# Patient Record
Sex: Male | Born: 1955
Health system: Southern US, Community
[De-identification: ages and names within clinical notes are randomized; demographics above are authoritative.]

## PROBLEM LIST (undated history)

## (undated) DIAGNOSIS — K219 Gastro-esophageal reflux disease without esophagitis: Secondary | ICD-10-CM

## (undated) DIAGNOSIS — L309 Dermatitis, unspecified: Secondary | ICD-10-CM

## (undated) DIAGNOSIS — G20 Parkinson's disease: Secondary | ICD-10-CM

## (undated) DIAGNOSIS — C801 Malignant (primary) neoplasm, unspecified: Secondary | ICD-10-CM

## (undated) DIAGNOSIS — K921 Melena: Secondary | ICD-10-CM

## (undated) DIAGNOSIS — G20A1 Parkinson's disease without dyskinesia, without mention of fluctuations: Secondary | ICD-10-CM

## (undated) DIAGNOSIS — T7840XA Allergy, unspecified, initial encounter: Secondary | ICD-10-CM

## (undated) HISTORY — PX: CATARACT EXTRACTION: SUR2

## (undated) HISTORY — DX: Melena: K92.1

## (undated) HISTORY — DX: Allergy, unspecified, initial encounter: T78.40XA

## (undated) HISTORY — DX: Dermatitis, unspecified: L30.9

## (undated) HISTORY — DX: Malignant (primary) neoplasm, unspecified: C80.1

---
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
## 1984-11-26 HISTORY — PX: HEMORROIDECTOMY: SUR656

## 2003-10-05 ENCOUNTER — Emergency Department (HOSPITAL_COMMUNITY): Admission: EM | Admit: 2003-10-05 | Discharge: 2003-10-05 | Payer: Self-pay | Admitting: Emergency Medicine

## 2009-11-26 HISTORY — PX: PROSTATE SURGERY: SHX751

## 2010-11-26 DIAGNOSIS — C801 Malignant (primary) neoplasm, unspecified: Secondary | ICD-10-CM

## 2010-11-26 HISTORY — DX: Malignant (primary) neoplasm, unspecified: C80.1

## 2011-12-21 ENCOUNTER — Encounter: Payer: Self-pay | Admitting: Family Medicine

## 2011-12-21 ENCOUNTER — Ambulatory Visit (INDEPENDENT_AMBULATORY_CARE_PROVIDER_SITE_OTHER): Payer: 59 | Admitting: Family Medicine

## 2011-12-21 VITALS — BP 100/68 | HR 60 | Temp 97.8°F | Resp 12 | Ht 70.0 in | Wt 218.0 lb

## 2011-12-21 DIAGNOSIS — Z0000 Encounter for general adult medical examination without abnormal findings: Secondary | ICD-10-CM

## 2011-12-21 DIAGNOSIS — Z8546 Personal history of malignant neoplasm of prostate: Secondary | ICD-10-CM

## 2011-12-21 LAB — POCT URINALYSIS DIPSTICK
Bilirubin, UA: NEGATIVE
Blood, UA: NEGATIVE
Glucose, UA: NEGATIVE
Ketones, UA: NEGATIVE
Leukocytes, UA: NEGATIVE
Nitrite, UA: NEGATIVE
Protein, UA: NEGATIVE
Spec Grav, UA: 1.025
Urobilinogen, UA: 0.2
pH, UA: 6

## 2011-12-21 LAB — LIPID PANEL
Cholesterol: 204 mg/dL — ABNORMAL HIGH (ref 0–200)
HDL: 42.4 mg/dL (ref >39.00)
Total CHOL/HDL Ratio: 5
Triglycerides: 160 mg/dL — ABNORMAL HIGH (ref 0.0–149.0)
VLDL: 32 mg/dL (ref 0.0–40.0)

## 2011-12-21 LAB — CBC WITH DIFFERENTIAL/PLATELET
Basophils Absolute: 0 10*3/uL (ref 0.0–0.1)
Basophils Relative: 0.4 % (ref 0.0–3.0)
Eosinophils Absolute: 0.1 10*3/uL (ref 0.0–0.7)
Eosinophils Relative: 0.9 % (ref 0.0–5.0)
HCT: 44.2 % (ref 39.0–52.0)
Hemoglobin: 15.5 g/dL (ref 13.0–17.0)
Lymphocytes Relative: 30.9 % (ref 12.0–46.0)
Lymphs Abs: 1.8 10*3/uL (ref 0.7–4.0)
MCHC: 35.2 g/dL (ref 30.0–36.0)
MCV: 93.6 fl (ref 78.0–100.0)
Monocytes Absolute: 0.5 10*3/uL (ref 0.1–1.0)
Monocytes Relative: 7.9 % (ref 3.0–12.0)
Neutro Abs: 3.6 10*3/uL (ref 1.4–7.7)
Neutrophils Relative %: 59.9 % (ref 43.0–77.0)
Platelets: 246 10*3/uL (ref 150.0–400.0)
RBC: 4.72 Mil/uL (ref 4.22–5.81)
RDW: 13.4 % (ref 11.5–14.6)
WBC: 6 10*3/uL (ref 4.5–10.5)

## 2011-12-21 LAB — BASIC METABOLIC PANEL
BUN: 17 mg/dL (ref 6–23)
CO2: 30 mEq/L (ref 19–32)
Calcium: 9.2 mg/dL (ref 8.4–10.5)
Chloride: 103 mEq/L (ref 96–112)
Creatinine, Ser: 1 mg/dL (ref 0.4–1.5)
GFR: 85.06 mL/min (ref >60.00)
Glucose, Bld: 101 mg/dL — ABNORMAL HIGH (ref 70–99)
Potassium: 4.1 mEq/L (ref 3.5–5.1)
Sodium: 139 mEq/L (ref 135–145)

## 2011-12-21 LAB — HEPATIC FUNCTION PANEL
ALT: 38 U/L (ref 0–53)
AST: 24 U/L (ref 0–37)
Albumin: 4.1 g/dL (ref 3.5–5.2)
Alkaline Phosphatase: 54 U/L (ref 39–117)
Bilirubin, Direct: 0 mg/dL (ref 0.0–0.3)
Total Bilirubin: 1 mg/dL (ref 0.3–1.2)
Total Protein: 7.1 g/dL (ref 6.0–8.3)

## 2011-12-21 LAB — TSH: TSH: 0.97 u[IU]/mL (ref 0.35–5.50)

## 2011-12-21 LAB — LDL CHOLESTEROL, DIRECT: Direct LDL: 133.6 mg/dL

## 2011-12-21 NOTE — Progress Notes (Signed)
  Subjective:    Patient ID: James Collier, male    DOB: [birth year], 56 y.o.   MRN: [id number]  HPI  New patient to establish care for complete physical. Past medical history reviewed. History prostate cancer diagnosed 2011. Status post prostatectomy. Followed by urology. No other chronic medical problems. Remote history of hemorrhoidal surgery. Takes no medications. Nonsmoker. Occasional alcohol use.  Family history is noted for mother died of pancreatic cancer. Father with question of psoriatic arthritis.  Patient is married. Pharmacist, hospital. No smoking history as above. No consistent exercise.  Past Medical History  Diagnosis Date  . Blood in stool   . Cancer 2012    prostate  . Allergy    Past Surgical History  Procedure Date  . Tonsillectomy 1965  . Prostate surgery 2011  . Hemorroidectomy 1986    reports that he has never smoked. He does not have any smokeless tobacco history on file. His alcohol and drug histories not on file. family history includes Arthritis in his father and Cancer in his maternal aunt, mother, and paternal grandmother. Allergies  Allergen Reactions  . Aloe     rash     Review of Systems  Constitutional: Negative for fever, activity change, appetite change and fatigue.  HENT: Negative for ear pain, congestion and trouble swallowing.   Eyes: Negative for pain and visual disturbance.  Respiratory: Negative for cough, shortness of breath and wheezing.   Cardiovascular: Negative for chest pain and palpitations.  Gastrointestinal: Negative for nausea, vomiting, abdominal pain, diarrhea, constipation, blood in stool, abdominal distention and rectal pain.  Genitourinary: Negative for dysuria, hematuria and testicular pain.  Musculoskeletal: Negative for joint swelling and arthralgias.  Skin: Negative for rash.  Neurological: Negative for dizziness, syncope and headaches.  Hematological: Negative for adenopathy.  Psychiatric/Behavioral: Negative for  confusion and dysphoric mood.       Objective:   Physical Exam  Constitutional: He is oriented to person, place, and time. He appears well-developed and well-nourished. No distress.  HENT:  Head: Normocephalic and atraumatic.  Right Ear: External ear normal.  Left Ear: External ear normal.  Mouth/Throat: Oropharynx is clear and moist.  Eyes: Conjunctivae and EOM are normal. Pupils are equal, round, and reactive to light.  Neck: Normal range of motion. Neck supple. No thyromegaly present.  Cardiovascular: Normal rate, regular rhythm and normal heart sounds.   No murmur heard. Pulmonary/Chest: No respiratory distress. He has no wheezes. He has no rales.  Abdominal: Soft. Bowel sounds are normal. He exhibits no distension and no mass. There is no tenderness. There is no rebound and no guarding.  Genitourinary: Rectum normal.       Previous prostatectomy  Musculoskeletal: He exhibits no edema.  Lymphadenopathy:    He has no cervical adenopathy.  Neurological: He is alert and oriented to person, place, and time. He displays normal reflexes. No cranial nerve deficit.  Skin: No rash noted.  Psychiatric: He has a normal mood and affect.          Assessment & Plan:  Complete physical. Patient has history of prostate cancer followed by urology so we'll not check PSA today. Confirm date of last tetanus. Colonoscopy recommended and patient not ready this time. Obtain screening lab work. Establish more consistent exercise.

## 2011-12-21 NOTE — Patient Instructions (Signed)
Consider colonoscopy later this year.

## 2011-12-25 NOTE — Progress Notes (Signed)
Quick Note:  Pt informed on personally identified VM ______ 

## 2012-05-24 ENCOUNTER — Encounter (HOSPITAL_BASED_OUTPATIENT_CLINIC_OR_DEPARTMENT_OTHER): Payer: Self-pay | Admitting: *Deleted

## 2012-05-24 ENCOUNTER — Emergency Department (HOSPITAL_BASED_OUTPATIENT_CLINIC_OR_DEPARTMENT_OTHER): Payer: 59

## 2012-05-24 ENCOUNTER — Emergency Department (HOSPITAL_BASED_OUTPATIENT_CLINIC_OR_DEPARTMENT_OTHER)
Admission: EM | Admit: 2012-05-24 | Discharge: 2012-05-24 | Disposition: A | Discharge status: Home / Self Care | Payer: 59 | Attending: Emergency Medicine | Admitting: Emergency Medicine

## 2012-05-24 DIAGNOSIS — R109 Unspecified abdominal pain: Secondary | ICD-10-CM | POA: Insufficient documentation

## 2012-05-24 DIAGNOSIS — R079 Chest pain, unspecified: Secondary | ICD-10-CM | POA: Insufficient documentation

## 2012-05-24 DIAGNOSIS — Y999 Unspecified external cause status: Secondary | ICD-10-CM | POA: Insufficient documentation

## 2012-05-24 DIAGNOSIS — S2249XA Multiple fractures of ribs, unspecified side, initial encounter for closed fracture: Secondary | ICD-10-CM | POA: Insufficient documentation

## 2012-05-24 DIAGNOSIS — R1011 Right upper quadrant pain: Secondary | ICD-10-CM | POA: Insufficient documentation

## 2012-05-24 DIAGNOSIS — S2239XA Fracture of one rib, unspecified side, initial encounter for closed fracture: Secondary | ICD-10-CM

## 2012-05-24 LAB — CBC WITH DIFFERENTIAL/PLATELET
Basophils Absolute: 0 10*3/uL (ref 0.0–0.1)
Basophils Relative: 0 % (ref 0–1)
Eosinophils Absolute: 0 10*3/uL (ref 0.0–0.7)
Eosinophils Relative: 0 % (ref 0–5)
HCT: 40.6 % (ref 39.0–52.0)
Hemoglobin: 14.7 g/dL (ref 13.0–17.0)
Lymphocytes Relative: 18 % (ref 12–46)
Lymphs Abs: 1.9 10*3/uL (ref 0.7–4.0)
MCH: 31.9 pg (ref 26.0–34.0)
MCHC: 36.2 g/dL — ABNORMAL HIGH (ref 30.0–36.0)
MCV: 88.1 fL (ref 78.0–100.0)
Monocytes Absolute: 0.9 10*3/uL (ref 0.1–1.0)
Monocytes Relative: 9 % (ref 3–12)
Neutro Abs: 7.3 10*3/uL (ref 1.7–7.7)
Neutrophils Relative %: 73 % (ref 43–77)
Platelets: 235 10*3/uL (ref 150–400)
RBC: 4.61 MIL/uL (ref 4.22–5.81)
RDW: 12.8 % (ref 11.5–15.5)
WBC: 10.1 10*3/uL (ref 4.0–10.5)

## 2012-05-24 LAB — BASIC METABOLIC PANEL
BUN: 16 mg/dL (ref 6–23)
CO2: 29 mEq/L (ref 19–32)
Calcium: 10 mg/dL (ref 8.4–10.5)
Chloride: 100 mEq/L (ref 96–112)
Creatinine, Ser: 1.3 mg/dL (ref 0.50–1.35)
GFR calc Af Amer: 69 mL/min — ABNORMAL LOW (ref >90)
GFR calc non Af Amer: 60 mL/min — ABNORMAL LOW (ref >90)
Glucose, Bld: 93 mg/dL (ref 70–99)
Potassium: 3.9 mEq/L (ref 3.5–5.1)
Sodium: 138 mEq/L (ref 135–145)

## 2012-05-24 MED ORDER — IBUPROFEN 800 MG PO TABS: 800.0000 mg | ORAL_TABLET | Freq: Three times a day (TID) | ORAL | Status: AC

## 2012-05-24 MED ORDER — ONDANSETRON HCL 4 MG/2ML IJ SOLN
4.0000 mg | Freq: Once | INTRAMUSCULAR | Status: AC
  Administered 2012-05-24: 4 mg via INTRAVENOUS
  Filled 2012-05-24: qty 2

## 2012-05-24 MED ORDER — OXYCODONE-ACETAMINOPHEN 5-325 MG PO TABS
1.0000 | ORAL_TABLET | Freq: Once | ORAL | Status: AC
  Administered 2012-05-24: 1 via ORAL
  Filled 2012-05-24: qty 1

## 2012-05-24 MED ORDER — IOHEXOL 300 MG/ML  SOLN
100.0000 mL | Freq: Once | INTRAMUSCULAR | Status: AC | PRN
  Administered 2012-05-24: 100 mL via INTRAVENOUS

## 2012-05-24 MED ORDER — ONDANSETRON 8 MG PO TBDP: 8.0000 mg | ORAL_TABLET | Freq: Three times a day (TID) | ORAL | Status: AC | PRN

## 2012-05-24 MED ORDER — SODIUM CHLORIDE 0.9 % IV SOLN
INTRAVENOUS | Status: DC
  Administered 2012-05-24: 20:00:00 via INTRAVENOUS

## 2012-05-24 MED ORDER — OXYCODONE-ACETAMINOPHEN 5-325 MG PO TABS: 1.0000 | ORAL_TABLET | ORAL | Status: AC | PRN

## 2012-05-24 MED ORDER — HYDROMORPHONE HCL PF 1 MG/ML IJ SOLN
1.0000 mg | Freq: Once | INTRAMUSCULAR | Status: AC
  Administered 2012-05-24: 1 mg via INTRAVENOUS
  Filled 2012-05-24: qty 1
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
## 2012-05-24 NOTE — Discharge Instructions (Signed)
   Rib Fracture Your caregiver has diagnosed you as having a rib fracture (a break). This can occur by a blow to the chest, by a fall against a hard object, or by violent coughing or sneezing. There may be one or many breaks. Rib fractures may heal on their own within 3 to 8 weeks. The longer healing period is usually associated with a continued cough or other aggravating activities. HOME CARE INSTRUCTIONS   Avoid strenuous activity. Be careful during activities and avoid bumping the injured rib. Activities that cause pain pull on the fracture site(s) and are best avoided if possible.   Eat a normal, well-balanced diet. Drink plenty of fluids to avoid constipation.   Take deep breaths several times a day to keep lungs free of infection. Try to cough several times a day, splinting the injured area with a pillow. This will help prevent pneumonia.   Do not wear a rib belt or binder. These restrict breathing which can lead to pneumonia.   Only take over-the-counter or prescription medicines for pain, discomfort, or fever as directed by your caregiver.  SEEK MEDICAL CARE IF:  You develop a continual cough, associated with thick or bloody sputum. SEEK IMMEDIATE MEDICAL CARE IF:   You have a fever.   You have difficulty breathing.   You have nausea (feeling sick to your stomach), vomiting, or abdominal (belly) pain.   You have worsening pain, not controlled with medications.  Document Released: 11/12/2005 Document Revised: 11/01/2011 Document Reviewed: 04/16/2007 [facility] Patient Information 2012 [place], [place].Rib Fracture Your caregiver has diagnosed you as having a rib fracture (a break). This can occur by a blow to the chest, by a fall against a hard object, or by violent coughing or sneezing. There may be one or many breaks. Rib fractures may heal on their own within 3 to 8 weeks. The longer healing period is usually associated with a continued cough or other aggravating activities. HOME  CARE INSTRUCTIONS   Avoid strenuous activity. Be careful during activities and avoid bumping the injured rib. Activities that cause pain pull on the fracture site(s) and are best avoided if possible.   Eat a normal, well-balanced diet. Drink plenty of fluids to avoid constipation.   Take deep breaths several times a day to keep lungs free of infection. Try to cough several times a day, splinting the injured area with a pillow. This will help prevent pneumonia.   Do not wear a rib belt or binder. These restrict breathing which can lead to pneumonia.   Only take over-the-counter or prescription medicines for pain, discomfort, or fever as directed by your caregiver.  SEEK MEDICAL CARE IF:  You develop a continual cough, associated with thick or bloody sputum. SEEK IMMEDIATE MEDICAL CARE IF:   You have a fever.   You have difficulty breathing.   You have nausea (feeling sick to your stomach), vomiting, or abdominal (belly) pain.   You have worsening pain, not controlled with medications.  Document Released: 11/12/2005 Document Revised: 11/01/2011 Document Reviewed: 04/16/2007 [facility] Patient Information 2012 [place], [place].   Please take pain medicine as needed and use incentive spirometer as needed.  Recheck with Dr. Caryl Never this week.
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
## 2012-05-27 ENCOUNTER — Telehealth (HOSPITAL_BASED_OUTPATIENT_CLINIC_OR_DEPARTMENT_OTHER): Payer: Self-pay | Admitting: *Deleted

## 2012-05-27 NOTE — ED Notes (Signed)
Patient called requesting a note to be out of work.  Chart reviewed by Dr. Algis Downs. Judd Lien.  OK to give patient 7 days off and will need to follow up with his Primary Care doctor for additional time off and clearance to return to work.

## 2012-05-30 ENCOUNTER — Encounter: Payer: Self-pay | Admitting: Family Medicine

## 2012-05-30 ENCOUNTER — Ambulatory Visit (INDEPENDENT_AMBULATORY_CARE_PROVIDER_SITE_OTHER): Payer: 59 | Admitting: Family Medicine

## 2012-05-30 VITALS — BP 130/80 | Temp 98.1°F | Wt 228.0 lb

## 2012-05-30 DIAGNOSIS — S2249XA Multiple fractures of ribs, unspecified side, initial encounter for closed fracture: Secondary | ICD-10-CM

## 2012-05-30 NOTE — Patient Instructions (Addendum)
Rib Fracture Your caregiver has diagnosed you as having a rib fracture (a break). This can occur by a blow to the chest, by a fall against a hard object, or by violent coughing or sneezing. There may be one or many breaks. Rib fractures may heal on their own within 3 to 8 weeks. The longer healing period is usually associated with a continued cough or other aggravating activities. HOME CARE INSTRUCTIONS   Avoid strenuous activity. Be careful during activities and avoid bumping the injured rib. Activities that cause pain pull on the fracture site(s) and are best avoided if possible.   Eat a normal, well-balanced diet. Drink plenty of fluids to avoid constipation.   Take deep breaths several times a day to keep lungs free of infection. Try to cough several times a day, splinting the injured area with a pillow. This will help prevent pneumonia.   Do not wear a rib belt or binder. These restrict breathing which can lead to pneumonia.   Only take over-the-counter or prescription medicines for pain, discomfort, or fever as directed by your caregiver.  SEEK MEDICAL CARE IF:  You develop a continual cough, associated with thick or bloody sputum. SEEK IMMEDIATE MEDICAL CARE IF:   You have a fever.   You have difficulty breathing.   You have nausea (feeling sick to your stomach), vomiting, or abdominal (belly) pain.   You have worsening pain, not controlled with medications.  Document Released: 11/12/2005 Document Revised: 11/01/2011 Document Reviewed: 04/16/2007 [employer] Patient Information 2012 [employer]. 
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
## 2012-06-09 ENCOUNTER — Telehealth: Payer: Self-pay | Admitting: Family Medicine

## 2012-06-09 ENCOUNTER — Encounter: Payer: Self-pay | Admitting: *Deleted

## 2012-06-09 NOTE — Telephone Encounter (Signed)
Letter written signed, pt informed Rx ready to pick-up tomorrow

## 2012-06-09 NOTE — Telephone Encounter (Signed)
Pt states his ribs are feeling better and need a release back to work for this Wednesday the 17th. Please contact when ready to pick up

## 2012-06-09 NOTE — Telephone Encounter (Signed)
OK to send back on the 17th.

## 2012-07-29 ENCOUNTER — Encounter: Payer: Self-pay | Admitting: Family Medicine

## 2012-07-29 ENCOUNTER — Ambulatory Visit (INDEPENDENT_AMBULATORY_CARE_PROVIDER_SITE_OTHER): Payer: 59 | Admitting: Family Medicine

## 2012-07-29 VITALS — BP 122/90 | Temp 98.7°F | Wt 221.0 lb

## 2012-07-29 DIAGNOSIS — J069 Acute upper respiratory infection, unspecified: Secondary | ICD-10-CM

## 2012-07-29 MED ORDER — HYDROCODONE-HOMATROPINE 5-1.5 MG/5ML PO SYRP: 5.0000 mL | ORAL_SOLUTION | Freq: Four times a day (QID) | ORAL | Status: AC | PRN

## 2012-07-29 NOTE — Progress Notes (Signed)
  Subjective:    Patient ID: James Collier, male    DOB: 10/23/1956, 56 y.o.   MRN: [id number]  HPI  Patient seen with acute upper respiratory infection. Started last Wednesday with sore throat. Subsequent developed nasal congestion and postnasal drip. Now has cough productive of brown sputum. No fever. No dyspnea. Increased lethargy. Poor sleep quality secondary to coughing. Nonsmoker.   Review of Systems  Constitutional: Positive for fatigue. Negative for fever and chills.  HENT: Positive for congestion, postnasal drip and sinus pressure.   Respiratory: Positive for cough.   Neurological: Negative for headaches.       Objective:   Physical Exam  Constitutional: He appears well-developed and well-nourished. No distress.  HENT:  Right Ear: External ear normal.  Left Ear: External ear normal.  Mouth/Throat: Oropharynx is clear and moist.  Neck: Neck supple.  Cardiovascular: Normal rate and regular rhythm.   Pulmonary/Chest: Effort normal and breath sounds normal. No respiratory distress. He has no wheezes. He has no rales.  Lymphadenopathy:    He has no cervical adenopathy.          Assessment & Plan:  Acute upper respiratory infection. Suspect viral. Hycodan cough syrup for nighttime use. This point no antibiotics indicated but followup promptly for any fever or worsening symptoms

## 2012-07-29 NOTE — Patient Instructions (Signed)

## 2013-01-24 ENCOUNTER — Ambulatory Visit (INDEPENDENT_AMBULATORY_CARE_PROVIDER_SITE_OTHER): Payer: 59 | Admitting: Family Medicine

## 2013-01-24 ENCOUNTER — Encounter: Payer: Self-pay | Admitting: Family Medicine

## 2013-01-24 VITALS — BP 140/90 | HR 95 | Temp 97.7°F | Resp 20 | Wt 222.0 lb

## 2013-01-24 DIAGNOSIS — J209 Acute bronchitis, unspecified: Secondary | ICD-10-CM

## 2013-01-24 MED ORDER — AZITHROMYCIN 250 MG PO TABS: ORAL_TABLET | ORAL | Status: DC

## 2013-01-24 NOTE — Progress Notes (Signed)
  Subjective:    Patient ID: James Collier, male    DOB: 05/11/1956, 57 y.o.   MRN: [id number]  HPI Here for 5 days of PND, chest congestion and coughing up green sputum. No fever.    Review of Systems  Constitutional: Negative.   HENT: Positive for postnasal drip. Negative for congestion and sinus pressure.   Eyes: Negative.   Respiratory: Positive for cough and chest tightness.        Objective:   Physical Exam  Constitutional: He appears well-developed and well-nourished. No distress.  HENT:  Right Ear: External ear normal.  Left Ear: External ear normal.  Nose: Nose normal.  Mouth/Throat: Oropharynx is clear and moist.  Eyes: Conjunctivae are normal.  Pulmonary/Chest: Effort normal. No respiratory distress. He has no wheezes. He has no rales.  Scattered rhonchi   Lymphadenopathy:    He has no cervical adenopathy.          Assessment & Plan:  Add Mucinex.

## 2013-02-13 ENCOUNTER — Encounter: Payer: Self-pay | Admitting: Family

## 2013-02-13 ENCOUNTER — Ambulatory Visit (INDEPENDENT_AMBULATORY_CARE_PROVIDER_SITE_OTHER): Payer: 59 | Admitting: Family

## 2013-02-13 VITALS — BP 110/78 | HR 76 | Wt 223.0 lb

## 2013-02-13 DIAGNOSIS — L259 Unspecified contact dermatitis, unspecified cause: Secondary | ICD-10-CM

## 2013-02-13 DIAGNOSIS — L299 Pruritus, unspecified: Secondary | ICD-10-CM

## 2013-02-13 MED ORDER — PREDNISONE 20 MG PO TABS: ORAL_TABLET | ORAL | Status: DC

## 2013-02-13 NOTE — Progress Notes (Signed)
Subjective:    Patient ID: James Collier, male    DOB: 1956-03-17, 57 y.o.   MRN: [id number]  HPI  57 year old white male, nonsmoker, patient of Dr. Caryl Never is in today with complaints of a rash on both eyes x2-3 days. Describes it as red and itchy. He has been eating strawberries lately and he knows he is allergic to strawberries but typically develops a rash on his face. He also had a Z-Pak and is unsure is allergic to it. Patient has also changed laundry detergents, cat sleeps in his bed, and travels a lot sleeping of multiple hotels.  Patient also reports chest congestion that seems to be resolving. However, he would like for me to take a listen today.  Review of Systems  Constitutional: Negative.   Respiratory: Negative.   Cardiovascular: Positive for chest pain.  Gastrointestinal: Negative.   Musculoskeletal: Negative.   Skin: Positive for rash.  Allergic/Immunologic: Positive for environmental allergies and food allergies.  Neurological: Negative.   Hematological: Negative.   Psychiatric/Behavioral: Negative.    Past Medical History  Diagnosis Date  . Blood in stool   . Cancer 2012    prostate  . Allergy     History   Social History  . Marital Status: Married    Spouse Name: N/A    Number of Children: N/A  . Years of Education: N/A   Occupational History  . Not on file.   Social History Main Topics  . Smoking status: Never Smoker   . Smokeless tobacco: Not on file  . Alcohol Use: Yes  . Drug Use: No  . Sexually Active: Not on file   Other Topics Concern  . Not on file   Social History Narrative  . No narrative on file    Past Surgical History  Procedure Laterality Date  . Tonsillectomy  1965  . Prostate surgery  2011  . Hemorroidectomy  1986    Family History  Problem Relation Age of Onset  . Cancer Mother     pancreatic  . Arthritis Father     ?psoriatic  . Cancer Maternal Aunt     esophgeal  . Cancer Paternal Grandmother     ovarian     Allergies  Allergen Reactions  . Aloe     rash    Current Outpatient Prescriptions on File Prior to Visit  Medication Sig Dispense Refill  . azithromycin (ZITHROMAX) 250 MG tablet As directed  6 tablet  0  . guaiFENesin (MUCINEX) 600 MG 12 hr tablet Take 1,200 mg by mouth 2 (two) times daily.      Marland Kitchen ibuprofen (ADVIL,MOTRIN) 200 MG tablet Take 400 mg by mouth every 6 (six) hours as needed. For pain      . naproxen sodium (ANAPROX) 220 MG tablet Take 440 mg by mouth once as needed. For pain       No current facility-administered medications on file prior to visit.    BP 110/78  Pulse 76  Wt 223 lb (101.152 kg)  BMI 31.12 kg/m2  SpO2 97%chart    Objective:   Physical Exam  Constitutional: He is oriented to person, place, and time. He appears well-nourished.  Neck: Neck supple.  Cardiovascular: Normal rate, regular rhythm and normal heart sounds.   Pulmonary/Chest: Effort normal and breath sounds normal.  Neurological: He is alert and oriented to person, place, and time.  Skin: Skin is warm. Rash noted.  Dry, flaky, excoriated rash noted to the anterior and posterior thighs bilaterally.  No drainage or discharge.  Psychiatric: He has a normal mood and affect.          Assessment & Plan:  Assessment:  1. Contact dermatitis 2. Pruritus 3. Upper respiratory infection that is resolving  Plan: Prednisone x20 mg tablet 60x3, 40x3, 20x3. Patient call the office is symptoms worsen or persist. Recheck as scheduled, and as needed.

## 2013-02-13 NOTE — Patient Instructions (Addendum)

## 2013-04-10 ENCOUNTER — Other Ambulatory Visit (INDEPENDENT_AMBULATORY_CARE_PROVIDER_SITE_OTHER): Payer: 59

## 2013-04-10 ENCOUNTER — Encounter: Payer: Self-pay | Admitting: Family Medicine

## 2013-04-10 DIAGNOSIS — Z0000 Encounter for general adult medical examination without abnormal findings: Secondary | ICD-10-CM

## 2013-04-10 LAB — CBC WITH DIFFERENTIAL/PLATELET
Basophils Absolute: 0 10*3/uL (ref 0.0–0.1)
Basophils Relative: 0.4 % (ref 0.0–3.0)
Eosinophils Absolute: 0.1 10*3/uL (ref 0.0–0.7)
Eosinophils Relative: 1.3 % (ref 0.0–5.0)
HCT: 44.4 % (ref 39.0–52.0)
Hemoglobin: 15.5 g/dL (ref 13.0–17.0)
Lymphocytes Relative: 30.8 % (ref 12.0–46.0)
Lymphs Abs: 1.8 10*3/uL (ref 0.7–4.0)
MCHC: 34.9 g/dL (ref 30.0–36.0)
MCV: 91.8 fl (ref 78.0–100.0)
Monocytes Absolute: 0.5 10*3/uL (ref 0.1–1.0)
Monocytes Relative: 9.3 % (ref 3.0–12.0)
Neutro Abs: 3.4 10*3/uL (ref 1.4–7.7)
Neutrophils Relative %: 58.2 % (ref 43.0–77.0)
Platelets: 240 10*3/uL (ref 150.0–400.0)
RBC: 4.84 Mil/uL (ref 4.22–5.81)
RDW: 13.8 % (ref 11.5–14.6)
WBC: 5.9 10*3/uL (ref 4.5–10.5)

## 2013-04-10 LAB — TSH: TSH: 1.61 u[IU]/mL (ref 0.35–5.50)

## 2013-04-10 LAB — POCT URINALYSIS DIPSTICK
Bilirubin, UA: NEGATIVE
Blood, UA: NEGATIVE
Glucose, UA: NEGATIVE
Ketones, UA: NEGATIVE
Leukocytes, UA: NEGATIVE
Nitrite, UA: NEGATIVE
Protein, UA: NEGATIVE
Spec Grav, UA: 1.01
Urobilinogen, UA: 0.2
pH, UA: 7

## 2013-04-10 LAB — LIPID PANEL
Cholesterol: 203 mg/dL — ABNORMAL HIGH (ref 0–200)
HDL: 36.5 mg/dL — ABNORMAL LOW (ref >39.00)
Total CHOL/HDL Ratio: 6
Triglycerides: 215 mg/dL — ABNORMAL HIGH (ref 0.0–149.0)
VLDL: 43 mg/dL — ABNORMAL HIGH (ref 0.0–40.0)

## 2013-04-10 LAB — BASIC METABOLIC PANEL
BUN: 12 mg/dL (ref 6–23)
CO2: 29 mEq/L (ref 19–32)
Calcium: 9.6 mg/dL (ref 8.4–10.5)
Chloride: 103 mEq/L (ref 96–112)
Creatinine, Ser: 1.1 mg/dL (ref 0.4–1.5)
GFR: 76.42 mL/min (ref >60.00)
Glucose, Bld: 96 mg/dL (ref 70–99)
Potassium: 4.2 mEq/L (ref 3.5–5.1)
Sodium: 138 mEq/L (ref 135–145)

## 2013-04-10 LAB — LDL CHOLESTEROL, DIRECT: Direct LDL: 128.9 mg/dL

## 2013-04-10 LAB — PSA: PSA: 0 ng/mL — ABNORMAL LOW (ref 0.10–4.00)

## 2013-04-10 LAB — HEPATIC FUNCTION PANEL
ALT: 44 U/L (ref 0–53)
AST: 27 U/L (ref 0–37)
Albumin: 3.8 g/dL (ref 3.5–5.2)
Alkaline Phosphatase: 50 U/L (ref 39–117)
Bilirubin, Direct: 0.1 mg/dL (ref 0.0–0.3)
Total Bilirubin: 0.9 mg/dL (ref 0.3–1.2)
Total Protein: 6.3 g/dL (ref 6.0–8.3)

## 2013-04-13 ENCOUNTER — Ambulatory Visit (INDEPENDENT_AMBULATORY_CARE_PROVIDER_SITE_OTHER): Payer: 59 | Admitting: Family Medicine

## 2013-04-13 ENCOUNTER — Encounter: Payer: Self-pay | Admitting: Family Medicine

## 2013-04-13 VITALS — BP 104/68 | HR 72 | Temp 98.7°F | Resp 12 | Ht 71.0 in | Wt 223.0 lb

## 2013-04-13 DIAGNOSIS — Z23 Encounter for immunization: Secondary | ICD-10-CM

## 2013-04-13 DIAGNOSIS — Z0000 Encounter for general adult medical examination without abnormal findings: Secondary | ICD-10-CM

## 2013-04-13 NOTE — Progress Notes (Signed)
  Subjective:    Patient ID: James Collier, male    DOB: [birth year], 57 y.o.   MRN: [id number]  HPI Patient seen for complete physical. History of prostate cancer 2011 with previous prostatectomy. He's never had any recent screening colonoscopy. Apparently had colonoscopy in his 71s for hemorrhoids. No consistent exercise. Nonsmoker. Takes no regular prescription medications. Last tetanus unknown and probably over 20 years ago  Past Medical History  Diagnosis Date  . Blood in stool   . Cancer 2012    prostate  . Allergy    Past Surgical History  Procedure Laterality Date  . Tonsillectomy  1965  . Prostate surgery  2011  . Hemorroidectomy  1986    reports that he has never smoked. He does not have any smokeless tobacco history on file. He reports that  drinks alcohol. He reports that he does not use illicit drugs. family history includes Arthritis in his father and Cancer in his maternal aunt, mother, and paternal grandmother. Allergies  Allergen Reactions  . Aloe     rash      Review of Systems  Constitutional: Negative for fever, activity change, appetite change, fatigue and unexpected weight change.  HENT: Negative for ear pain, congestion and trouble swallowing.   Eyes: Negative for pain and visual disturbance.  Respiratory: Negative for cough, shortness of breath and wheezing.   Cardiovascular: Negative for chest pain and palpitations.  Gastrointestinal: Negative for nausea, vomiting, abdominal pain, diarrhea, constipation, blood in stool, abdominal distention and rectal pain.  Genitourinary: Negative for dysuria, hematuria and testicular pain.  Musculoskeletal: Negative for joint swelling and arthralgias.  Skin: Negative for rash.  Neurological: Negative for dizziness, syncope and headaches.  Hematological: Negative for adenopathy.  Psychiatric/Behavioral: Positive for dysphoric mood. Negative for confusion.       Objective:   Physical Exam  Constitutional: He  is oriented to person, place, and time. He appears well-developed and well-nourished. No distress.  HENT:  Head: Normocephalic and atraumatic.  Right Ear: External ear normal.  Left Ear: External ear normal.  Mouth/Throat: Oropharynx is clear and moist.  Eyes: Conjunctivae and EOM are normal. Pupils are equal, round, and reactive to light.  Neck: Normal range of motion. Neck supple. No thyromegaly present.  Cardiovascular: Normal rate, regular rhythm and normal heart sounds.   No murmur heard. Pulmonary/Chest: No respiratory distress. He has no wheezes. He has no rales.  Abdominal: Soft. Bowel sounds are normal. He exhibits no distension and no mass. There is no tenderness. There is no rebound and no guarding.  Musculoskeletal: He exhibits no edema.  Lymphadenopathy:    He has no cervical adenopathy.  Neurological: He is alert and oriented to person, place, and time. He displays normal reflexes. No cranial nerve deficit.  Skin: No rash noted.  Left shoulder reveals about 6 mm minimally asymmetric light brown pigmented lesion  Psychiatric: He has a normal mood and affect.          Assessment & Plan:  Complete physical. Labs reviewed. Tetanus booster given. Set up screening colonoscopy. Recommend dermatology followup regarding left shoulder lesion. We explained that we would be happy to biopsy and remove if he is unable to get in to see his dermatologist over the next 3-4 weeks. Try to establish more consistent exercise

## 2013-04-13 NOTE — Patient Instructions (Signed)
Set up appointment with dermatologist regarding left shoulder lesion Tried establish more consistent exercise We will call you regarding colonoscopy appointment

## 2013-07-14 ENCOUNTER — Encounter: Payer: Self-pay | Admitting: Gastroenterology

## 2013-09-11 ENCOUNTER — Other Ambulatory Visit (INDEPENDENT_AMBULATORY_CARE_PROVIDER_SITE_OTHER): Payer: 59 | Admitting: Family Medicine

## 2013-09-11 ENCOUNTER — Ambulatory Visit (INDEPENDENT_AMBULATORY_CARE_PROVIDER_SITE_OTHER): Payer: 59

## 2013-09-11 DIAGNOSIS — Z23 Encounter for immunization: Secondary | ICD-10-CM

## 2014-09-10 ENCOUNTER — Other Ambulatory Visit: Payer: Self-pay

## 2015-07-08 ENCOUNTER — Ambulatory Visit (INDEPENDENT_AMBULATORY_CARE_PROVIDER_SITE_OTHER): Payer: 59 | Admitting: Family Medicine

## 2015-07-08 ENCOUNTER — Encounter: Payer: Self-pay | Admitting: Family Medicine

## 2015-07-08 VITALS — BP 110/70 | HR 74 | Temp 97.8°F | Wt 219.0 lb

## 2015-07-08 DIAGNOSIS — B356 Tinea cruris: Secondary | ICD-10-CM | POA: Diagnosis not present

## 2015-07-08 MED ORDER — NYSTATIN 100000 UNIT/GM EX CREA: 1.0000 "application " | TOPICAL_CREAM | Freq: Two times a day (BID) | CUTANEOUS | Status: DC

## 2015-07-08 MED ORDER — FLUCONAZOLE 100 MG PO TABS: 100.0000 mg | ORAL_TABLET | Freq: Every day | ORAL | Status: DC

## 2015-07-08 NOTE — Progress Notes (Signed)
   Subjective:    Patient ID: James Collier, male    DOB: 1956-11-21, 59 y.o.   MRN: [id number]  HPI Patient seen with file week history of pruritic erythematous rash groin region bilaterally. Sometimes itches and sometimes burns. He's tried to keep this as dry as possible. He used some type of anabiotic cream initially without improvement and then switch to some type of over-the-counter anti-fungal cream. No fever or chills. No history of diabetes. No recent anti-biotics. No recent prednisone use  Past Medical History  Diagnosis Date  . Blood in stool   . Cancer 2012    prostate  . Allergy    Past Surgical History  Procedure Laterality Date  . Tonsillectomy  1965  . Prostate surgery  2011  . Hemorroidectomy  1986    reports that he has never smoked. He does not have any smokeless tobacco history on file. He reports that he drinks alcohol. He reports that he does not use illicit drugs. family history includes Arthritis in his father; Cancer in his maternal aunt, mother, and paternal grandmother. Allergies  Allergen Reactions  . Aloe     rash      Review of Systems  Constitutional: Negative for fever and chills.  Skin: Positive for rash.       Objective:   Physical Exam  Constitutional: He appears well-developed and well-nourished.  Cardiovascular: Normal rate and regular rhythm.   Pulmonary/Chest: Effort normal and breath sounds normal. No respiratory distress. He has no wheezes. He has no rales.  Skin: Rash noted.          Assessment & Plan:  Tenia cruris. Keep area dry as possible. Nystatin cream twice daily. Fluconazole 100 mg once daily for 7 days-since you are he tried some type of over-the-counter anti-fungal without success. Touch base in 2 weeks if not improving

## 2015-07-08 NOTE — Progress Notes (Signed)
Pre visit review using our clinic review tool, if applicable. No additional management support is needed unless otherwise documented below in the visit note. 

## 2015-09-02 ENCOUNTER — Other Ambulatory Visit (INDEPENDENT_AMBULATORY_CARE_PROVIDER_SITE_OTHER): Payer: 59

## 2015-09-02 DIAGNOSIS — Z0000 Encounter for general adult medical examination without abnormal findings: Secondary | ICD-10-CM

## 2015-09-02 LAB — CBC WITH DIFFERENTIAL/PLATELET
BASOS PCT: 0.5 % (ref 0.0–3.0)
Basophils Absolute: 0 10*3/uL (ref 0.0–0.1)
EOS PCT: 1.1 % (ref 0.0–5.0)
Eosinophils Absolute: 0.1 10*3/uL (ref 0.0–0.7)
HEMATOCRIT: 46.1 % (ref 39.0–52.0)
Hemoglobin: 15.7 g/dL (ref 13.0–17.0)
LYMPHS ABS: 1.5 10*3/uL (ref 0.7–4.0)
Lymphocytes Relative: 28.9 % (ref 12.0–46.0)
MCHC: 34 g/dL (ref 30.0–36.0)
MCV: 91.9 fl (ref 78.0–100.0)
MONOS PCT: 7.7 % (ref 3.0–12.0)
Monocytes Absolute: 0.4 10*3/uL (ref 0.1–1.0)
NEUTROS ABS: 3.3 10*3/uL (ref 1.4–7.7)
NEUTROS PCT: 61.8 % (ref 43.0–77.0)
PLATELETS: 248 10*3/uL (ref 150.0–400.0)
RBC: 5.02 Mil/uL (ref 4.22–5.81)
RDW: 13.4 % (ref 11.5–15.5)
WBC: 5.3 10*3/uL (ref 4.0–10.5)

## 2015-09-02 LAB — BASIC METABOLIC PANEL
BUN: 16 mg/dL (ref 6–23)
CHLORIDE: 103 meq/L (ref 96–112)
CO2: 27 mEq/L (ref 19–32)
Calcium: 9.4 mg/dL (ref 8.4–10.5)
Creatinine, Ser: 0.99 mg/dL (ref 0.40–1.50)
GFR: 82.01 mL/min (ref >60.00)
GLUCOSE: 96 mg/dL (ref 70–99)
POTASSIUM: 4.1 meq/L (ref 3.5–5.1)
SODIUM: 138 meq/L (ref 135–145)

## 2015-09-02 LAB — HEPATIC FUNCTION PANEL
ALBUMIN: 4.1 g/dL (ref 3.5–5.2)
ALT: 23 U/L (ref 0–53)
AST: 15 U/L (ref 0–37)
Alkaline Phosphatase: 55 U/L (ref 39–117)
Bilirubin, Direct: 0.2 mg/dL (ref 0.0–0.3)
TOTAL PROTEIN: 6.7 g/dL (ref 6.0–8.3)
Total Bilirubin: 0.9 mg/dL (ref 0.2–1.2)

## 2015-09-02 LAB — PSA: PSA: 0 ng/mL — ABNORMAL LOW (ref 0.10–4.00)

## 2015-09-02 LAB — TSH: TSH: 1.2 u[IU]/mL (ref 0.35–4.50)

## 2015-09-02 LAB — LIPID PANEL
CHOLESTEROL: 200 mg/dL (ref 0–200)
HDL: 38.5 mg/dL — AB (ref >39.00)
LDL Cholesterol: 127 mg/dL — ABNORMAL HIGH (ref 0–99)
NonHDL: 161.27
Total CHOL/HDL Ratio: 5
Triglycerides: 173 mg/dL — ABNORMAL HIGH (ref 0.0–149.0)
VLDL: 34.6 mg/dL (ref 0.0–40.0)

## 2015-09-12 ENCOUNTER — Other Ambulatory Visit: Payer: 59 | Admitting: Family Medicine

## 2015-09-19 ENCOUNTER — Ambulatory Visit (INDEPENDENT_AMBULATORY_CARE_PROVIDER_SITE_OTHER): Payer: 59 | Admitting: Family Medicine

## 2015-09-19 ENCOUNTER — Encounter: Payer: Self-pay | Admitting: Family Medicine

## 2015-09-19 VITALS — BP 120/80 | HR 70 | Temp 97.7°F | Ht 71.0 in | Wt 222.9 lb

## 2015-09-19 DIAGNOSIS — Z0000 Encounter for general adult medical examination without abnormal findings: Secondary | ICD-10-CM | POA: Diagnosis not present

## 2015-09-19 DIAGNOSIS — Z23 Encounter for immunization: Secondary | ICD-10-CM | POA: Diagnosis not present

## 2015-09-19 NOTE — Progress Notes (Signed)
   Subjective:    Patient ID: James Collier, male    DOB: 08-29-1956, 59 y.o.   MRN: [id number]  HPI Patient is here for complete physical. Generally very healthy. He does have history of obesity and remote history of prostate cancer. Prostatectomy many years ago. Takes no medications. Tetanus is up-to-date. Needs flu vaccination. Has never had screening colonoscopy since turning age 55. He had remote colonoscopy sometime in his 60s. Generally feels well no specific complaints. He walks some for exercise  Past Medical History  Diagnosis Date  . Blood in stool   . Cancer [facility]) 2012    prostate  . Allergy    Past Surgical History  Procedure Laterality Date  . Tonsillectomy  1965  . Prostate surgery  2011  . Hemorroidectomy  1986    reports that he has never smoked. He does not have any smokeless tobacco history on file. He reports that he drinks alcohol. He reports that he does not use illicit drugs. family history includes Arthritis in his father; Cancer in his maternal aunt, mother, and paternal grandmother. Allergies  Allergen Reactions  . Aloe     rash      Review of Systems  Constitutional: Negative for fever, activity change, appetite change and fatigue.  HENT: Negative for congestion, ear pain and trouble swallowing.   Eyes: Negative for pain and visual disturbance.  Respiratory: Negative for cough, shortness of breath and wheezing.   Cardiovascular: Negative for chest pain and palpitations.  Gastrointestinal: Negative for nausea, vomiting, abdominal pain, diarrhea, constipation, blood in stool, abdominal distention and rectal pain.  Genitourinary: Negative for dysuria, hematuria and testicular pain.  Musculoskeletal: Positive for arthralgias. Negative for joint swelling.  Skin: Negative for rash.  Neurological: Negative for dizziness, syncope and headaches.  Hematological: Negative for adenopathy.  Psychiatric/Behavioral: Negative for confusion and dysphoric mood.        Objective:   Physical Exam  Constitutional: He is oriented to person, place, and time. He appears well-developed and well-nourished. No distress.  HENT:  Head: Normocephalic and atraumatic.  Right Ear: External ear normal.  Left Ear: External ear normal.  Mouth/Throat: Oropharynx is clear and moist.  Eyes: Conjunctivae and EOM are normal. Pupils are equal, round, and reactive to light.  Neck: Normal range of motion. Neck supple. No thyromegaly present.  Cardiovascular: Normal rate, regular rhythm and normal heart sounds.   No murmur heard. Pulmonary/Chest: No respiratory distress. He has no wheezes. He has no rales.  Abdominal: Soft. Bowel sounds are normal. He exhibits no distension and no mass. There is no tenderness. There is no rebound and no guarding.  Musculoskeletal: He exhibits no edema.  Lymphadenopathy:    He has no cervical adenopathy.  Neurological: He is alert and oriented to person, place, and time. He displays normal reflexes. No cranial nerve deficit.  Skin: No rash noted.  Psychiatric: He has a normal mood and affect.          Assessment & Plan:  Complete physical. Labs reviewed. No major concerns. He has high triglycerides and low HDL but stable. We discussed importance of weight loss. Establish more consistent exercise. Schedule screening colonoscopy. Flu vaccine given.

## 2015-09-19 NOTE — Progress Notes (Signed)
Pre visit review using our clinic review tool, if applicable. No additional management support is needed unless otherwise documented below in the visit note. 

## 2016-02-22 ENCOUNTER — Telehealth: Payer: Self-pay | Admitting: Family Medicine

## 2016-02-22 NOTE — Telephone Encounter (Signed)
Patient Name: James Collier  DOB: 1956-07-28    Initial Comment Caller states has a pain in the chest- it has been persistent- potential acid reflux-    Nurse Assessment  Nurse: Julien Girt, RN, Almyra Free Date/Time Eilene Ghazi Time): 02/22/2016 8:21:05 AM  Confirm and document reason for call. If symptomatic, describe symptoms. You must click the next button to save text entered. ---Caller states she has had chest pain since Monday, he believes it is related to GERD. States this pain wakes him during the night, he takes Nexium and is able to go back to sleep. The pain does come and go, is present now but he denies sob.  Has the patient traveled out of the country within the last 30 days? ---Not Applicable  Does the patient have any new or worsening symptoms? ---Yes  Will a triage be completed? ---Yes  Related visit to physician within the last 2 weeks? ---No  Does the PT have any chronic conditions? (i.e. diabetes, asthma, etc.) ---No  Is this a behavioral health or substance abuse call? ---No     Guidelines    Guideline Title Affirmed Question Affirmed Notes  Chest Pain [1] Chest pain lasts > 5 minutes AND [2] age > 40    Final Disposition User   Call EMS 911 Now Julien Girt, RN, Almyra Free    Referrals  [facility] REFUSED   Disagree/Comply: Disagree  Disagree/Comply Reason: Disagree with instructions

## 2016-02-22 NOTE — Telephone Encounter (Signed)
If he refuses to go and to be evaluated immediately with at least set up follow-up here. In the meantime I would suggest that he take Nexium regularly and add OTC Zantac or Pepcid twice daily until follow-up

## 2016-02-22 NOTE — Telephone Encounter (Signed)
Message printed and put on your desk.

## 2016-02-22 NOTE — Telephone Encounter (Signed)
Pt has made an appt for 02/23/16.  Advised to take Nexium regularly and can pick up OTC Zantac or Pepcid twice daily.   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  Needed to be seen ASAP.

## 2016-02-23 ENCOUNTER — Ambulatory Visit (INDEPENDENT_AMBULATORY_CARE_PROVIDER_SITE_OTHER): Payer: 59 | Admitting: Family Medicine

## 2016-02-23 ENCOUNTER — Encounter: Payer: Self-pay | Admitting: Family Medicine

## 2016-02-23 VITALS — BP 138/90 | HR 75 | Temp 97.8°F | Ht 71.0 in | Wt 224.0 lb

## 2016-02-23 DIAGNOSIS — R079 Chest pain, unspecified: Secondary | ICD-10-CM

## 2016-02-23 DIAGNOSIS — K219 Gastro-esophageal reflux disease without esophagitis: Secondary | ICD-10-CM

## 2016-02-23 NOTE — Patient Instructions (Addendum)
Food Choices for Gastroesophageal Reflux Disease, Adult When you have gastroesophageal reflux disease (GERD), the foods you eat and your eating habits are very important. Choosing the right foods can help ease the discomfort of GERD. WHAT GENERAL GUIDELINES DO I NEED TO FOLLOW?  Choose fruits, vegetables, whole grains, low-fat dairy products, and low-fat meat, fish, and poultry.  Limit fats such as oils, salad dressings, butter, nuts, and avocado.  Keep a food diary to identify foods that cause symptoms.  Avoid foods that cause reflux. These may be different for different people.  Eat frequent small meals instead of three large meals each day.  Eat your meals slowly, in a relaxed setting.  Limit fried foods.  Cook foods using methods other than frying.  Avoid drinking alcohol.  Avoid drinking large amounts of liquids with your meals.  Avoid bending over or lying down until 2-3 hours after eating. WHAT FOODS ARE NOT RECOMMENDED? The following are some foods and drinks that may worsen your symptoms: Vegetables Tomatoes. Tomato juice. Tomato and spaghetti sauce. Chili peppers. Onion and garlic. Horseradish. Fruits Oranges, grapefruit, and lemon (fruit and juice). Meats High-fat meats, fish, and poultry. This includes hot dogs, ribs, ham, sausage, salami, and bacon. Dairy Whole milk and chocolate milk. Sour cream. Cream. Butter. Ice cream. Cream cheese.  Beverages Coffee and tea, with or without caffeine. Carbonated beverages or energy drinks. Condiments Hot sauce. Barbecue sauce.  Sweets/Desserts Chocolate and cocoa. Donuts. Peppermint and spearmint. Fats and Oils High-fat foods, including [place] fries and potato chips. Other Vinegar. Strong spices, such as black pepper, white pepper, red pepper, cayenne, curry powder, cloves, ginger, and chili powder. The items listed above may not be a complete list of foods and beverages to avoid. Contact your dietitian for more  information.   This information is not intended to replace advice given to you by your health care provider. Make sure you discuss any questions you have with your health care provider.   Document Released: 11/12/2005 Document Revised: 12/03/2014 Document Reviewed: 09/16/2013 Elsevier Interactive Patient Education 2016 Else[employer] Inc[employer]  Increase Nexium to 40 mg daily Continue for now with Pepcid twice daily Let me know if any difficulty with swallowing. Will call you with stress test.

## 2016-02-23 NOTE — Progress Notes (Signed)
   Subjective:    Patient ID: James Collier, male    DOB: 08-Sep-1956, 60 y.o.   MRN: [id number]  HPI    Patient comes in today with past few nights he's had some episodes of "burning" in his left chest region. Woke up out of sleep one time with this. Occasional daytime GERD symptoms. He talked to triage nurse yesterday and they suggested ER evaluation and patient declined. He has not had any exertional symptoms whatsoever. Earlier today he push mowed for few hours without any discomfort. He also walks briskly without any recent symptoms.   He's been taking recently some over-the-counter Nexium 20 mg daily and added some Pepcid yesterday which did not seem to help. He took a dose of baking soda which seemed to help. He has occasional solid food dysphagia. No recent appetite or weight changes. Does drink about 3 cups of coffee per day. Frequent mint consumption. Nonsmoker. No FH of premature CAD.    No cardiac history. No history of diabetes or hypertension.  Denies any recent dyspnea  Past Medical History  Diagnosis Date  . Blood in stool   . Cancer [facility]) 2012    prostate  . Allergy    Past Surgical History  Procedure Laterality Date  . Tonsillectomy  1965  . Prostate surgery  2011  . Hemorroidectomy  1986    reports that he has never smoked. He does not have any smokeless tobacco history on file. He reports that he drinks alcohol. He reports that he does not use illicit drugs. family history includes Arthritis in his father; Cancer in his maternal aunt, mother, and paternal grandmother. Allergies  Allergen Reactions  . Aloe     rash      Review of Systems  Constitutional: Negative for appetite change and unexpected weight change.  Respiratory: Negative for cough, shortness of breath and wheezing.   Cardiovascular: Positive for chest pain. Negative for palpitations and leg swelling.  Gastrointestinal: Negative for nausea, vomiting and abdominal pain.  Genitourinary: Negative  for dysuria.  Musculoskeletal: Negative for back pain.  Neurological: Negative for dizziness, syncope and weakness.       Objective:   Physical Exam  Constitutional: He appears well-developed and well-nourished.  HENT:  Mouth/Throat: Oropharynx is clear and moist.  Neck: Neck supple.  Cardiovascular: Normal rate and regular rhythm.   Pulmonary/Chest: Effort normal and breath sounds normal. No respiratory distress. He has no wheezes. He has no rales.  Abdominal: Soft. Bowel sounds are normal. He exhibits no distension and no mass. There is no tenderness. There is no rebound and no guarding.  Lymphadenopathy:    He has no cervical adenopathy.          Assessment & Plan:   Atypical chest pain. Patient described burning type pain which has occurred at rest and never with exertion. Does not have any associated dyspnea or other concerns. Suspect GERD related. Check EKG. EKG NSR with no acute changes. 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   Increase Nexium to 40 mg daily and continue supplement with Pepcid or Zantac.  We discussed possible GI referral for EGD but at this point patient states his dysphagia symptoms are very infrequent and he declines. Dietary factors discussed. Gradually reduce caffeine use. Avoid mint products Elevate head of bed about 6 inches.  Avoid late night eating.

## 2016-03-02 ENCOUNTER — Ambulatory Visit (INDEPENDENT_AMBULATORY_CARE_PROVIDER_SITE_OTHER): Payer: 59 | Admitting: Family Medicine

## 2016-03-02 ENCOUNTER — Ambulatory Visit: Payer: 59 | Admitting: Family Medicine

## 2016-03-02 ENCOUNTER — Encounter: Payer: Self-pay | Admitting: Family Medicine

## 2016-03-02 VITALS — BP 120/70 | HR 78 | Temp 97.7°F | Ht 71.0 in | Wt 222.4 lb

## 2016-03-02 DIAGNOSIS — B029 Zoster without complications: Secondary | ICD-10-CM | POA: Diagnosis not present

## 2016-03-02 NOTE — Progress Notes (Signed)
Pre visit review using our clinic review tool, if applicable. No additional management support is needed unless otherwise documented below in the visit note. 

## 2016-03-02 NOTE — Patient Instructions (Signed)

## 2016-03-02 NOTE — Progress Notes (Signed)
   Subjective:    Patient ID: James Collier, male    DOB: Nov 12, 1956, 60 y.o.   MRN: [id number]  HPI   Patient had recent burning left chest region and atypical chest pain.  Refer to recent note. Last Thursday broke out in a small patch of rash just lateral to his left nipple area and also had some similar pain- though no rash in the back region upper thoracic following a dermatome distribution. No recent exertional chest pains. Also had some GERD symptoms which are improved after taking Zantac regularly.  Past Medical History  Diagnosis Date  . Blood in stool   . Cancer [facility]) 2012    prostate  . Allergy    Past Surgical History  Procedure Laterality Date  . Tonsillectomy  1965  . Prostate surgery  2011  . Hemorroidectomy  1986    reports that he has never smoked. He does not have any smokeless tobacco history on file. He reports that he drinks alcohol. He reports that he does not use illicit drugs. family history includes Arthritis in his father; Cancer in his maternal aunt, mother, and paternal grandmother. Allergies  Allergen Reactions  . Aloe     rash     Review of Systems  Respiratory: Negative for shortness of breath.   Gastrointestinal: Negative for abdominal pain.       Objective:   Physical Exam  Constitutional: He appears well-developed and well-nourished.  Cardiovascular: Normal rate and regular rhythm.   Pulmonary/Chest: Effort normal and breath sounds normal. No respiratory distress. He has no wheezes. He has no rales.  Skin: Rash noted.  Small approximately 2 x 3 cm cluster of dried vesicles about 6 cm lateral to the left nipple region. Nontender palpation          Assessment & Plan:   Shingles left upper thoracic dermatome.  Did not recommend Valtrex since he is several days into this. He has minimal pain at this time. Reassurance given. Would still consider shingles vaccine a few years

## 2016-07-11 ENCOUNTER — Telehealth: Payer: Self-pay | Admitting: Family Medicine

## 2016-09-14 ENCOUNTER — Telehealth: Payer: Self-pay | Admitting: Family Medicine

## 2016-09-14 DIAGNOSIS — Z23 Encounter for immunization: Secondary | ICD-10-CM

## 2016-09-14 DIAGNOSIS — Z1211 Encounter for screening for malignant neoplasm of colon: Secondary | ICD-10-CM

## 2016-09-14 NOTE — Telephone Encounter (Signed)
OK to refer.

## 2016-09-14 NOTE — Telephone Encounter (Signed)
Order entered for patient to have a colonoscopy.

## 2016-09-14 NOTE — Telephone Encounter (Signed)
Okay to order?

## 2016-09-14 NOTE — Telephone Encounter (Signed)
Pt has a cpe in November, but would like referral for colonoscopy

## 2016-09-18 ENCOUNTER — Encounter: Payer: Self-pay | Admitting: Gastroenterology

## 2016-09-21 ENCOUNTER — Ambulatory Visit (INDEPENDENT_AMBULATORY_CARE_PROVIDER_SITE_OTHER): Payer: 59

## 2016-09-21 ENCOUNTER — Other Ambulatory Visit (INDEPENDENT_AMBULATORY_CARE_PROVIDER_SITE_OTHER): Payer: 59

## 2016-09-21 DIAGNOSIS — Z23 Encounter for immunization: Secondary | ICD-10-CM | POA: Diagnosis not present

## 2016-09-21 DIAGNOSIS — R7989 Other specified abnormal findings of blood chemistry: Secondary | ICD-10-CM | POA: Diagnosis not present

## 2016-09-21 DIAGNOSIS — Z0000 Encounter for general adult medical examination without abnormal findings: Secondary | ICD-10-CM | POA: Diagnosis not present

## 2016-09-21 LAB — LIPID PANEL
CHOL/HDL RATIO: 6
Cholesterol: 213 mg/dL — ABNORMAL HIGH (ref 0–200)
HDL: 38 mg/dL — ABNORMAL LOW (ref >39.00)
NONHDL: 175.09
Triglycerides: 243 mg/dL — ABNORMAL HIGH (ref 0.0–149.0)
VLDL: 48.6 mg/dL — ABNORMAL HIGH (ref 0.0–40.0)

## 2016-09-21 LAB — CBC WITH DIFFERENTIAL/PLATELET
Basophils Absolute: 0 10*3/uL (ref 0.0–0.1)
Basophils Relative: 0.4 % (ref 0.0–3.0)
EOS ABS: 0.1 10*3/uL (ref 0.0–0.7)
EOS PCT: 1.3 % (ref 0.0–5.0)
HCT: 45.7 % (ref 39.0–52.0)
HEMOGLOBIN: 15.9 g/dL (ref 13.0–17.0)
LYMPHS PCT: 33.4 % (ref 12.0–46.0)
Lymphs Abs: 2.2 10*3/uL (ref 0.7–4.0)
MCHC: 34.7 g/dL (ref 30.0–36.0)
MCV: 90.6 fl (ref 78.0–100.0)
MONO ABS: 0.5 10*3/uL (ref 0.1–1.0)
Monocytes Relative: 7.9 % (ref 3.0–12.0)
Neutro Abs: 3.7 10*3/uL (ref 1.4–7.7)
Neutrophils Relative %: 57 % (ref 43.0–77.0)
Platelets: 243 10*3/uL (ref 150.0–400.0)
RBC: 5.05 Mil/uL (ref 4.22–5.81)
RDW: 13.4 % (ref 11.5–15.5)
WBC: 6.5 10*3/uL (ref 4.0–10.5)

## 2016-09-21 LAB — HEPATIC FUNCTION PANEL
ALT: 30 U/L (ref 0–53)
AST: 17 U/L (ref 0–37)
Albumin: 4.3 g/dL (ref 3.5–5.2)
Alkaline Phosphatase: 52 U/L (ref 39–117)
BILIRUBIN DIRECT: 0.2 mg/dL (ref 0.0–0.3)
BILIRUBIN TOTAL: 1.1 mg/dL (ref 0.2–1.2)
Total Protein: 6.8 g/dL (ref 6.0–8.3)

## 2016-09-21 LAB — BASIC METABOLIC PANEL
BUN: 15 mg/dL (ref 6–23)
CO2: 30 mEq/L (ref 19–32)
Calcium: 9.7 mg/dL (ref 8.4–10.5)
Chloride: 102 mEq/L (ref 96–112)
Creatinine, Ser: 1.09 mg/dL (ref 0.40–1.50)
GFR: 73.13 mL/min (ref >60.00)
Glucose, Bld: 102 mg/dL — ABNORMAL HIGH (ref 70–99)
POTASSIUM: 4.2 meq/L (ref 3.5–5.1)
Sodium: 138 mEq/L (ref 135–145)

## 2016-09-21 LAB — LDL CHOLESTEROL, DIRECT: LDL DIRECT: 148 mg/dL

## 2016-09-24 LAB — PSA: PSA: 0 ng/mL — AB (ref 0.10–4.00)

## 2016-09-24 LAB — TSH: TSH: 1.56 u[IU]/mL (ref 0.35–4.50)

## 2016-10-02 ENCOUNTER — Ambulatory Visit (INDEPENDENT_AMBULATORY_CARE_PROVIDER_SITE_OTHER): Payer: 59 | Admitting: Family Medicine

## 2016-10-02 ENCOUNTER — Encounter: Payer: Self-pay | Admitting: Family Medicine

## 2016-10-02 VITALS — BP 110/70 | HR 72 | Temp 97.3°F | Ht 71.0 in | Wt 222.2 lb

## 2016-10-02 DIAGNOSIS — Z0000 Encounter for general adult medical examination without abnormal findings: Secondary | ICD-10-CM | POA: Diagnosis not present

## 2016-10-02 NOTE — Progress Notes (Signed)
Subjective:     Patient ID: James Collier, male   DOB: 10-23-1956, 60 y.o.   MRN: [id number]  HPI Patient seen for physical exam. He is scheduled for colonoscopy later this month. He had shingles last year and is not interested in shingles vaccine at this time. His tetanus is up-to-date. Has had flu vaccination. Has seen ophthalmologist in the past year and diagnosed with early macular degeneration. Takes no medications. He works as the Optician, dispensing. Travels usually about 2 times per week.  Past Medical History:  Diagnosis Date  . Allergy   . Blood in stool   . Cancer [facility]) 2012   prostate   Past Surgical History:  Procedure Laterality Date  . HEMORROIDECTOMY  1986  . PROSTATE SURGERY  2011  . TONSILLECTOMY  1965    reports that he has never smoked. He has never used smokeless tobacco. He reports that he drinks alcohol. He reports that he does not use drugs. family history includes Arthritis in his father; Cancer in his maternal aunt, mother, and paternal grandmother; Parkinson's disease in his father. Allergies  Allergen Reactions  . Aloe     rash     Review of Systems  Constitutional: Negative for activity change, appetite change, fatigue and fever.  HENT: Negative for congestion, ear pain and trouble swallowing.   Eyes: Negative for pain and visual disturbance.  Respiratory: Negative for cough, shortness of breath and wheezing.   Cardiovascular: Negative for chest pain and palpitations.  Gastrointestinal: Negative for abdominal distention, abdominal pain, blood in stool, constipation, diarrhea, nausea, rectal pain and vomiting.  Genitourinary: Negative for dysuria, hematuria and testicular pain.  Musculoskeletal: Negative for arthralgias and joint swelling.  Skin: Negative for rash.  Neurological: Negative for dizziness, syncope and headaches.  Hematological: Negative for adenopathy.  Psychiatric/Behavioral: Negative for confusion and dysphoric mood.        Objective:   Physical Exam  Constitutional: He is oriented to person, place, and time. He appears well-developed and well-nourished. No distress.  HENT:  Head: Normocephalic and atraumatic.  Right Ear: External ear normal.  Left Ear: External ear normal.  Mouth/Throat: Oropharynx is clear and moist.  Eyes: Conjunctivae and EOM are normal. Pupils are equal, round, and reactive to light.  Neck: Normal range of motion. Neck supple. No thyromegaly present.  Cardiovascular: Normal rate, regular rhythm and normal heart sounds.   No murmur heard. Pulmonary/Chest: No respiratory distress. He has no wheezes. He has no rales.  Abdominal: Soft. Bowel sounds are normal. He exhibits no distension and no mass. There is no tenderness. There is no rebound and no guarding.  Musculoskeletal: He exhibits no edema.  Lymphadenopathy:    He has no cervical adenopathy.  Neurological: He is alert and oriented to person, place, and time. He displays normal reflexes. No cranial nerve deficit.  Skin: No rash noted.  Benign appearing seborrheic keratoses on the trunk and neck region. No concerning lesions.  Psychiatric: He has a normal mood and affect.       Assessment:     Physical exam. Labs reviewed. He has dyslipidemia. 10 year risk of CAD event 8.6%    Plan:     -Colonoscopy has already been scheduled -Offered shingles vaccine and he declines -Encouraged to lose some weight and establish more consistent exercise -Discussed pros and cons of statin use. He declines at this point. He prefers to lose weight and consider follow-up fasting lipids  [name] MD [facility]

## 2016-10-02 NOTE — Patient Instructions (Signed)
Try to lose some weight Establish more consistent exercise. Schedule if interested in getting seborrheic keratoses treated.

## 2016-10-02 NOTE — Progress Notes (Signed)
Pre visit review using our clinic review tool, if applicable. No additional management support is needed unless otherwise documented below in the visit note. 

## 2016-10-26 ENCOUNTER — Ambulatory Visit (AMBULATORY_SURGERY_CENTER): Payer: Self-pay

## 2016-10-26 VITALS — Ht 70.0 in | Wt 221.6 lb

## 2016-10-26 DIAGNOSIS — Z860100 Personal history of colon polyps, unspecified: Secondary | ICD-10-CM

## 2016-10-26 MED ORDER — SUPREP BOWEL PREP KIT 17.5-3.13-1.6 GM/177ML PO SOLN: 1.0000 | Freq: Once | ORAL | 0 refills | Status: AC

## 2016-10-26 NOTE — Progress Notes (Signed)
No allergies to eggs or soy No past problems with anesthesia No diet meds No home oxygen  Declined emmi 

## 2016-11-15 ENCOUNTER — Ambulatory Visit (AMBULATORY_SURGERY_CENTER): Payer: 59 | Admitting: Gastroenterology

## 2016-11-15 ENCOUNTER — Encounter: Payer: Self-pay | Admitting: Gastroenterology

## 2016-11-15 VITALS — BP 133/86 | HR 71 | Temp 98.6°F | Resp 15 | Ht 70.0 in | Wt 221.0 lb

## 2016-11-15 DIAGNOSIS — Z1212 Encounter for screening for malignant neoplasm of rectum: Secondary | ICD-10-CM | POA: Diagnosis not present

## 2016-11-15 DIAGNOSIS — Z1211 Encounter for screening for malignant neoplasm of colon: Secondary | ICD-10-CM | POA: Diagnosis present

## 2016-11-15 MED ORDER — SODIUM CHLORIDE 0.9 % IV SOLN: 500.0000 mL | INTRAVENOUS | Status: DC

## 2016-11-15 NOTE — Progress Notes (Signed)
A/ox3 pleased with MAC, report to Jane RN 

## 2016-11-15 NOTE — Op Note (Signed)
[place] Patient Name: James Collier Procedure Date: 11/15/2016 7:56 AM MRN: [id number] Endoscopist: Mallie Mussel L. Loletha Carrow , MD Age: 60 Referring MD:  Date of Birth: [birth year] Gender: Male Account #: [id number] Procedure:                Colonoscopy Indications:              Screening for colorectal malignant neoplasm, This                            is the patient's first screening colonoscopy Medicines:                Monitored Anesthesia Care Procedure:                Pre-Anesthesia Assessment:                           - Prior to the procedure, a History and Physical                            was performed, and patient medications and                            allergies were reviewed. The patient's tolerance of                            previous anesthesia was also reviewed. The risks                            and benefits of the procedure and the sedation                            options and risks were discussed with the patient.                            All questions were answered, and informed consent                            was obtained. Prior Anticoagulants: The patient has                            taken no previous anticoagulant or antiplatelet                            agents. ASA Grade Assessment: II - A patient with                            mild systemic disease. After reviewing the risks                            and benefits, the patient was deemed in                            satisfactory condition to undergo the procedure.  After obtaining informed consent, the colonoscope                            was passed under direct vision. Throughout the                            procedure, the patient's blood pressure, pulse, and                            oxygen saturations were monitored continuously. The                            Model CF-HQ190L [id number]) scope was introduced                            through the anus  and advanced to the the cecum,                            identified by appendiceal orifice and ileocecal                            valve. The colonoscopy was performed without                            difficulty. The patient tolerated the procedure                            well. The quality of the bowel preparation was                            excellent. The ileocecal valve, appendiceal                            orifice, and rectum were photographed. The quality                            of the bowel preparation was evaluated using the                            BBPS [facility] Bowel Preparation Scale) with scores                            of: Right Colon = 3, Transverse Colon = 3 and Left                            Colon = 3 (entire mucosa seen well with no residual                            staining, small fragments of stool or opaque                            liquid). The total BBPS score equals 9. The bowel  preparation used was SUPREP. Scope In: 8:02:51 AM Scope Out: 8:15:35 AM Scope Withdrawal Time: 0 hours 10 minutes 23 seconds  Total Procedure Duration: 0 hours 12 minutes 44 seconds  Findings:                 The perianal and digital rectal examinations were                            normal.                           Multiple medium-mouthed diverticula were found in                            the left colon.                           The exam was otherwise without abnormality on                            direct and retroflexion views. Complications:            No immediate complications. Estimated Blood Loss:     Estimated blood loss: none. Impression:               - Diverticulosis in the left colon.                           - The examination was otherwise normal on direct                            and retroflexion views.                           - No specimens collected. Recommendation:           - Patient has a contact number available  for                            emergencies. The signs and symptoms of potential                            delayed complications were discussed with the                            patient. Return to normal activities tomorrow.                            Written discharge instructions were provided to the                            patient.                           - Resume previous diet.                           - Continue present medications.                           -  Repeat colonoscopy in 10 years for screening                            purposes. Henry L. Loletha Carrow, MD 11/15/2016 8:18:50 AM This report has been signed electronically.

## 2016-11-15 NOTE — Patient Instructions (Signed)
Impression/Recommendations:  Diverticulosis handout given to patient.  Repeat colonoscopy in 10 years for screening purposes.  YOU HAD AN ENDOSCOPIC PROCEDURE TODAY AT THE [facility]:   Refer to the procedure report that was given to you for any specific questions about what was found during the examination.  If the procedure report does not answer your questions, please call your gastroenterologist to clarify.  If you requested that your care partner not be given the details of your procedure findings, then the procedure report has been included in a sealed envelope for you to review at your convenience later.  YOU SHOULD EXPECT: Some feelings of bloating in the abdomen. Passage of more gas than usual.  Walking can help get rid of the air that was put into your GI tract during the procedure and reduce the bloating. If you had a lower endoscopy (such as a colonoscopy or flexible sigmoidoscopy) you may notice spotting of blood in your stool or on the toilet paper. If you underwent a bowel prep for your procedure, you may not have a normal bowel movement for a few days.  Please Note:  You might notice some irritation and congestion in your nose or some drainage.  This is from the oxygen used during your procedure.  There is no need for concern and it should clear up in a day or so.  SYMPTOMS TO REPORT IMMEDIATELY:   Following lower endoscopy (colonoscopy or flexible sigmoidoscopy):  Excessive amounts of blood in the stool  Significant tenderness or worsening of abdominal pains  Swelling of the abdomen that is new, acute  Fever of 100F or higher   For urgent or emergent issues, a gastroenterologist can be reached at any hour by calling [phone].   DIET:  We do recommend a small meal at first, but then you may proceed to your regular diet.  Drink plenty of fluids but you should avoid alcoholic beverages for 24 hours.  ACTIVITY:  You should plan to take it easy for the rest  of today and you should NOT DRIVE or use heavy machinery until tomorrow (because of the sedation medicines used during the test).    FOLLOW UP: Our staff will call the number listed on your records the next business day following your procedure to check on you and address any questions or concerns that you may have regarding the information given to you following your procedure. If we do not reach you, we will leave a message.  However, if you are feeling well and you are not experiencing any problems, there is no need to return our call.  We will assume that you have returned to your regular daily activities without incident.  If any biopsies were taken you will be contacted by phone or by letter within the next 1-3 weeks.  Please call us at [phone] if you have not heard about the biopsies in 3 weeks.    SIGNATURES/CONFIDENTIALITY: You and/or your care partner have signed paperwork which will be entered into your electronic medical record.  These signatures attest to the fact that that the information above on your After Visit Summary has been reviewed and is understood.  Full responsibility of the confidentiality of this discharge information lies with you and/or your care-partner. 

## 2016-11-16 ENCOUNTER — Telehealth: Payer: Self-pay | Admitting: *Deleted

## 2016-11-16 ENCOUNTER — Telehealth: Payer: Self-pay

## 2016-11-16 NOTE — Telephone Encounter (Signed)
Opened in error

## 2016-11-16 NOTE — Telephone Encounter (Signed)
  Follow up Call-  Call back number 11/15/2016 11/15/2016  Post procedure Call Back phone  # [phone] -  Permission to leave phone message Yes Yes  Some recent data might be hidden    Patient was called for follow up after his procedure on 11/15/2016. No answer at the number given for follow up phone call. A message was left on the answering machine.

## 2016-11-16 NOTE — Telephone Encounter (Signed)
  Follow up Call-  Call back number 11/15/2016 11/15/2016  Post procedure Call Back phone  # [phone] -  Permission to leave phone message Yes Yes  Some recent data might be hidden     Patient questions:  Do you have a fever, pain , or abdominal swelling? No. Pain Score  0 *  Have you tolerated food without any problems? Yes.    Have you been able to return to your normal activities? Yes.    Do you have any questions about your discharge instructions: Diet   No. Medications  No. Follow up visit  No.  Do you have questions or concerns about your Care? No.  Actions: * If pain score is 4 or above: No action needed, pain <4.

## 2017-08-15 ENCOUNTER — Encounter: Payer: Self-pay | Admitting: Family Medicine

## 2017-08-23 ENCOUNTER — Ambulatory Visit (INDEPENDENT_AMBULATORY_CARE_PROVIDER_SITE_OTHER): Payer: 59 | Admitting: Family Medicine

## 2017-08-23 ENCOUNTER — Encounter: Payer: Self-pay | Admitting: Family Medicine

## 2017-08-23 VITALS — BP 120/80 | HR 93 | Temp 99.1°F | Wt 225.6 lb

## 2017-08-23 DIAGNOSIS — R109 Unspecified abdominal pain: Secondary | ICD-10-CM

## 2017-08-23 LAB — POCT URINALYSIS DIPSTICK
Bilirubin, UA: NEGATIVE
Blood, UA: NEGATIVE
GLUCOSE UA: NEGATIVE
Ketones, UA: NEGATIVE
LEUKOCYTES UA: NEGATIVE
Nitrite, UA: NEGATIVE
Protein, UA: NEGATIVE
Spec Grav, UA: 1.025 (ref 1.010–1.025)
Urobilinogen, UA: 0.2 E.U./dL
pH, UA: 6 (ref 5.0–8.0)

## 2017-08-23 NOTE — Patient Instructions (Signed)

## 2017-08-23 NOTE — Progress Notes (Signed)
Subjective:     Patient ID: James Collier, male   DOB: 1956-04-27, 61 y.o.   MRN: [id number]  HPI Patient seen with right lower abdominal pain. About a week ago he noticed some pain in his right lower lumbar area. No recent injury. He now has some mild pain right lower quadrant though this is somewhat intermittent and fairly mild. No clear exacerbating factors. He took some Advil which may have helped. Denies any fever. No urinary symptoms. No change in bowel habits. Denies any nausea, vomiting, or diarrhea. He had colonoscopy last December which was relatively normal. Appetite is stable. No weight changes.  No exacerbating or alleviating factors.  Past Medical History:  Diagnosis Date  . Allergy   . Blood in stool   . Cancer [facility]) 2012   prostate   Past Surgical History:  Procedure Laterality Date  . HEMORROIDECTOMY  1986  . PROSTATE SURGERY  2011  . TONSILLECTOMY  1965    reports that he has never smoked. He has never used smokeless tobacco. He reports that he drinks about 1.8 oz of alcohol per week . He reports that he does not use drugs. family history includes Arthritis in his father; Cancer in his maternal aunt, maternal grandmother, mother, and paternal grandmother; Ovarian cancer in his paternal grandmother; Parkinson's disease in his father. Allergies  Allergen Reactions  . Aloe     rash     Review of Systems  Constitutional: Negative for appetite change, chills, fever and unexpected weight change.  Respiratory: Negative for shortness of breath.   Cardiovascular: Negative for chest pain.  Gastrointestinal: Positive for abdominal pain. Negative for blood in stool, constipation, diarrhea, nausea and vomiting.  Genitourinary: Negative for dysuria and hematuria.       Objective:   Physical Exam  Constitutional: He appears well-developed and well-nourished.  Cardiovascular: Normal rate and regular rhythm.   Pulmonary/Chest: Effort normal and breath sounds normal. No  respiratory distress. He has no wheezes. He has no rales.  Abdominal: Soft. Bowel sounds are normal. He exhibits no distension and no mass. There is no tenderness. There is no rebound and no guarding.       Assessment:     Abdominal pain right lower quadrant. Given onset of recent flank pain check urinalysis. No evidence for acute abdomen on exam at this time We thought of kidney stone but urine dip shows no blood.    Plan:     -Check urinalysis=normal. -discussed checking CBC and pt declines since pt minimal.  [name] MD [facility] at [facility]

## 2017-09-13 DIAGNOSIS — H524 Presbyopia: Secondary | ICD-10-CM | POA: Diagnosis not present

## 2017-09-13 DIAGNOSIS — H2513 Age-related nuclear cataract, bilateral: Secondary | ICD-10-CM | POA: Diagnosis not present

## 2017-09-13 DIAGNOSIS — D3131 Benign neoplasm of right choroid: Secondary | ICD-10-CM | POA: Diagnosis not present

## 2017-09-13 DIAGNOSIS — H353132 Nonexudative age-related macular degeneration, bilateral, intermediate dry stage: Secondary | ICD-10-CM | POA: Diagnosis not present

## 2017-11-18 ENCOUNTER — Encounter: Payer: 59 | Admitting: Family Medicine

## 2017-12-16 ENCOUNTER — Ambulatory Visit: Payer: 59 | Admitting: Family Medicine

## 2017-12-16 ENCOUNTER — Encounter: Payer: Self-pay | Admitting: Family Medicine

## 2017-12-16 VITALS — BP 110/80 | HR 76 | Temp 98.1°F | Wt 222.6 lb

## 2017-12-16 DIAGNOSIS — J209 Acute bronchitis, unspecified: Secondary | ICD-10-CM

## 2017-12-16 NOTE — Progress Notes (Signed)
Subjective:     Patient ID: James Collier, male   DOB: 22-Jan-1956, 62 y.o.   MRN: [id number]  HPI Patient seen with one-week history of cough. He's had some nasal congestion. Mild sore throat. He thinks he might have had some low-grade fever a few days ago but none past couple days. No dyspnea. No chest pains. No nausea or vomiting. Wife has similar symptoms. Cough has been relatively mild. 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  Past Medical History:  Diagnosis Date  . Allergy   . Blood in stool   . Cancer [facility]) 2012   prostate   Past Surgical History:  Procedure Laterality Date  . HEMORROIDECTOMY  1986  . PROSTATE SURGERY  2011  . TONSILLECTOMY  1965    reports that  has never smoked. he has never used smokeless tobacco. He reports that he drinks about 1.8 oz of alcohol per week. He reports that he does not use drugs. family history includes Arthritis in his father; Cancer in his maternal aunt, maternal grandmother, mother, and paternal grandmother; Ovarian cancer in his paternal grandmother; Parkinson's disease in his father. Allergies  Allergen Reactions  . Aloe     rash     Review of Systems  Constitutional: Negative for chills and fever.  HENT: Positive for congestion.   Respiratory: Positive for cough. Negative for shortness of breath and wheezing.   Cardiovascular: Negative for chest pain.       Objective:   Physical Exam  Constitutional: He appears well-developed and well-nourished.  HENT:  Right Ear: External ear normal.  Left Ear: External ear normal.  Mouth/Throat: Oropharynx is clear and moist. No oropharyngeal exudate.  Neck: Neck supple.  Cardiovascular: Normal rate and regular rhythm.  Pulmonary/Chest: Effort normal and breath sounds normal. No respiratory distress. He has no wheezes. He has no rales.  Lymphadenopathy:    He has no cervical adenopathy.       Assessment:     Acute bronchitis. Suspect viral.  Nonfocal exam    Plan:     -Recommend stay hydrated well and consider over-the-counter Mucinex Follow-up promptly for any fever or change of symptoms or if cough not resolving next couple weeks-  [name] MD [facility] at [facility]

## 2017-12-16 NOTE — Patient Instructions (Signed)

## 2018-03-26 DIAGNOSIS — M25562 Pain in left knee: Secondary | ICD-10-CM | POA: Diagnosis not present

## 2018-05-05 DIAGNOSIS — M25562 Pain in left knee: Secondary | ICD-10-CM | POA: Diagnosis not present

## 2018-05-05 DIAGNOSIS — M62559 Muscle wasting and atrophy, not elsewhere classified, unspecified thigh: Secondary | ICD-10-CM | POA: Diagnosis not present

## 2018-06-10 DIAGNOSIS — H353132 Nonexudative age-related macular degeneration, bilateral, intermediate dry stage: Secondary | ICD-10-CM | POA: Diagnosis not present

## 2018-06-10 DIAGNOSIS — H5203 Hypermetropia, bilateral: Secondary | ICD-10-CM | POA: Diagnosis not present

## 2018-06-10 DIAGNOSIS — H2513 Age-related nuclear cataract, bilateral: Secondary | ICD-10-CM | POA: Diagnosis not present

## 2018-06-10 DIAGNOSIS — H524 Presbyopia: Secondary | ICD-10-CM | POA: Diagnosis not present

## 2018-08-29 ENCOUNTER — Encounter: Payer: Self-pay | Admitting: Family Medicine

## 2018-08-29 ENCOUNTER — Ambulatory Visit (INDEPENDENT_AMBULATORY_CARE_PROVIDER_SITE_OTHER): Payer: 59 | Admitting: Family Medicine

## 2018-08-29 ENCOUNTER — Other Ambulatory Visit: Payer: Self-pay

## 2018-08-29 VITALS — BP 120/78 | HR 61 | Temp 98.3°F | Ht 68.0 in | Wt 218.9 lb

## 2018-08-29 DIAGNOSIS — Z23 Encounter for immunization: Secondary | ICD-10-CM

## 2018-08-29 DIAGNOSIS — Z0000 Encounter for general adult medical examination without abnormal findings: Secondary | ICD-10-CM | POA: Diagnosis not present

## 2018-08-29 DIAGNOSIS — Z125 Encounter for screening for malignant neoplasm of prostate: Secondary | ICD-10-CM

## 2018-08-29 LAB — HEPATIC FUNCTION PANEL
ALBUMIN: 4.4 g/dL (ref 3.5–5.2)
ALT: 20 U/L (ref 0–53)
AST: 15 U/L (ref 0–37)
Alkaline Phosphatase: 52 U/L (ref 39–117)
Bilirubin, Direct: 0.1 mg/dL (ref 0.0–0.3)
Total Bilirubin: 0.8 mg/dL (ref 0.2–1.2)
Total Protein: 6.9 g/dL (ref 6.0–8.3)

## 2018-08-29 LAB — CBC WITH DIFFERENTIAL/PLATELET
BASOS ABS: 0 10*3/uL (ref 0.0–0.1)
BASOS PCT: 0.6 % (ref 0.0–3.0)
Eosinophils Absolute: 0.1 10*3/uL (ref 0.0–0.7)
Eosinophils Relative: 1.8 % (ref 0.0–5.0)
HEMATOCRIT: 46.2 % (ref 39.0–52.0)
Hemoglobin: 16.3 g/dL (ref 13.0–17.0)
Lymphocytes Relative: 25.3 % (ref 12.0–46.0)
Lymphs Abs: 1.5 10*3/uL (ref 0.7–4.0)
MCHC: 35.2 g/dL (ref 30.0–36.0)
MCV: 89.7 fl (ref 78.0–100.0)
Monocytes Absolute: 0.5 10*3/uL (ref 0.1–1.0)
Monocytes Relative: 8.5 % (ref 3.0–12.0)
NEUTROS ABS: 3.7 10*3/uL (ref 1.4–7.7)
NEUTROS PCT: 63.8 % (ref 43.0–77.0)
PLATELETS: 241 10*3/uL (ref 150.0–400.0)
RBC: 5.15 Mil/uL (ref 4.22–5.81)
RDW: 13.5 % (ref 11.5–15.5)
WBC: 5.7 10*3/uL (ref 4.0–10.5)

## 2018-08-29 LAB — LIPID PANEL
CHOL/HDL RATIO: 5
CHOLESTEROL: 197 mg/dL (ref 0–200)
HDL: 37.4 mg/dL — AB (ref >39.00)
NonHDL: 159.65
TRIGLYCERIDES: 207 mg/dL — AB (ref 0.0–149.0)
VLDL: 41.4 mg/dL — AB (ref 0.0–40.0)

## 2018-08-29 LAB — BASIC METABOLIC PANEL
BUN: 13 mg/dL (ref 6–23)
CHLORIDE: 101 meq/L (ref 96–112)
CO2: 33 mEq/L — ABNORMAL HIGH (ref 19–32)
Calcium: 10 mg/dL (ref 8.4–10.5)
Creatinine, Ser: 1.1 mg/dL (ref 0.40–1.50)
GFR: 71.9 mL/min (ref >60.00)
Glucose, Bld: 106 mg/dL — ABNORMAL HIGH (ref 70–99)
POTASSIUM: 4.6 meq/L (ref 3.5–5.1)
SODIUM: 139 meq/L (ref 135–145)

## 2018-08-29 LAB — LDL CHOLESTEROL, DIRECT: LDL DIRECT: 126 mg/dL

## 2018-08-29 LAB — TSH: TSH: 1.78 u[IU]/mL (ref 0.35–4.50)

## 2018-08-29 LAB — PSA: PSA: 0 ng/mL — ABNORMAL LOW (ref 0.10–4.00)

## 2018-08-29 NOTE — Progress Notes (Signed)
Subjective:     Patient ID: James Collier, male   DOB: [birth year], 62 y.o.   MRN: [id number]  HPI Patient here for complete physical.  He still flies private jets for living.  Just got back from [place] last week.  Generally very healthy.  He gets regular eye checkups through his flight physicals.  He does have past history of prostate cancer.  PSAs have consistently been 0.  No history of hepatitis C screening.  He has had clinical shingles but not vaccine.  Needs flu vaccine.  No indications for Pneumovax yet.  Colonoscopy up-to-date  Recent heavy lifting and felt a pain right inguinal region.  No bulge.  Past Medical History:  Diagnosis Date  . Allergy   . Blood in stool   . Cancer [facility]) 2012   prostate   Past Surgical History:  Procedure Laterality Date  . HEMORROIDECTOMY  1986  . PROSTATE SURGERY  2011  . TONSILLECTOMY  1965    reports that he has never smoked. He has never used smokeless tobacco. He reports that he drinks about 3.0 standard drinks of alcohol per week. He reports that he does not use drugs. family history includes Arthritis in his father; Cancer in his maternal aunt, maternal grandmother, mother, and paternal grandmother; Ovarian cancer in his paternal grandmother; Parkinson's disease in his father. Allergies  Allergen Reactions  . Aloe     rash     Review of Systems  Constitutional: Negative for activity change, appetite change, fatigue and fever.  HENT: Negative for congestion, ear pain and trouble swallowing.   Eyes: Negative for pain and visual disturbance.  Respiratory: Negative for cough, shortness of breath and wheezing.   Cardiovascular: Negative for chest pain and palpitations.  Gastrointestinal: Negative for abdominal distention, abdominal pain, blood in stool, constipation, diarrhea, nausea, rectal pain and vomiting.  Genitourinary: Negative for dysuria, hematuria and testicular pain.  Musculoskeletal: Negative for arthralgias and joint  swelling.  Skin: Negative for rash.  Neurological: Negative for dizziness, syncope and headaches.  Hematological: Negative for adenopathy.  Psychiatric/Behavioral: Negative for confusion and dysphoric mood.       Objective:   Physical Exam  Constitutional: He is oriented to person, place, and time. He appears well-developed and well-nourished. No distress.  HENT:  Head: Normocephalic and atraumatic.  Right Ear: External ear normal.  Left Ear: External ear normal.  Mouth/Throat: Oropharynx is clear and moist.  Eyes: Pupils are equal, round, and reactive to light. Conjunctivae and EOM are normal.  Neck: Normal range of motion. Neck supple. No thyromegaly present.  Cardiovascular: Normal rate, regular rhythm and normal heart sounds.  No murmur heard. Pulmonary/Chest: No respiratory distress. He has no wheezes. He has no rales.  Abdominal: Soft. Bowel sounds are normal. He exhibits no distension and no mass. There is no tenderness. There is no rebound and no guarding.  Genitourinary:  Genitourinary Comments: No inguinal hernia  Musculoskeletal: He exhibits no edema.  Lymphadenopathy:    He has no cervical adenopathy.  Neurological: He is alert and oriented to person, place, and time. He displays normal reflexes. No cranial nerve deficit.  Skin: No rash noted.  Psychiatric: He has a normal mood and affect.       Assessment:     Physical exam.  The following health maintenance issues were addressed    Plan:     -Flu vaccine given -Check hepatitis C antibody -Obtain screening labs including PSA -Discussed shingles vaccine and he will check on insurance  coverage  James Post MD [facility] at [facility]

## 2018-08-29 NOTE — Patient Instructions (Signed)
Consider Shingrix vaccine- check with insurance if interested.

## 2018-08-30 LAB — HEPATITIS C ANTIBODY
HEP C AB: NONREACTIVE
SIGNAL TO CUT-OFF: 0.01 (ref <1.00)

## 2018-08-31 ENCOUNTER — Encounter: Payer: Self-pay | Admitting: Family Medicine

## 2018-09-15 DIAGNOSIS — H2513 Age-related nuclear cataract, bilateral: Secondary | ICD-10-CM | POA: Diagnosis not present

## 2018-09-15 DIAGNOSIS — D3131 Benign neoplasm of right choroid: Secondary | ICD-10-CM | POA: Diagnosis not present

## 2018-09-15 DIAGNOSIS — H353132 Nonexudative age-related macular degeneration, bilateral, intermediate dry stage: Secondary | ICD-10-CM | POA: Diagnosis not present

## 2020-05-24 ENCOUNTER — Ambulatory Visit (INDEPENDENT_AMBULATORY_CARE_PROVIDER_SITE_OTHER): Payer: BC Managed Care – PPO | Admitting: Family Medicine

## 2020-05-24 ENCOUNTER — Encounter: Payer: Self-pay | Admitting: Family Medicine

## 2020-05-24 ENCOUNTER — Other Ambulatory Visit: Payer: Self-pay

## 2020-05-24 VITALS — BP 124/74 | HR 65 | Temp 98.0°F | Ht 68.0 in | Wt 210.4 lb

## 2020-05-24 DIAGNOSIS — Z125 Encounter for screening for malignant neoplasm of prostate: Secondary | ICD-10-CM

## 2020-05-24 DIAGNOSIS — Z0000 Encounter for general adult medical examination without abnormal findings: Secondary | ICD-10-CM | POA: Diagnosis not present

## 2020-05-24 LAB — HEPATIC FUNCTION PANEL
ALT: 25 U/L (ref 0–53)
AST: 18 U/L (ref 0–37)
Albumin: 4.1 g/dL (ref 3.5–5.2)
Alkaline Phosphatase: 51 U/L (ref 39–117)
Bilirubin, Direct: 0.1 mg/dL (ref 0.0–0.3)
Total Bilirubin: 0.7 mg/dL (ref 0.2–1.2)
Total Protein: 6.3 g/dL (ref 6.0–8.3)

## 2020-05-24 LAB — CBC WITH DIFFERENTIAL/PLATELET
Basophils Absolute: 0 10*3/uL (ref 0.0–0.1)
Basophils Relative: 0.4 % (ref 0.0–3.0)
Eosinophils Absolute: 0.1 10*3/uL (ref 0.0–0.7)
Eosinophils Relative: 1.2 % (ref 0.0–5.0)
HCT: 42.8 % (ref 39.0–52.0)
Hemoglobin: 15 g/dL (ref 13.0–17.0)
Lymphocytes Relative: 27.7 % (ref 12.0–46.0)
Lymphs Abs: 1.4 10*3/uL (ref 0.7–4.0)
MCHC: 35 g/dL (ref 30.0–36.0)
MCV: 90.7 fl (ref 78.0–100.0)
Monocytes Absolute: 0.5 10*3/uL (ref 0.1–1.0)
Monocytes Relative: 10.7 % (ref 3.0–12.0)
Neutro Abs: 3.1 10*3/uL (ref 1.4–7.7)
Neutrophils Relative %: 60 % (ref 43.0–77.0)
Platelets: 208 10*3/uL (ref 150.0–400.0)
RBC: 4.72 Mil/uL (ref 4.22–5.81)
RDW: 13.1 % (ref 11.5–15.5)
WBC: 5.1 10*3/uL (ref 4.0–10.5)

## 2020-05-24 LAB — LIPID PANEL
Cholesterol: 172 mg/dL (ref 0–200)
HDL: 39.6 mg/dL (ref >39.00)
LDL Cholesterol: 105 mg/dL — ABNORMAL HIGH (ref 0–99)
NonHDL: 132.15
Total CHOL/HDL Ratio: 4
Triglycerides: 135 mg/dL (ref 0.0–149.0)
VLDL: 27 mg/dL (ref 0.0–40.0)

## 2020-05-24 LAB — TSH: TSH: 1.7 u[IU]/mL (ref 0.35–4.50)

## 2020-05-24 LAB — BASIC METABOLIC PANEL
BUN: 15 mg/dL (ref 6–23)
CO2: 30 mEq/L (ref 19–32)
Calcium: 9.5 mg/dL (ref 8.4–10.5)
Chloride: 102 mEq/L (ref 96–112)
Creatinine, Ser: 1.07 mg/dL (ref 0.40–1.50)
GFR: 69.46 mL/min (ref >60.00)
Glucose, Bld: 103 mg/dL — ABNORMAL HIGH (ref 70–99)
Potassium: 4.2 mEq/L (ref 3.5–5.1)
Sodium: 137 mEq/L (ref 135–145)

## 2020-05-24 LAB — PSA: PSA: 0 ng/mL — ABNORMAL LOW (ref 0.10–4.00)

## 2020-05-24 NOTE — Progress Notes (Signed)
Established Patient Office Visit  Subjective:  Patient ID: James Collier, male    DOB: [birth year]  Age: 64 y.o. MRN: [id number]  CC:  Chief Complaint  Patient presents with  . Annual Exam    Pt wants to discuss his aches     HPI  James Collier presents for physical exam.  He retired earlier this year.  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  He takes no medications.  He has had shingles a few years ago.  No history of shingles vaccine.  Has had Covid vaccine.  Tetanus up-to-date.  Colonoscopy up-to-date.  Generally feels well.  He stayed very active since retirement with home projects.  He has lost some weight which he attributes to increased activity.  He is trying to exercise regularly.  No specific complaints.  Does have past history of prostate cancer.  Past Medical History:  Diagnosis Date  . Allergy   . Blood in stool   . Cancer [facility]) 2012   prostate    Past Surgical History:  Procedure Laterality Date  . HEMORROIDECTOMY  1986  . PROSTATE SURGERY  2011  . TONSILLECTOMY  1965    Family History  Problem Relation Age of Onset  . Cancer Mother        pancreatic  . Arthritis Father        ?psoriatic  . Parkinson's disease Father   . Cancer Maternal Aunt        esophgeal  . Cancer Paternal Grandmother        ovarian  . Ovarian cancer Paternal Grandmother   . Cancer Maternal Grandmother   . Colon cancer Neg Hx     Social History   Socioeconomic History  . Marital status: Married    Spouse name: Not on file  . Number of children: Not on file  . Years of education: Not on file  . Highest education level: Not on file  Occupational History  . Not on file  Tobacco Use  . Smoking status: Never Smoker  . Smokeless tobacco: Never Used  Vaping Use  . Vaping Use: Never used  Substance and Sexual Activity  . Alcohol use: Yes    Alcohol/week: 3.0 standard drinks    Types: 3 Cans of beer per week  . Drug use: No  .  Sexual activity: Not on file  Other Topics Concern  . Not on file  Social History Narrative  . Not on file   Social Determinants of Health   Financial Resource Strain:   . Difficulty of Paying Living Expenses:   Food Insecurity:   . Worried About Charity fundraiser in the Last Year:   . Arboriculturist in the Last Year:   Transportation Needs:   . Film/video editor (Medical):   Marland Kitchen Lack of Transportation (Non-Medical):   Physical Activity:   . Days of Exercise per Week:   . Minutes of Exercise per Session:   Stress:   . Feeling of Stress :   Social Connections:   . Frequency of Communication with Friends and Family:   . Frequency of Social Gatherings with Friends and Family:   . Attends Religious Services:   . Active Member of Clubs or Organizations:   . Attends Archivist Meetings:   Marland Kitchen Marital Status:   Intimate Partner Violence:   . Fear of Current or Ex-Partner:   . Emotionally Abused:   .  Physically Abused:   . Sexually Abused:     Outpatient Medications Prior to Visit  Medication Sig Dispense Refill  . ibuprofen (ADVIL,MOTRIN) 200 MG tablet Take 200 mg by mouth every 6 (six) hours as needed.    . Probiotic Product (PROBIOTIC DAILY PO) Take by mouth.     Facility-Administered Medications Prior to Visit  Medication Dose Route Frequency Provider Last Rate Last Admin  . 0.9 %  sodium chloride infusion  500 mL Intravenous Continuous Doran Stabler, MD        Allergies  Allergen Reactions  . Aloe     rash    ROS Review of Systems  Constitutional: Negative for activity change, appetite change, fatigue and fever.  HENT: Negative for congestion, ear pain and trouble swallowing.   Eyes: Negative for pain and visual disturbance.  Respiratory: Negative for cough, shortness of breath and wheezing.   Cardiovascular: Negative for chest pain and palpitations.  Gastrointestinal: Negative for abdominal distention, abdominal pain, blood in stool,  constipation, diarrhea, nausea, rectal pain and vomiting.  Genitourinary: Negative for dysuria, hematuria and testicular pain.  Musculoskeletal: Negative for arthralgias and joint swelling.  Skin: Negative for rash.  Neurological: Negative for dizziness, syncope and headaches.  Hematological: Negative for adenopathy.  Psychiatric/Behavioral: Negative for confusion and dysphoric mood.      Objective:    Physical Exam Constitutional:      General: He is not in acute distress.    Appearance: He is well-developed.  HENT:     Head: Normocephalic and atraumatic.     Right Ear: External ear normal.     Left Ear: External ear normal.  Eyes:     Conjunctiva/sclera: Conjunctivae normal.     Pupils: Pupils are equal, round, and reactive to light.  Neck:     Thyroid: No thyromegaly.  Cardiovascular:     Rate and Rhythm: Normal rate and regular rhythm.     Heart sounds: Normal heart sounds. No murmur heard.   Pulmonary:     Effort: No respiratory distress.     Breath sounds: No wheezing or rales.  Abdominal:     General: Bowel sounds are normal. There is no distension.     Palpations: Abdomen is soft. There is no mass.     Tenderness: There is no abdominal tenderness. There is no guarding or rebound.  Musculoskeletal:     Cervical back: Normal range of motion and neck supple.  Lymphadenopathy:     Cervical: No cervical adenopathy.  Skin:    Findings: No rash.  Neurological:     Mental Status: He is alert and oriented to person, place, and time.     Cranial Nerves: No cranial nerve deficit.     Deep Tendon Reflexes: Reflexes normal.     BP 124/74 (BP Location: Left Arm, Patient Position: Sitting, Cuff Size: Normal)   Pulse 65   Temp 98 F (36.7 C) (Temporal)   Ht 5\' 8"  (1.727 m)   Wt 210 lb 6.4 oz (95.4 kg)   SpO2 97%   BMI 31.99 kg/m  Wt Readings from Last 3 Encounters:  05/24/20 210 lb 6.4 oz (95.4 kg)  08/29/18 218 lb 14.4 oz (99.3 kg)  12/16/17 222 lb 9.6 oz (101  kg)     Health Maintenance Due  Topic Date Due  . HIV Screening  Never done    There are no preventive care reminders to display for this patient.  Lab Results  Component Value Date  TSH 1.78 08/29/2018   Lab Results  Component Value Date   WBC 5.7 08/29/2018   HGB 16.3 08/29/2018   HCT 46.2 08/29/2018   MCV 89.7 08/29/2018   PLT 241.0 08/29/2018   Lab Results  Component Value Date   NA 139 08/29/2018   K 4.6 08/29/2018   CO2 33 (H) 08/29/2018   GLUCOSE 106 (H) 08/29/2018   BUN 13 08/29/2018   CREATININE 1.10 08/29/2018   BILITOT 0.8 08/29/2018   ALKPHOS 52 08/29/2018   AST 15 08/29/2018   ALT 20 08/29/2018   PROT 6.9 08/29/2018   ALBUMIN 4.4 08/29/2018   CALCIUM 10.0 08/29/2018   GFR 71.90 08/29/2018   Lab Results  Component Value Date   CHOL 197 08/29/2018   Lab Results  Component Value Date   HDL 37.40 (L) 08/29/2018   Lab Results  Component Value Date   LDLCALC 127 (H) 09/02/2015   Lab Results  Component Value Date   TRIG 207.0 (H) 08/29/2018   Lab Results  Component Value Date   CHOLHDL 5 08/29/2018   No results found for: HGBA1C    Assessment & Plan:   Physical exam.  Generally healthy 64 year old male.  Remote history of prostate cancer.  We discussed the following health maintenance issues  -Obtain follow-up screening labs including PSA -We discussed Shingrix vaccine and at this point he declines.  He will consider and check insurance coverage -Recommend continued annual flu vaccine -Covid vaccine already given -Continue regular exercise habits  No orders of the defined types were placed in this encounter.   Follow-up: No follow-ups on file.    Carolann Littler, MD

## 2020-05-24 NOTE — Patient Instructions (Signed)
Preventive Care 41-65 Years Old, Male Preventive care refers to lifestyle choices and visits with your health care provider that can promote health and wellness. This includes:  A yearly physical exam. This is also called an annual well check.  Regular dental and eye exams.  Immunizations.  Screening for certain conditions.  Healthy lifestyle choices, such as eating a healthy diet, getting regular exercise, not using drugs or products that contain nicotine and tobacco, and limiting alcohol use. What can I expect for my preventive care visit? Physical exam Your health care provider will check:  Height and weight. These may be used to calculate body mass index (BMI), which is a measurement that tells if you are at a healthy weight.  Heart rate and blood pressure.  Your skin for abnormal spots. Counseling Your health care provider may ask you questions about:  Alcohol, tobacco, and drug use.  Emotional well-being.  Home and relationship well-being.  Sexual activity.  Eating habits.  Work and work Statistician. What immunizations do I need?  Influenza (flu) vaccine  This is recommended every year. Tetanus, diphtheria, and pertussis (Tdap) vaccine  You may need a Td booster every 10 years. Varicella (chickenpox) vaccine  You may need this vaccine if you have not already been vaccinated. Zoster (shingles) vaccine  You may need this after age 64. Measles, mumps, and rubella (MMR) vaccine  You may need at least one dose of MMR if you were born in 1957 or later. You may also need a second dose. Pneumococcal conjugate (PCV13) vaccine  You may need this if you have certain conditions and were not previously vaccinated. Pneumococcal polysaccharide (PPSV23) vaccine  You may need one or two doses if you smoke cigarettes or if you have certain conditions. Meningococcal conjugate (MenACWY) vaccine  You may need this if you have certain conditions. Hepatitis A  vaccine  You may need this if you have certain conditions or if you travel or work in places where you may be exposed to hepatitis A. Hepatitis B vaccine  You may need this if you have certain conditions or if you travel or work in places where you may be exposed to hepatitis B. Haemophilus influenzae type b (Hib) vaccine  You may need this if you have certain risk factors. Human papillomavirus (HPV) vaccine  If recommended by your health care provider, you may need three doses over 6 months. You may receive vaccines as individual doses or as more than one vaccine together in one shot (combination vaccines). Talk with your health care provider about the risks and benefits of combination vaccines. What tests do I need? Blood tests  Lipid and cholesterol levels. These may be checked every 5 years, or more frequently if you are over 60 years old.  Hepatitis C test.  Hepatitis B test. Screening  Lung cancer screening. You may have this screening every year starting at age 43 if you have a 30-pack-year history of smoking and currently smoke or have quit within the past 15 years.  Prostate cancer screening. Recommendations will vary depending on your family history and other risks.  Colorectal cancer screening. All adults should have this screening starting at age 72 and continuing until age 2. Your health care provider may recommend screening at age 14 if you are at increased risk. You will have tests every 1-10 years, depending on your results and the type of screening test.  Diabetes screening. This is done by checking your blood sugar (glucose) after you have not eaten  for a while (fasting). You may have this done every 1-3 years.  Sexually transmitted disease (STD) testing. Follow these instructions at home: Eating and drinking  Eat a diet that includes fresh fruits and vegetables, whole grains, lean protein, and low-fat dairy products.  Take vitamin and mineral supplements as  recommended by your health care provider.  Do not drink alcohol if your health care provider tells you not to drink.  If you drink alcohol: ? Limit how much you have to 0-2 drinks a day. ? Be aware of how much alcohol is in your drink. In the [place] one drink equals one 12 oz bottle of beer (355 mL), one 5 oz glass of wine (148 mL), or one 1 oz glass of hard liquor (44 mL). Lifestyle  Take daily care of your teeth and gums.  Stay active. Exercise for at least 30 minutes on 5 or more days each week.  Do not use any products that contain nicotine or tobacco, such as cigarettes, e-cigarettes, and chewing tobacco. If you need help quitting, ask your health care provider.  If you are sexually active, practice safe sex. Use a condom or other form of protection to prevent STIs (sexually transmitted infections).  Talk with your health care provider about taking a low-dose aspirin every day starting at age 33. What's next?  Go to your health care provider once a year for a well check visit.  Ask your health care provider how often you should have your eyes and teeth checked.  Stay up to date on all vaccines. This information is not intended to replace advice given to you by your health care provider. Make sure you discuss any questions you have with your health care provider. Document Revised: 11/06/2018 Document Reviewed: 11/06/2018 Elsevier Patient Education  [place].  Consider shingles vaccine and let us know if interested.

## 2021-03-27 ENCOUNTER — Encounter: Payer: Self-pay | Admitting: Family Medicine

## 2021-03-27 ENCOUNTER — Other Ambulatory Visit: Payer: Self-pay

## 2021-03-27 ENCOUNTER — Ambulatory Visit (INDEPENDENT_AMBULATORY_CARE_PROVIDER_SITE_OTHER): Payer: Medicare PPO | Admitting: Family Medicine

## 2021-03-27 VITALS — BP 124/62 | HR 56 | Temp 97.9°F | Ht 68.0 in | Wt 211.6 lb

## 2021-03-27 DIAGNOSIS — R5383 Other fatigue: Secondary | ICD-10-CM

## 2021-03-27 DIAGNOSIS — M791 Myalgia, unspecified site: Secondary | ICD-10-CM | POA: Diagnosis not present

## 2021-03-27 DIAGNOSIS — R531 Weakness: Secondary | ICD-10-CM | POA: Diagnosis not present

## 2021-03-27 DIAGNOSIS — R269 Unspecified abnormalities of gait and mobility: Secondary | ICD-10-CM | POA: Diagnosis not present

## 2021-03-27 DIAGNOSIS — Z0000 Encounter for general adult medical examination without abnormal findings: Secondary | ICD-10-CM

## 2021-03-27 LAB — CBC WITH DIFFERENTIAL/PLATELET
Basophils Absolute: 0 10*3/uL (ref 0.0–0.1)
Basophils Relative: 0.5 % (ref 0.0–3.0)
Eosinophils Absolute: 0.1 10*3/uL (ref 0.0–0.7)
Eosinophils Relative: 1.8 % (ref 0.0–5.0)
HCT: 43.5 % (ref 39.0–52.0)
Hemoglobin: 15.2 g/dL (ref 13.0–17.0)
Lymphocytes Relative: 24.5 % (ref 12.0–46.0)
Lymphs Abs: 1.2 10*3/uL (ref 0.7–4.0)
MCHC: 34.9 g/dL (ref 30.0–36.0)
MCV: 90.1 fl (ref 78.0–100.0)
Monocytes Absolute: 0.4 10*3/uL (ref 0.1–1.0)
Monocytes Relative: 8 % (ref 3.0–12.0)
Neutro Abs: 3.1 10*3/uL (ref 1.4–7.7)
Neutrophils Relative %: 65.2 % (ref 43.0–77.0)
Platelets: 199 10*3/uL (ref 150.0–400.0)
RBC: 4.82 Mil/uL (ref 4.22–5.81)
RDW: 13.2 % (ref 11.5–15.5)
WBC: 4.8 10*3/uL (ref 4.0–10.5)

## 2021-03-27 LAB — COMPREHENSIVE METABOLIC PANEL
ALT: 18 U/L (ref 0–53)
AST: 14 U/L (ref 0–37)
Albumin: 4.2 g/dL (ref 3.5–5.2)
Alkaline Phosphatase: 52 U/L (ref 39–117)
BUN: 14 mg/dL (ref 6–23)
CO2: 30 mEq/L (ref 19–32)
Calcium: 9.5 mg/dL (ref 8.4–10.5)
Chloride: 102 mEq/L (ref 96–112)
Creatinine, Ser: 1.09 mg/dL (ref 0.40–1.50)
GFR: 71.29 mL/min (ref >60.00)
Glucose, Bld: 94 mg/dL (ref 70–99)
Potassium: 4.2 mEq/L (ref 3.5–5.1)
Sodium: 139 mEq/L (ref 135–145)
Total Bilirubin: 0.9 mg/dL (ref 0.2–1.2)
Total Protein: 6.5 g/dL (ref 6.0–8.3)

## 2021-03-27 LAB — TSH: TSH: 1.75 u[IU]/mL (ref 0.35–4.50)

## 2021-03-27 LAB — CK: Total CK: 50 U/L (ref 7–232)

## 2021-03-27 LAB — SEDIMENTATION RATE: Sed Rate: 6 mm/hr (ref 0–20)

## 2021-03-27 NOTE — Patient Instructions (Signed)
We will call you with lab results  Will be setting up neurology referral.

## 2021-03-27 NOTE — Progress Notes (Signed)
Established Patient Office Visit  Subjective:  Patient ID: James Collier, male    DOB: February 22, 1956  Age: 65 y.o. MRN: [id number]  CC:  Chief Complaint  Patient presents with  . Annual Exam    No new concerns    HPI James Collier presents for complaints of multiple symptoms which have been progressive over the past year including some changes in gait, general fatigue and lethargy, possible generalized muscle weakness, myalgias.  He was initially scheduled for physical but we decided to address his acute concerns instead.  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  He states he may have had some onset of symptoms even when he was still working but he was hesitant to mention this because of fear this would interfere with his work.  He has seen some progression over the past year.  He is particularly concerned because his father had Parkinson's disease. Even though is not had much tremor if any he has noticed some shuffling gait and increased muscle stiffness.  He does relate some achiness diffusely.  This does not seem to be confined to his neck upper back and trunk.  He has noticed increased fatigue with activity such as prolonged walking or mowing.  No chest pains.  No dyspnea.  Some others have noticed that he seems to be moving slower than usual.  He has had some difficulty with weakness for example getting up from a chair.  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  He feels he had some challenges with dexterity.  For example, he was having difficulty shaving and switch to electric razor instead because of his motor issues.  Denies any localized focal weakness, recent speech changes, headaches, facial weakness, dysphagia, skin rashes  Currently takes no medications.  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  Recalls a lot of tick bites over the past year but no specific rashes and no unexplained fevers.   He does specifically express some concerns about Lyme disease.  Past Medical History:  Diagnosis Date  . Allergy   . Blood in stool   . Cancer [facility]) 2012   prostate    Past Surgical History:  Procedure Laterality Date  . HEMORROIDECTOMY  1986  . PROSTATE SURGERY  2011  . TONSILLECTOMY  1965    Family History  Problem Relation Age of Onset  . Cancer Mother        pancreatic  . Arthritis Father        ?psoriatic  . Parkinson's disease Father   . Cancer Maternal Aunt        esophgeal  . Cancer Paternal Grandmother        ovarian  . Ovarian cancer Paternal Grandmother   . Cancer Maternal Grandmother   . Colon cancer Neg Hx     Social History   Socioeconomic History  . Marital status: Married    Spouse name: Not on file  . Number of children: Not on file  . Years of education: Not on file  . Highest education level: Not on file  Occupational History  . Not on file  Tobacco Use  . Smoking status: Never Smoker  . Smokeless tobacco: Never Used  Vaping Use  . Vaping Use: Never used  Substance and Sexual Activity  . Alcohol use: Yes    Alcohol/week: 3.0 standard drinks  Types: 3 Cans of beer per week  . Drug use: No  . Sexual activity: Not on file  Other Topics Concern  . Not on file  Social History Narrative  . Not on file   Social Determinants of Health   Financial Resource Strain: Not on file  Food Insecurity: Not on file  Transportation Needs: Not on file  Physical Activity: Not on file  Stress: Not on file  Social Connections: Not on file  Intimate Partner Violence: Not on file    Outpatient Medications Prior to Visit  Medication Sig Dispense Refill  . ibuprofen (ADVIL,MOTRIN) 200 MG tablet Take 200 mg by mouth every 6 (six) hours as needed.    . Probiotic Product (PROBIOTIC DAILY PO) Take by mouth.     Facility-Administered Medications Prior to Visit  Medication Dose Route Frequency Provider Last Rate Last Admin  . 0.9 %  sodium chloride  infusion  500 mL Intravenous Continuous Doran Stabler, MD        Allergies  Allergen Reactions  . Aloe     rash    ROS Review of Systems  Constitutional: Positive for fatigue. Negative for appetite change, chills, fever and unexpected weight change.  Respiratory: Negative for cough and shortness of breath.   Cardiovascular: Negative for chest pain and leg swelling.  Gastrointestinal: Negative for abdominal pain, blood in stool, diarrhea, nausea and vomiting.  Genitourinary: Negative for dysuria.  Skin: Negative for rash.  Neurological: Positive for weakness. Negative for dizziness, seizures, syncope, facial asymmetry, speech difficulty, numbness and headaches.  Hematological: Negative for adenopathy. Does not bruise/bleed easily.  Psychiatric/Behavioral: Negative for confusion.      Objective:    Physical Exam Vitals reviewed.  Constitutional:      Appearance: Normal appearance.  Cardiovascular:     Rate and Rhythm: Normal rate and regular rhythm.  Pulmonary:     Effort: Pulmonary effort is normal.     Breath sounds: Normal breath sounds.  Musculoskeletal:     Right lower leg: No edema.     Left lower leg: No edema.  Neurological:     General: No focal deficit present.     Mental Status: He is alert and oriented to person, place, and time.     Cranial Nerves: No cranial nerve deficit.     Sensory: No sensory deficit.     Motor: No weakness.     Coordination: Coordination normal.     Deep Tendon Reflexes: Reflexes normal.     Comments: No significant cogwheel rigidity.  Gait is relatively slow and generally appears very stiff but no significant shuffling gait.  Very subtle tremor both hands at rest.  No focal weakness.  No difficulties with finger-to-nose testing. Deep tendon reflexes are symmetric and present upper and lower extremities     BP 124/62 (BP Location: Left Arm, Patient Position: Sitting, Cuff Size: Normal)   Pulse (!) 56   Temp 97.9 F (36.6 C)  (Oral)   Ht 5\' 8"  (1.727 m)   Wt 211 lb 9.6 oz (96 kg)   SpO2 96%   BMI 32.17 kg/m  Wt Readings from Last 3 Encounters:  03/27/21 211 lb 9.6 oz (96 kg)  05/24/20 210 lb 6.4 oz (95.4 kg)  08/29/18 218 lb 14.4 oz (99.3 kg)     Health Maintenance Due  Topic Date Due  . HIV Screening  Never done  . COVID-19 Vaccine (2 - Booster for [employer] series) 04/24/2020  . PNA vac Low  Risk Adult (2 of 2 - PPSV23) 11/08/2020    There are no preventive care reminders to display for this patient.  Lab Results  Component Value Date   TSH 1.70 05/24/2020   Lab Results  Component Value Date   WBC 5.1 05/24/2020   HGB 15.0 05/24/2020   HCT 42.8 05/24/2020   MCV 90.7 05/24/2020   PLT 208.0 05/24/2020   Lab Results  Component Value Date   NA 137 05/24/2020   K 4.2 05/24/2020   CO2 30 05/24/2020   GLUCOSE 103 (H) 05/24/2020   BUN 15 05/24/2020   CREATININE 1.07 05/24/2020   BILITOT 0.7 05/24/2020   ALKPHOS 51 05/24/2020   AST 18 05/24/2020   ALT 25 05/24/2020   PROT 6.3 05/24/2020   ALBUMIN 4.1 05/24/2020   CALCIUM 9.5 05/24/2020   GFR 69.46 05/24/2020   Lab Results  Component Value Date   CHOL 172 05/24/2020   Lab Results  Component Value Date   HDL 39.60 05/24/2020   Lab Results  Component Value Date   LDLCALC 105 (H) 05/24/2020   Lab Results  Component Value Date   TRIG 135.0 05/24/2020   Lab Results  Component Value Date   CHOLHDL 4 05/24/2020   No results found for: HGBA1C    Assessment & Plan:   Patient presents with at least 1 year history of somewhat nonspecific symptoms of fatigue, myalgias greater than arthralgias, generalized weakness, concerns for possible motor changes and gait changes.  He is specifically concerned about Parkinson's disease as his father had that and he has seen some similar changes in himself.  Doubt PMR.  He also expresses concerns about possible Lyme disease though he does not recall any rash suggestive of erythema migrans and we  explained limitations of antibody testing  -We recommend starting with some lab work including CBC, CMP, TSH, creatinine kinase, sed rate, Lyme antibodies  -We also discussed possible neurology referral-particularly if labs above unrevealing  No orders of the defined types were placed in this encounter.   Follow-up: No follow-ups on file.    Carolann Littler, MD

## 2021-03-28 NOTE — Progress Notes (Signed)
Assessment/Plan:   1.  Parkinsonism.  I suspect that this does represent idiopathic Parkinson's disease.  The patient has tremor, bradykinesia, rigidity and mild postural instability.  -We discussed the diagnosis as well as pathophysiology of the disease.  We discussed treatment options as well as prognostic indicators.  Patient education was provided.  -We discussed that it used to be thought that levodopa would increase risk of melanoma but now it is believed that Parkinsons itself likely increases risk of melanoma. he is to get regular skin checks.  -Greater than 50% of the 60 minute visit was spent in counseling answering questions and talking about what to expect now as well as in the future.  We talked about medication options as well as potential future surgical options.  We talked about safety in the home.  -We decided to add carbidopa/levodopa 25/100.  1/2 tab tid x 1 wk, then 1/2 in am & noon & 1 at night for a week, then 1/2 in am &1 at noon &night for a week, then 1 po tid.  Risks, benefits, side effects and alternative therapies were discussed.  The opportunity to ask questions was given and they were answered to the best of my ability.  The patient expressed understanding and willingness to follow the outlined treatment protocols.  -I will refer the patient [facility] for PT.  We talked about the importance of safe, cardiovascular exercise in Parkinson's disease.  -We discussed community resources in the area including patient support groups and community exercise programs for PD and pt education was provided to the patient.  -He met with my LCSW today.  -Discussed genetic testing for Parkinson's disease.  His father had Parkinson's disease.  He will think about that.  2.  Dysphagia  -mbe will be ordered.   Subjective:   James Collier was seen today in the movement disorders clinic for neurologic consultation at the request of  Burchette, Elberta Fortis, MD.  The consultation is for the evaluation of shuffling gait and muscle stiffness and to rule out Parkinson's disease.  Pt noted sx's x 1 year or so ago, perhaps a bit longer.  Medical records made available to me are reviewed. Pt with wife who supplements hx.   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  He was nervous to mention symptoms at that point in time.  He has a large truck now.  He notices difficulty getting in and out of the truck.  Sx's seems a bit worse with time, energy getting lower  Specific Symptoms:  Tremor: No. Family hx of similar:  Yes.  , Father with Parkinson's Voice: no change Sleep: trouble staying asleep (not necessarily due to urination)  Vivid Dreams:  Yes.    Acting out dreams:  No. (did in his 20's but none in the recent years) Wet Pillows: Yes.    Postural symptoms:  Yes.   , noting trouble in turns and noting that he uses hand rails up and down stairs  Falls?  No. Bradykinesia symptoms: shuffling gait, slow movements and drooling while awake ; trouble getting OOC (some bad knees) but does state that he has a "controlled drop" getting in car Loss of smell:  Yes.   (associates with chemical exposure) Loss of taste:  No. Urinary Incontinence:  No. Difficulty Swallowing:  Yes.   (some with pills and chips) Handwriting, micrographia: Yes.  , signature is smaller Trouble  with ADL's:  Yes.   but did have to switch from a traditional toothbrush to an electric because of motor challenges.  Noting trouble with washing hair rhythmically as well  Trouble buttoning clothing: slower than in the past Depression:  No. Memory changes:  No. Hallucinations:  No.  visual distortions: No. N/V:  No. Lightheaded:  No.  Syncope: No. Diplopia:  No. Dyskinesia:  No. Prior exposure to reglan/antipsychotics: No.    ALLERGIES:   Allergies  Allergen Reactions  . Aloe     rash    CURRENT MEDICATIONS:  Current Outpatient  Medications  Medication Instructions  . ibuprofen (ADVIL) 200 mg, Oral, Every 6 hours PRN  . Multiple Vitamins-Minerals (PRESERVISION AREDS 2 PO) Oral  . Probiotic Product (PROBIOTIC DAILY PO) Oral    Objective:   VITALS:   Vitals:   03/29/21 1016  BP: 112/66  Pulse: 62  SpO2: 96%  Weight: 210 lb (95.3 kg)  Height: 5\' 10"  (1.778 m)    GEN:  The patient appears stated age and is in NAD. HEENT:  Normocephalic, atraumatic.  The mucous membranes are moist. The superficial temporal arteries are without ropiness or tenderness. CV:  RRR Lungs:  CTAB Neck/HEME:  There are no carotid bruits bilaterally.  Neurological examination:  Orientation: The patient is alert and oriented x3.  Cranial nerves: There is good facial symmetry. There is facial hypomimia with lips parted.  Extraocular muscles are intact. The visual fields are full to confrontational testing. The speech is fluent and clear. Soft palate rises symmetrically and there is no tongue deviation. Hearing is intact to conversational tone. Sensation: Sensation is intact to light and pinprick throughout (facial, trunk, extremities). Vibration is intact at the bilateral big toe but it is decreased distally. There is no extinction with double simultaneous stimulation. There is no sensory dermatomal level identified. Motor: Strength is 5/5 in the bilateral upper and lower extremities.   Shoulder shrug is equal and symmetric.  There is no pronator drift. Deep tendon reflexes: Deep tendon reflexes are 2+/4 at the bilateral biceps, triceps, brachioradialis, patella and achilles. Plantar responses are downgoing bilaterally.  Movement examination: Tone: There is mod increased tone in the RUE and mild to mod in the LUE Abnormal movements: none even with distraction Coordination:  There is decremation with RAM's, with any form of RAMS, including alternating supination and pronation of the forearm, hand opening and closing, finger taps, heel taps  and toe taps, R >L Gait and Station: The patient has no difficulty arising out of a deep-seated chair without the use of the hands. The patient's stride length is good with decreased arm swing on the right.     I have reviewed and interpreted the following labs independently   Chemistry      Component Value Date/Time   NA 139 03/27/2021 0926   K 4.2 03/27/2021 0926   CL 102 03/27/2021 0926   CO2 30 03/27/2021 0926   BUN 14 03/27/2021 0926   CREATININE 1.09 03/27/2021 0926      Component Value Date/Time   CALCIUM 9.5 03/27/2021 0926   ALKPHOS 52 03/27/2021 0926   AST 14 03/27/2021 0926   ALT 18 03/27/2021 0926   BILITOT 0.9 03/27/2021 0926      Lab Results  Component Value Date   TSH 1.75 03/27/2021   Lab Results  Component Value Date   WBC 4.8 03/27/2021   HGB 15.2 03/27/2021   HCT 43.5 03/27/2021   MCV 90.1 03/27/2021  PLT 199.0 03/27/2021     Total time spent on today's visit was 60 minutes, including both face-to-face time and nonface-to-face time.  Time included that spent on review of records (prior notes available to me/labs/imaging if pertinent), discussing treatment and goals, answering patient's questions and coordinating care.  Cc:  [name], MD

## 2021-03-29 ENCOUNTER — Ambulatory Visit (INDEPENDENT_AMBULATORY_CARE_PROVIDER_SITE_OTHER): Payer: Medicare PPO | Admitting: Neurology

## 2021-03-29 ENCOUNTER — Encounter: Payer: Self-pay | Admitting: Neurology

## 2021-03-29 ENCOUNTER — Other Ambulatory Visit: Payer: Self-pay

## 2021-03-29 ENCOUNTER — Telehealth (HOSPITAL_COMMUNITY): Payer: Self-pay

## 2021-03-29 ENCOUNTER — Other Ambulatory Visit: Payer: Self-pay | Admitting: Neurology

## 2021-03-29 VITALS — BP 112/66 | HR 62 | Ht 70.0 in | Wt 210.0 lb

## 2021-03-29 DIAGNOSIS — G20 Parkinson's disease: Secondary | ICD-10-CM | POA: Insufficient documentation

## 2021-03-29 MED ORDER — CARBIDOPA-LEVODOPA 25-100 MG PO TABS: 1.0000 | ORAL_TABLET | Freq: Three times a day (TID) | ORAL | 1 refills | Status: DC

## 2021-03-29 NOTE — Patient Instructions (Addendum)
Start Carbidopa Levodopa as follows:  Take 1/2 tablet three times daily, at least 30 minutes before meals (approximately 7am/11am/4pm), for one week  Then take 1/2 tablet in the morning, 1/2 tablet in the afternoon, 1 tablet in the evening, at least 30 minutes before meals, for one week  Then take 1/2 tablet in the morning, 1 tablet in the afternoon, 1 tablet in the evening, at least 30 minutes before meals, for one week  Then take 1 tablet three times daily at 7am/11am/4pm, at least 30 minutes before meals   As a reminder, carbidopa/levodopa can be taken at the same time as a carbohydrate, but we like to have you take your pill either 30 minutes before a protein source or 1 hour after as protein can interfere with carbidopa/levodopa absorption.  You have been referred to [facility] for therapy. They will call you directly to schedule an appointment.  Please call [phone] if you do not hear from them.    We will send an order for the swallow test.

## 2021-03-29 NOTE — Telephone Encounter (Signed)
5.4 - 1st attempt to contact patient to schedule OP MBS - left voicemail.

## 2021-03-30 ENCOUNTER — Encounter: Payer: Self-pay | Admitting: Family Medicine

## 2021-04-03 LAB — SPECIMEN STATUS REPORT

## 2021-04-03 LAB — LYME DISEASE SEROLOGY W/REFLEX: Lyme Total Antibody EIA: NEGATIVE

## 2021-04-04 ENCOUNTER — Other Ambulatory Visit (HOSPITAL_COMMUNITY): Payer: Self-pay | Admitting: *Deleted

## 2021-04-04 DIAGNOSIS — R1310 Dysphagia, unspecified: Secondary | ICD-10-CM

## 2021-04-07 ENCOUNTER — Ambulatory Visit (HOSPITAL_COMMUNITY)
Admission: RE | Admit: 2021-04-07 | Discharge: 2021-04-07 | Disposition: A | Discharge status: Home / Self Care | Payer: Medicare PPO | Source: Ambulatory Visit | Attending: Neurology | Admitting: Neurology

## 2021-04-07 ENCOUNTER — Other Ambulatory Visit: Payer: Self-pay

## 2021-04-07 DIAGNOSIS — G20 Parkinson's disease: Secondary | ICD-10-CM | POA: Diagnosis present

## 2021-04-07 DIAGNOSIS — R1310 Dysphagia, unspecified: Secondary | ICD-10-CM | POA: Diagnosis present

## 2021-04-10 ENCOUNTER — Ambulatory Visit: Payer: Medicare PPO | Attending: Neurology | Admitting: Physical Therapy

## 2021-04-10 ENCOUNTER — Other Ambulatory Visit: Payer: Self-pay

## 2021-04-10 DIAGNOSIS — R29818 Other symptoms and signs involving the nervous system: Secondary | ICD-10-CM | POA: Diagnosis present

## 2021-04-10 DIAGNOSIS — R2689 Other abnormalities of gait and mobility: Secondary | ICD-10-CM | POA: Insufficient documentation

## 2021-04-10 DIAGNOSIS — M6281 Muscle weakness (generalized): Secondary | ICD-10-CM | POA: Insufficient documentation

## 2021-04-10 DIAGNOSIS — R2681 Unsteadiness on feet: Secondary | ICD-10-CM | POA: Diagnosis present

## 2021-04-10 NOTE — Therapy (Signed)
[facility] [address] [place], [place], [address] Phone: [phone]   Fax:  [phone]  Physical Therapy Evaluation  Patient Details  Name: James Collier MRN: [id number] Date of Birth: Jul 14, 1956 Referring Provider (PT): Tat, Wells Guiles   Encounter Date: 04/10/2021   PT End of Session - 04/10/21 1232    Visit Number 1    Number of Visits 10    Date for PT Re-Evaluation 06/09/21    Authorization Type [employer]    Progress Note Due on Visit 10    PT Start Time 1233    PT Stop Time 1334    PT Time Calculation (min) 61 min    Activity Tolerance Patient tolerated treatment well    Behavior During Therapy [facility] for tasks assessed/performed           Past Medical History:  Diagnosis Date  . Allergy   . Blood in stool   . Cancer [facility]) 2012   prostate    Past Surgical History:  Procedure Laterality Date  . CATARACT EXTRACTION Right   . HEMORROIDECTOMY  1986  . PROSTATE SURGERY  2011  . TONSILLECTOMY  1965    There were no vitals filed for this visit.    Subjective Assessment - 04/10/21 1235    Subjective Here because Dr. Carles Collet sent [place] here for a baseline.  Over the past year, have noted changes in walking, a slower pace and different stride.  Father had Parkinson's so I remember his sx.  Feel like I'm dragging my feet more.  Arms feel heavy and have felt tired. Having difficulty with coordinated hand movements-feel stiff all over.  Started Sinemet on 03/30/2021.  Two more weeks before the full dose.    Patient is accompained by: Family member   wife   Patient Stated Goals Pt's goal for therapy is to get a baseline.    Currently in Pain? Yes   joint pain, knees   Pain Score --   just stiffness, not pain   Pain Location Generalized              [facility] PT Assessment - 04/10/21 1244      Assessment   Medical Diagnosis Parkinson's disease    Referring Provider (PT) Tat, Wells Guiles    Onset Date/Surgical Date  03/29/21    Hand Dominance Left      Precautions   Precautions Fall      Balance Screen   Has the patient fallen in the past 6 months No    Has the patient had a decrease in activity level because of a fear of falling?  No    Is the patient reluctant to leave their home because of a fear of falling?  No      Home Ecologist residence    Living Arrangements Spouse/significant other    Available Help at Discharge Family    Type of [place] to enter   1   Entrance Stairs-Number of Steps [place] One level      Prior Function   Level of Independence Independent    Vocation Retired   Insurance underwriter   Leisure [employer] backyard-pt uses push-mower weekly; walks his dogs, enjoys mechanical things; enjoys driving his large truck and that is more difficult; has a farm in [place] that he enjoys camping on the land.  Enjoys hiking and being outside.  Has an [employer]  bike, and uses several days per week.      Observation/Other Assessments   Focus on Therapeutic Outcomes (FOTO)  NA      Posture/Postural Control   Posture/Postural Control Postural limitations    Postural Limitations Forward head      ROM / Strength   AROM / PROM / Strength Strength;AROM      AROM   Overall AROM  Within functional limits for tasks performed      Strength   Overall Strength Within functional limits for tasks performed    Overall Strength Comments Grossly tested 5/5 throughout      Transfers   Transfers Sit to Stand;Stand to Sit    Sit to Stand 5: Supervision;With upper extremity assist;From chair/3-in-1    Five time sit to stand comments  21.44   pain in knees   Stand to Sit 5: Supervision;Without upper extremity assist;To chair/3-in-1      Ambulation/Gait   Ambulation/Gait Yes    Ambulation/Gait Assistance 5: Supervision;6: Modified independent (Device/Increase time)    Ambulation Distance (Feet) 200 Feet    Assistive device None    Gait Pattern  Decreased arm swing - left;Decreased step length - left;Poor foot clearance - left    Ambulation Surface Level;Indoor    Gait velocity 12.15 sec = 2.7 ft/sec      Standardized Balance Assessment   Standardized Balance Assessment Timed Up and Go Test;Mini-BESTest      Mini-BESTest   Sit To Stand Normal: Comes to stand without use of hands and stabilizes independently.    Rise to Toes Normal: Stable for 3 s with maximum height.    Stand on one leg (left) Normal: 20 s.    Stand on one leg (right) Moderate: < 20 s   18.13, 14.68   Stand on one leg - lowest score 1    Compensatory Stepping Correction - Forward Normal: Recovers independently with a single, large step (second realignement is allowed).    Compensatory Stepping Correction - Backward Normal: Recovers independently with a single, large step    Compensatory Stepping Correction - Left Lateral Moderate: Several steps to recover equilibrium    Compensatory Stepping Correction - Right Lateral Moderate: Several steps to recover equilibrium    Stepping Corredtion Lateral - lowest score 1    Stance - Feet together, eyes open, firm surface  Normal: 30s    Stance - Feet together, eyes closed, foam surface  Moderate: < 30s    Incline - Eyes Closed Normal: Stands independently 30s and aligns with gravity    Change in Gait Speed Normal: Significantly changes walkling speed without imbalance    Walk with head turns - Horizontal Moderate: performs head turns with reduction in gait speed.    Walk with pivot turns Normal: Turns with feet close FAST (< 3 steps) with good balance.    Step over obstacles Moderate: Steps over box but touches box OR displays cautious behavior by slowing gait.    Timed UP & GO with Dual Task Normal: No noticeable change in sitting, standing or walking while backward counting when compared to TUG without    Mini-BEST total score 23      Timed Up and Go Test   Normal TUG (seconds) 14.75    Manual TUG (seconds) 12.62     Cognitive TUG (seconds) 13.07    TUG Comments Scores >13.5-15 sec indicate increased fall risk  Objective measurements completed on examination: See above findings.               PT Education - 04/10/21 1350    Education Details PT eval results, POC; answered pt/wife questions about gentle stretches (verbal/demo hamstring stretch and rolling foot on water bottle or pool noodle); answered questions about spin bike/cycling for PD    Person(s) Educated Patient;Spouse    Methods Explanation    Comprehension Verbalized understanding               PT Long Term Goals - 04/10/21 1359      PT LONG TERM GOAL #1   Title Pt will be independent with HEP for improved strength, balance, transfers, and gait.  TARGET 05/12/2021    Time 5    Period Weeks    Status New      PT LONG TERM GOAL #2   Title Pt will improve 5x sit<>stand to less than or equal to 15 sec to demonstrate improved functional strength and transfer efficiency.    Baseline 21.44 sec    Time 5    Period Weeks    Status New      PT LONG TERM GOAL #3   Title Pt will improve TUG score to less than or equal to 13.5 sec for decreased dall risk.    Baseline 14.75 sec    Time 5    Period Weeks    Status New      PT LONG TERM GOAL #4   Title Pt will improve MiniBESTest score to at least 25/28 for decreased fall risk.    Baseline 23/28    Time 5    Period Weeks    Status New      PT LONG TERM GOAL #5   Title Pt will verbalize understanding of fall prevention in home enviroment.    Time 5    Period Weeks    Status New      Additional Long Term Goals   Additional Long Term Goals Yes      PT LONG TERM GOAL #6   Title Pt will verbalize understanding of local Parkinson's disease-related resources, including ongoing community fitness upon d/c from PT.    Time 5    Status New                  Plan - 04/10/21 1324    Clinical Impression Statement Pt is a 65 year old  male who presents to OPPT with new dx of Parkinson's disease.  He has begun taking Sinemet and is in process of increasing to full dose.  He presents with bradykinesia, abnormal posture, postural instability (lateral directions), decreased timing and coordination of gait, decreased functional strength, decreased balance.  He reports overall feeling of joint stiffness, fatigue, and malaise.  Prior to Parkinson's dx and onset of symptoms in the past year, he was independent and enjoyed hiking and travelling with his camper with his wife.  He would beneift from skilled PT to address the above stated deficits to decrease fall risk and improve overall functional mobility.    Personal Factors and Comorbidities Comorbidity 1    Comorbidities Hx of prostate cancer    Examination-Activity Limitations Locomotion Level;Transfers;Stand    Examination-Participation Restrictions Community Activity;Yard Work;Other   travel/hiking   Stability/Clinical Decision Making Evolving/Moderate complexity    Clinical Decision Making Moderate    Rehab Potential Good    PT Frequency 2x / week    PT Duration Other (  comment)   5 weeks, including eval week   PT Treatment/Interventions ADLs/Self Care Home Management;Gait training;Stair training;Functional mobility training;Therapeutic activities;Therapeutic exercise;Balance training;Neuromuscular re-education;Patient/family education;Manual techniques    PT Next Visit Plan Initiate HEP-posture, stretching, balance; will need to initiate PWR! Moves-seated/standing, use of aerobic machines, sit<>stand, and gait with arm swing.    Recommended Other Services OT evaluation    Consulted and Agree with Plan of Care Patient;Family member/caregiver    Family Member Consulted wife           Patient will benefit from skilled therapeutic intervention in order to improve the following deficits and impairments:  Abnormal gait,Difficulty walking,Decreased balance,Decreased mobility,Decreased  strength,Postural dysfunction  Visit Diagnosis: Other abnormalities of gait and mobility  Unsteadiness on feet  Muscle weakness (generalized)  Other symptoms and signs involving the nervous system     Problem List Patient Active Problem List   Diagnosis Date Noted  . Parkinson's disease ([place]) 03/29/2021  . History of prostate cancer 12/21/2011    James Butt. 04/10/2021, 3:12 PM James Butt., PT  [facility] [address] [place], [place], [address] Phone: [phone]   Fax:  [phone]  Name: James Collier MRN: [id number] Date of Birth: 1956-07-30

## 2021-04-12 ENCOUNTER — Other Ambulatory Visit: Payer: Self-pay

## 2021-04-12 ENCOUNTER — Ambulatory Visit: Payer: Medicare PPO | Admitting: Physical Therapy

## 2021-04-12 ENCOUNTER — Encounter: Payer: Self-pay | Admitting: Physical Therapy

## 2021-04-12 DIAGNOSIS — R2681 Unsteadiness on feet: Secondary | ICD-10-CM

## 2021-04-12 DIAGNOSIS — R2689 Other abnormalities of gait and mobility: Secondary | ICD-10-CM

## 2021-04-12 DIAGNOSIS — M6281 Muscle weakness (generalized): Secondary | ICD-10-CM

## 2021-04-12 NOTE — Therapy (Signed)
[facility] [address] [place], [place], [address] Phone: [phone]   Fax:  [phone]  Physical Therapy Treatment  Patient Details  Name: James Collier MRN: [id number] Date of Birth: 23-Nov-1956 Referring Provider (PT): Tat, Wells Guiles   Encounter Date: 04/12/2021   PT End of Session - 04/12/21 1204    Visit Number 2    Number of Visits 10    Date for PT Re-Evaluation 06/09/21    Authorization Type [employer]    Progress Note Due on Visit 10    PT Start Time 0931    PT Stop Time 1015    PT Time Calculation (min) 44 min    Activity Tolerance Patient tolerated treatment well    Behavior During Therapy [facility] for tasks assessed/performed           Past Medical History:  Diagnosis Date  . Allergy   . Blood in stool   . Cancer [facility]) 2012   prostate    Past Surgical History:  Procedure Laterality Date  . CATARACT EXTRACTION Right   . HEMORROIDECTOMY  1986  . PROSTATE SURGERY  2011  . TONSILLECTOMY  1965    There were no vitals filed for this visit.   Subjective Assessment - 04/12/21 0933    Subjective The sit to stands the other day without using my arms really hurt my knees.    Patient is accompained by: Family member   wife   Patient Stated Goals Pt's goal for therapy is to get a baseline.                             [facility] Adult PT Treatment/Exercise - 04/12/21 0001      Exercises   Exercises Knee/Hip      Knee/Hip Exercises: Stretches   Passive Hamstring Stretch Both;2 reps;30 seconds    Passive Hamstring Stretch Limitations with heel on ground, reviewed proper technique for hamstring stretch B, given at eval                  PT Education - 04/12/21 1204    Education Details initial seated PWR moves for HEP, importance of exercise with PD.    Person(s) Educated Patient    Methods Explanation;Demonstration;Verbal cues;Handout    Comprehension Verbalized  understanding;Returned demonstration             04/12/21 0001  Exercises  Exercises Knee/Hip  Knee/Hip Exercises: Stretches  Passive Hamstring Stretch Both;2 reps;30 seconds  Passive Hamstring Stretch Limitations with heel on ground, reviewed proper technique for hamstring stretch B, given at eval    Pt performs PWR! Moves in sitting  position   PWR! Up for improved posture 2 x 10 reps -verbal, demo and tactile for proper technique, pt improving with incr reps, cues to perform first couple of reps slowly for more sustained stretch and postural awareness.   PWR! Rock for improved weighshifting 2 x 5 reps B - beginning with shifting and leaning down to thigh and reaching arm overhead with cues to look up and hand and then progressing to kicking leg straight out   PWR! Twist for improved trunk rotation 2 x 5 reps B - cues to reset in midline with tall posture each time before twisting and to use hips/feet to help pivot towards other side, pt improving with 2nd rep   PWR! Step for improved step initiation 2 x 5 reps B - 1st set  stepping single leg out and in, then step out and out and step in and in, pt with initial difficulty coordinating but improved with incr practice.   Cues provided for proper technique, intensity of movement/bigger movement patterns, and purpose/rationale of each exercise. Pt reporting intensity level at a 6-7/10.   Provided handout for instructions and also showed where pt can access on youtube in case he needs a visual reminder on how to perform.         PT Long Term Goals - 04/10/21 1359      PT LONG TERM GOAL #1   Title Pt will be independent with HEP for improved strength, balance, transfers, and gait.  TARGET 05/12/2021    Time 5    Period Weeks    Status New      PT LONG TERM GOAL #2   Title Pt will improve 5x sit<>stand to less than or equal to 15 sec to demonstrate improved functional strength and transfer efficiency.    Baseline 21.44 sec     Time 5    Period Weeks    Status New      PT LONG TERM GOAL #3   Title Pt will improve TUG score to less than or equal to 13.5 sec for decreased dall risk.    Baseline 14.75 sec    Time 5    Period Weeks    Status New      PT LONG TERM GOAL #4   Title Pt will improve MiniBESTest score to at least 25/28 for decreased fall risk.    Baseline 23/28    Time 5    Period Weeks    Status New      PT LONG TERM GOAL #5   Title Pt will verbalize understanding of fall prevention in home enviroment.    Time 5    Period Weeks    Status New      Additional Long Term Goals   Additional Long Term Goals Yes      PT LONG TERM GOAL #6   Title Pt will verbalize understanding of local Parkinson's disease-related resources, including ongoing community fitness upon d/c from PT.    Time 5    Status New                 Plan - 04/12/21 1213    Clinical Impression Statement Began to initiate HEP today for seated PWR moves. Pt tolerated session well, had initial incr difficulty with seated PWR twist for proper technique and pivoting with feet/hips for incr twist, but pt did improve with incr reps. At end of session pt reporting feeling less stiff. Will continue to progress towards LTGs.    Personal Factors and Comorbidities Comorbidity 1    Comorbidities Hx of prostate cancer    Examination-Activity Limitations Locomotion Level;Transfers;Stand    Examination-Participation Restrictions Community Activity;Yard Work;Other   travel/hiking   Stability/Clinical Decision Making Evolving/Moderate complexity    Rehab Potential Good    PT Frequency 2x / week    PT Duration Other (comment)   5 weeks, including eval week   PT Treatment/Interventions ADLs/Self Care Home Management;Gait training;Stair training;Functional mobility training;Therapeutic activities;Therapeutic exercise;Balance training;Neuromuscular re-education;Patient/family education;Manual techniques    PT Next Visit Plan review seated PWR  moves from HEP and start standing PWR, use of aerobic machines, sit<>stand, and gait with arm swing. information on community resources.    Consulted and Agree with Plan of Care Patient;Family member/caregiver    Family Member Consulted wife  Patient will benefit from skilled therapeutic intervention in order to improve the following deficits and impairments:  Abnormal gait,Difficulty walking,Decreased balance,Decreased mobility,Decreased strength,Postural dysfunction  Visit Diagnosis: Other abnormalities of gait and mobility  Unsteadiness on feet  Muscle weakness (generalized)     Problem List Patient Active Problem List   Diagnosis Date Noted  . Parkinson's disease ([place]) 03/29/2021  . History of prostate cancer 12/21/2011    Arliss Journey, PT, DPT  04/12/2021, 12:15 PM  [facility] [address] [place], [place], [address] Phone: [phone]   Fax:  [phone]  Name: ADISON JERGER MRN: [id number] Date of Birth: December 31, 1955

## 2021-04-17 ENCOUNTER — Encounter: Payer: Self-pay | Admitting: Physical Therapy

## 2021-04-17 ENCOUNTER — Telehealth: Payer: Self-pay | Admitting: Physical Therapy

## 2021-04-17 ENCOUNTER — Ambulatory Visit: Payer: Medicare PPO | Admitting: Physical Therapy

## 2021-04-17 ENCOUNTER — Other Ambulatory Visit: Payer: Self-pay

## 2021-04-17 DIAGNOSIS — R2689 Other abnormalities of gait and mobility: Secondary | ICD-10-CM

## 2021-04-17 DIAGNOSIS — R2681 Unsteadiness on feet: Secondary | ICD-10-CM

## 2021-04-17 DIAGNOSIS — G20 Parkinson's disease: Secondary | ICD-10-CM

## 2021-04-17 DIAGNOSIS — R29818 Other symptoms and signs involving the nervous system: Secondary | ICD-10-CM

## 2021-04-17 NOTE — Telephone Encounter (Signed)
James Collier was evaluated by PT last week and based on his reports of decreased coordination with ADLS with his left UE, as well as stiffness in L shoulder, he would benefit from Occupational therapy evaluation.  If you agree, could you please write OT eval and treat order in his chart?  Thank you.  Mady Haagensen, PT 04/17/21 1:48 PM Phone: [phone] Fax: [phone]

## 2021-04-17 NOTE — Therapy (Signed)
[facility] [address] [place], [place], [address] Phone: [phone]   Fax:  [phone]  Physical Therapy Treatment  Patient Details  Name: James Collier MRN: [id number] Date of Birth: [birth year] Referring Provider (PT): Tat, Wells Guiles   Encounter Date: 04/17/2021   PT End of Session - 04/17/21 1230    Visit Number 3    Number of Visits 10    Date for PT Re-Evaluation 06/09/21    Authorization Type [employer]    Progress Note Due on Visit 10    PT Start Time 1233    PT Stop Time 1316    PT Time Calculation (min) 43 min    Activity Tolerance Patient tolerated treatment well    Behavior During Therapy [facility] for tasks assessed/performed           Past Medical History:  Diagnosis Date  . Allergy   . Blood in stool   . Cancer [facility]) 2012   prostate    Past Surgical History:  Procedure Laterality Date  . CATARACT EXTRACTION Right   . HEMORROIDECTOMY  1986  . PROSTATE SURGERY  2011  . TONSILLECTOMY  1965    There were no vitals filed for this visit.   Subjective Assessment - 04/17/21 1230    Subjective Uneventful weekend.  Knees are better, but that was really hard getting up without hands.    Patient is accompained by: --    Patient Stated Goals Pt's goal for therapy is to get a baseline.    Currently in Pain? Yes   normal baseline pain   Pain Score 2     Pain Location Knee   joint pain   Pain Orientation Left    Pain Descriptors / Indicators Aching    Pain Onset More than a month ago    Pain Frequency Constant    Aggravating Factors  sit to stand no UE support    Pain Relieving Factors unsure; its a baseline pain                Reviewed PWR! Moves in sitting  position, as part of HEP given last visit   PWR! Up for improved posture 10 reps -verbal and visual cues for proper technique, pt improving with incr reps, cues to perform first couple of reps slowly for more sustained stretch and  postural awareness. Cues for posture and L hand opening   PWR! Rock for improved weighshifting 2 x 5 reps Bilat -Pt tends to reach to same side he is leaning, with minimal bringing out to the side.  Attempted cueing for correct way and pt continues same side reach as he feels he gets a better stretch.    PWR! Twist for improved trunk rotation 2 x 5 reps Bilat - cues to stop and reset in midline with tall posture each time before twisting and to use hips/feet to help pivot towards other side    PWR! Step for improved step initiation 10 reps Bilat-  step out and out and step in and in, pt with initial difficulty coordinating but improved with incr practice.     Pt performs PWR! Moves in standing position    PWR! Up for improved posture x 10 reps-verbal and visual cues provided for proper technique.  Cues to hinge at hips with minimal squat to avoid knee pain.  PWR! Rock for improved weighshifting x 10 reps-verbal and visual cues provided for proper technique.  Cues for hand position and to  look at open hand each side.  PWR! Twist for improved trunk rotation x 10 reps-verbal/visual cues for technique.     PWR! Step for improved step initiation x 10 reps-cues for step length and height and for smooth control return to midline.  Pt improves with repetition and will need additional practice prior to HEP for standing PWR! Moves                   [facility] Adult PT Treatment/Exercise - 04/17/21 0001      Ambulation/Gait   Ambulation/Gait Yes    Ambulation/Gait Assistance 5: Supervision;6: Modified independent (Device/Increase time)    Ambulation/Gait Assistance Details Used bilateral walking poles, to facilitate increased reciprocal arm swing.  Once walking poles removed, worked on quick start/stops and forward/back walking. Upon starting again, pt goes to ipsalateral arm swing, needing tactile/verbal cues to relax arms for reciprocal arm swing.    Ambulation Distance (Feet) 400 Feet    600              Balance Exercises - 04/17/21 0001      Balance Exercises: Standing   Stepping Strategy Anterior;Posterior;10 reps;Limitations    Stepping Strategy Limitations Cues for technique    Other Standing Exercises Stagger stance forward/back rocking with added 1 UE reaching, 10-15 reps each foot position    Other Standing Exercises Comments Trunk rotation, in corner, reaching across to touch wall, 10 reps on each side, wide BOS for increased weightshifting             PT Education - 04/17/21 1320    Education Details Reviewed importance of/functional activities associated with each PWR! Move    Person(s) Educated Patient    Methods Explanation;Demonstration    Comprehension Verbalized understanding;Verbal cues required               PT Long Term Goals - 04/10/21 1359      PT LONG TERM GOAL #1   Title Pt will be independent with HEP for improved strength, balance, transfers, and gait.  TARGET 05/12/2021    Time 5    Period Weeks    Status New      PT LONG TERM GOAL #2   Title Pt will improve 5x sit<>stand to less than or equal to 15 sec to demonstrate improved functional strength and transfer efficiency.    Baseline 21.44 sec    Time 5    Period Weeks    Status New      PT LONG TERM GOAL #3   Title Pt will improve TUG score to less than or equal to 13.5 sec for decreased dall risk.    Baseline 14.75 sec    Time 5    Period Weeks    Status New      PT LONG TERM GOAL #4   Title Pt will improve MiniBESTest score to at least 25/28 for decreased fall risk.    Baseline 23/28    Time 5    Period Weeks    Status New      PT LONG TERM GOAL #5   Title Pt will verbalize understanding of fall prevention in home enviroment.    Time 5    Period Weeks    Status New      Additional Long Term Goals   Additional Long Term Goals Yes      PT LONG TERM GOAL #6   Title Pt will verbalize understanding of local Parkinson's disease-related resources, including  ongoing community  fitness upon d/c from PT.    Time 5    Status New                 Plan - 04/17/21 1321    Clinical Impression Statement Reviewed HEP for seated PWR! Moves and pt needs cues for correct technique and intensity.  Began standing PWR! Moves today, but again, pt needs cues for correct technique and larger amplitude throughout.  Continued work on stepping and weigthshfiting as well as gait incorporating coordinated arm movements.  With repetition and practice throughout, pt improved with technique.  He will need a little more practice and reinforcement with standing PWR! Moves prior to giving as part of HEP.    Personal Factors and Comorbidities Comorbidity 1    Comorbidities Hx of prostate cancer    Examination-Activity Limitations Locomotion Level;Transfers;Stand    Examination-Participation Restrictions Community Activity;Yard Work;Other   travel/hiking   Stability/Clinical Decision Making Evolving/Moderate complexity    Rehab Potential Good    PT Frequency 2x / week    PT Duration Other (comment)   5 weeks, including eval week   PT Treatment/Interventions ADLs/Self Care Home Management;Gait training;Stair training;Functional mobility training;Therapeutic activities;Therapeutic exercise;Balance training;Neuromuscular re-education;Patient/family education;Manual techniques    PT Next Visit Plan Work on standing PWR! Moves and add as HEP when appropriate, use of aerobic machines, sit<>stand, and gait with arm swing; gait on compliant surfaces. information on community resources.    Recommended Other Services Discussed again OT today and pt in agreement for PT to request OT eval due to decreased coordination, decreased strength LUE    Consulted and Agree with Plan of Care Patient           Patient will benefit from skilled therapeutic intervention in order to improve the following deficits and impairments:  Abnormal gait,Difficulty walking,Decreased balance,Decreased  mobility,Decreased strength,Postural dysfunction  Visit Diagnosis: Other abnormalities of gait and mobility  Unsteadiness on feet  Other symptoms and signs involving the nervous system     Problem List Patient Active Problem List   Diagnosis Date Noted  . Parkinson's disease ([place]) 03/29/2021  . History of prostate cancer 12/21/2011    Frazier Butt. 04/17/2021, 1:26 PM  Frazier Butt., PT   [facility] [address] [place], [place], [address] Phone: [phone]   Fax:  [phone]  Name: James Collier MRN: [id number] Date of Birth: 1956-05-04

## 2021-04-19 ENCOUNTER — Encounter: Payer: Self-pay | Admitting: Physical Therapy

## 2021-04-19 ENCOUNTER — Other Ambulatory Visit: Payer: Self-pay

## 2021-04-19 ENCOUNTER — Ambulatory Visit: Payer: Medicare PPO | Admitting: Physical Therapy

## 2021-04-19 DIAGNOSIS — R2689 Other abnormalities of gait and mobility: Secondary | ICD-10-CM

## 2021-04-19 DIAGNOSIS — R2681 Unsteadiness on feet: Secondary | ICD-10-CM

## 2021-04-19 DIAGNOSIS — R29818 Other symptoms and signs involving the nervous system: Secondary | ICD-10-CM

## 2021-04-19 DIAGNOSIS — M6281 Muscle weakness (generalized): Secondary | ICD-10-CM

## 2021-04-19 NOTE — Therapy (Addendum)
[facility] [address] [place], [place], [address] Phone: [phone]   Fax:  [phone]  Physical Therapy Treatment  Patient Details  Name: James Collier MRN: [id number] Date of Birth: September 13, 1956 Referring Provider (PT): Tat, Wells Guiles   Encounter Date: 04/19/2021   PT End of Session - 04/19/21 1016    Visit Number 4    Number of Visits 10    Date for PT Re-Evaluation 06/09/21    Authorization Type [employer]    Progress Note Due on Visit 10    PT Start Time 0931    PT Stop Time 1014    PT Time Calculation (min) 43 min    Activity Tolerance Patient tolerated treatment well    Behavior During Therapy [facility] for tasks assessed/performed           Past Medical History:  Diagnosis Date  . Allergy   . Blood in stool   . Cancer [facility]) 2012   prostate    Past Surgical History:  Procedure Laterality Date  . CATARACT EXTRACTION Right   . HEMORROIDECTOMY  1986  . PROSTATE SURGERY  2011  . TONSILLECTOMY  1965    There were no vitals filed for this visit.   Subjective Assessment - 04/19/21 0944    Subjective Rode his exercise bike this morning. Did his exercises at home. Walked about a mile and a half yesterday. Wanting to be weighed                             [facility] Adult PT Treatment/Exercise - 04/19/21 0944      Ambulation/Gait   Ambulation/Gait Yes    Ambulation/Gait Assistance 5: Supervision    Ambulation/Gait Assistance Details used B walking poles to help facilitate B arm swing with pt working on taking longer strides and having incr foot clearance, performed x400', removed walking poles with pt able to maintain incr stride length and have arms relaxed for more reciprocal arm swing. Performed cognitive challenge during gait with pt naming different cities in the [place], cues to maintain gait speed, noted slower movements with LLE during gait with cognitive task    Ambulation Distance (Feet)  700 Feet    Assistive device None    Gait Pattern Decreased arm swing - left;Decreased step length - left;Poor foot clearance - left    Ambulation Surface Indoor;Level    Gait Comments plus an additional x2 laps at end of session with focus on step length with cog challenge pt naming different airplane companies, noticed slowed speed      Neuro Re-ed    Neuro Re-ed Details  alternating forward stepping over 4" obstacle for SLS/foot clearance x6 reps B, then alternating forwrad step with PWR up posture with arms extended x6 reps B - cues for intensity of movement              Pt performs PWR! Moves in standing position    PWR! Up for improved posture 2  x 10 reps-verbal and visual cues provided for proper technique.    PWR! Rock for improved weighshifting 2 x 10 reps-verbal and visual cues provided for proper technique.  Cues for hand position and to look at open hand each side (improved with incr reps)  PWR! Twist for improved trunk rotation 2 x 10 reps-verbal/visual cues for technique.     PWR! Step for improved step initiation x 10 reps B-cues for step length and  height and for smooth control return to midline - pt with difficulty sequencing at times and trouble looking out at hand. Plus additional x5 reps B with use of 2" black foam beam to step over - pt improving with technique when having an obstacle to step over.   Provided handout for Up, Rock, Twist - will need one more session of practice with Step before adding to HEP.      PT Education - 04/19/21 1220    Education Details standing PWR moves (with exception of PWR Step) to HEP, intensity of work when performing pt's bike at home, importance of focusing on bigger steps when ambulating at home for larger movement patterns.    Person(s) Educated Patient    Methods Explanation;Demonstration;Handout    Comprehension Verbalized understanding;Returned demonstration               PT Long Term Goals - 04/10/21 1359       PT LONG TERM GOAL #1   Title Pt will be independent with HEP for improved strength, balance, transfers, and gait.  TARGET 05/12/2021    Time 5    Period Weeks    Status New      PT LONG TERM GOAL #2   Title Pt will improve 5x sit<>stand to less than or equal to 15 sec to demonstrate improved functional strength and transfer efficiency.    Baseline 21.44 sec    Time 5    Period Weeks    Status New      PT LONG TERM GOAL #3   Title Pt will improve TUG score to less than or equal to 13.5 sec for decreased dall risk.    Baseline 14.75 sec    Time 5    Period Weeks    Status New      PT LONG TERM GOAL #4   Title Pt will improve MiniBESTest score to at least 25/28 for decreased fall risk.    Baseline 23/28    Time 5    Period Weeks    Status New      PT LONG TERM GOAL #5   Title Pt will verbalize understanding of fall prevention in home enviroment.    Time 5    Period Weeks    Status New      Additional Long Term Goals   Additional Long Term Goals Yes      PT LONG TERM GOAL #6   Title Pt will verbalize understanding of local Parkinson's disease-related resources, including ongoing community fitness upon d/c from PT.    Time 5    Status New                 Plan - 04/19/21 2126    Clinical Impression Statement Reviewed standing PWR moves today - pt did well with them today with improved technique.Did have trouble sequencing PWR Step at this time, only added Up, Rock, and Twist and will review step one more time before adding to HEP. Pt did well today with improved B arm swing and incr step length after use of walking poles. With cognitive challenge, pt does slow gait down.    Personal Factors and Comorbidities Comorbidity 1    Comorbidities Hx of prostate cancer    Examination-Activity Limitations Locomotion Level;Transfers;Stand    Examination-Participation Restrictions Community Activity;Yard Work;Other   travel/hiking   Stability/Clinical Decision Making  Evolving/Moderate complexity    Rehab Potential Good    PT Frequency 2x / week  PT Duration Other (comment)   5 weeks, including eval week   PT Treatment/Interventions ADLs/Self Care Home Management;Gait training;Stair training;Functional mobility training;Therapeutic activities;Therapeutic exercise;Balance training;Neuromuscular re-education;Patient/family education;Manual techniques    PT Next Visit Plan review standing PWR moves and add PWR step, use of aerobic machines, sit<>stand, and gait with arm swing; gait on compliant surfaces. dual tasking with gait, information on community resources.    Consulted and Agree with Plan of Care Patient           Patient will benefit from skilled therapeutic intervention in order to improve the following deficits and impairments:  Abnormal gait,Difficulty walking,Decreased balance,Decreased mobility,Decreased strength,Postural dysfunction  Visit Diagnosis: Other abnormalities of gait and mobility  Unsteadiness on feet  Other symptoms and signs involving the nervous system  Muscle weakness (generalized)     Problem List Patient Active Problem List   Diagnosis Date Noted  . Parkinson's disease ([place]) 03/29/2021  . History of prostate cancer 12/21/2011    Arliss Journey, PT, DPT  04/19/2021, 9:27 PM  [facility] [address] [place], [place], [address] Phone: [phone]   Fax:  [phone]  Name: James Collier MRN: [id number] Date of Birth: [birth year]

## 2021-04-26 ENCOUNTER — Ambulatory Visit: Payer: Medicare PPO | Attending: Neurology | Admitting: Physical Therapy

## 2021-04-26 ENCOUNTER — Other Ambulatory Visit: Payer: Self-pay

## 2021-04-26 ENCOUNTER — Encounter: Payer: Self-pay | Admitting: Physical Therapy

## 2021-04-26 DIAGNOSIS — M25512 Pain in left shoulder: Secondary | ICD-10-CM | POA: Diagnosis present

## 2021-04-26 DIAGNOSIS — R2689 Other abnormalities of gait and mobility: Secondary | ICD-10-CM | POA: Diagnosis not present

## 2021-04-26 DIAGNOSIS — R2681 Unsteadiness on feet: Secondary | ICD-10-CM | POA: Diagnosis present

## 2021-04-26 DIAGNOSIS — R29818 Other symptoms and signs involving the nervous system: Secondary | ICD-10-CM

## 2021-04-26 DIAGNOSIS — M6281 Muscle weakness (generalized): Secondary | ICD-10-CM | POA: Insufficient documentation

## 2021-04-26 DIAGNOSIS — R278 Other lack of coordination: Secondary | ICD-10-CM | POA: Insufficient documentation

## 2021-04-26 DIAGNOSIS — G20 Parkinson's disease: Secondary | ICD-10-CM | POA: Insufficient documentation

## 2021-04-26 NOTE — Therapy (Addendum)
[facility] [address] [place], [place], [address] Phone: [phone]   Fax:  [phone]  Physical Therapy Treatment  Patient Details  Name: SIE FORMISANO MRN: [id number] Date of Birth: 08-Jan-1956 Referring Provider (PT): Tat, Wells Guiles   Encounter Date: 04/26/2021   PT End of Session - 04/26/21 1017    Visit Number 5    Number of Visits 10    Date for PT Re-Evaluation 06/09/21    Authorization Type [employer]    Progress Note Due on Visit 10    PT Start Time 0933    PT Stop Time 1015    PT Time Calculation (min) 42 min    Activity Tolerance Patient tolerated treatment well    Behavior During Therapy [facility] for tasks assessed/performed           Past Medical History:  Diagnosis Date  . Allergy   . Blood in stool   . Cancer [facility]) 2012   prostate    Past Surgical History:  Procedure Laterality Date  . CATARACT EXTRACTION Right   . HEMORROIDECTOMY  1986  . PROSTATE SURGERY  2011  . TONSILLECTOMY  1965    There were no vitals filed for this visit.   Subjective Assessment - 04/26/21 0935    Subjective No changes since he was last here.    Patient Stated Goals Pt's goal for therapy is to get a baseline.    Currently in Pain? No/denies                             [facility] Adult PT Treatment/Exercise - 04/26/21 0956      Ambulation/Gait   Ambulation/Gait Yes    Ambulation/Gait Assistance 5: Supervision    Ambulation/Gait Assistance Details indoor over level surfaces - initial cues for relax BUE for arm swing, then practiced gait outdoors with work for incr arm swing and cues to relax hands (pt has tendency to hold them in fists), then performed gait with head motions and pt with no LOB    Ambulation Distance (Feet) 500 Feet    Assistive device None    Gait Pattern Decreased arm swing - left;Decreased step length - left;Poor foot clearance - left    Ambulation Surface  Level;Unlevel;Indoor;Grass      Knee/Hip Exercises: Aerobic   Stepper Seated SciFit Stepper: level 3.0>3.5 for 6 minutes for aerobic activity, ROM. While pt on stepper, discussed aerobic activity in regards to PD and trying to work up to 7/10 RPE at home (pt reports currently at a 4-6/10 at home). Pt reporting feeling good after using the SciFit and liking this machine, discussed that they would have something to use like this at the [facility], pt may look into this when weather gets colder to go to the gym               Balance Exercises - 04/26/21 0001      Balance Exercises: Standing   Other Standing Exercises Stagger stance forward/back rocking with BUE reaching, 10-15 reps each foot position - cues for proper technique and cues to open hands nice and big when performing    Other Standing Exercises Comments marching and then with contralateral UE lift down and back x50' , marching with holding boomwhacker and performing trunk rotation over marching leg down and back x50' - cues for proper sequencing and technique and incr ROM  Pt performs PWR! Moves instandingposition   PWR! Up for improved posture  x 10 reps- initial cues to open hands nice and big  PWR! Rock for improved weighshiftingx 10 reps- initial cues to look at hands  PWR! Twist for improved trunk rotationx 10 reps-verbal/visual cues for technique, cues for using legs to help pivot and stopping to reset in the middle   PWR! Step for improved step initiationx 10 reps B-cues for step length and height and for smooth control return to midline.  Plus additional x5 reps B with use of 4" black foam beam to step over for incr foot clearance. Added PWR Step to pt's HEP    PT Education - 04/26/21 1016    Education Details provided handout for local community resources, added PWR Step to [employer]) Educated Patient    Methods Explanation;Demonstration;Handout    Comprehension Verbalized  understanding;Returned demonstration               PT Long Term Goals - 04/10/21 1359      PT LONG TERM GOAL #1   Title Pt will be independent with HEP for improved strength, balance, transfers, and gait.  TARGET 05/12/2021    Time 5    Period Weeks    Status New      PT LONG TERM GOAL #2   Title Pt will improve 5x sit<>stand to less than or equal to 15 sec to demonstrate improved functional strength and transfer efficiency.    Baseline 21.44 sec    Time 5    Period Weeks    Status New      PT LONG TERM GOAL #3   Title Pt will improve TUG score to less than or equal to 13.5 sec for decreased dall risk.    Baseline 14.75 sec    Time 5    Period Weeks    Status New      PT LONG TERM GOAL #4   Title Pt will improve MiniBESTest score to at least 25/28 for decreased fall risk.    Baseline 23/28    Time 5    Period Weeks    Status New      PT LONG TERM GOAL #5   Title Pt will verbalize understanding of fall prevention in home enviroment.    Time 5    Period Weeks    Status New      Additional Long Term Goals   Additional Long Term Goals Yes      PT LONG TERM GOAL #6   Title Pt will verbalize understanding of local Parkinson's disease-related resources, including ongoing community fitness upon d/c from PT.    Time 5    Status New                 Plan - 04/26/21 1224    Clinical Impression Statement Reviewed standing PWR moves given from last visit and added PWR Step to HEP (pt better able to coordinate today compared to last visit). Continued to focus on gait training with focus on incr step length and arm swing, pt did well even on outdoor surfaces. Pt reports that his wife says he is walking better. Performed staggered stance A/P weight shifting with BUE arm movements and marching with alternating UE lifts - pt with initially having difficulty coordinating, but did improve with incr reps.    Personal Factors and Comorbidities Comorbidity 1    Comorbidities Hx  of prostate cancer    Examination-Activity  Limitations Locomotion Level;Transfers;Stand    Examination-Participation Restrictions Community Activity;Yard Work;Other   travel/hiking   Stability/Clinical Decision Making Evolving/Moderate complexity    Rehab Potential Good    PT Frequency 2x / week    PT Duration Other (comment)   5 weeks, including eval week   PT Treatment/Interventions ADLs/Self Care Home Management;Gait training;Stair training;Functional mobility training;Therapeutic activities;Therapeutic exercise;Balance training;Neuromuscular re-education;Patient/family education;Manual techniques    PT Next Visit Plan pt needs to have 2 more appts added for 10 visits in POC. review standing/seated PWR as appropriate and maybe try a PWR flow. use of aerobic machines, sit<>stands; gait on compliant surfaces. dual tasking with gait.    Consulted and Agree with Plan of Care Patient           Patient will benefit from skilled therapeutic intervention in order to improve the following deficits and impairments:  Abnormal gait,Difficulty walking,Decreased balance,Decreased mobility,Decreased strength,Postural dysfunction  Visit Diagnosis: Other abnormalities of gait and mobility  Unsteadiness on feet  Other symptoms and signs involving the nervous system     Problem List Patient Active Problem List   Diagnosis Date Noted  . Parkinson's disease ([place]) 03/29/2021  . History of prostate cancer 12/21/2011    Arliss Journey, PT, DPT  04/26/2021, 12:27 PM  [facility] [address] [place], [place], [address] Phone: [phone]   Fax:  [phone]  Name: RALPHAEL SOUTHGATE MRN: [id number] Date of Birth: 1956-07-06

## 2021-04-27 ENCOUNTER — Encounter: Payer: Self-pay | Admitting: Physical Therapy

## 2021-04-27 ENCOUNTER — Ambulatory Visit: Payer: Medicare PPO | Admitting: Physical Therapy

## 2021-04-27 DIAGNOSIS — R29818 Other symptoms and signs involving the nervous system: Secondary | ICD-10-CM

## 2021-04-27 DIAGNOSIS — R2681 Unsteadiness on feet: Secondary | ICD-10-CM

## 2021-04-27 DIAGNOSIS — R2689 Other abnormalities of gait and mobility: Secondary | ICD-10-CM

## 2021-04-27 NOTE — Therapy (Signed)
[facility] [address] [place], [place], [address] Phone: [phone]   Fax:  [phone]  Physical Therapy Treatment  Patient Details  Name: James Collier MRN: [id number] Date of Birth: October 25, 1956 Referring Provider (PT): Tat, Wells Guiles   Encounter Date: 04/27/2021   PT End of Session - 04/27/21 1013    Visit Number 6    Number of Visits 10    Date for PT Re-Evaluation 06/09/21    Authorization Type [employer]    Authorization Time Period 04/10/21-06/09/21    Authorization - Visit Number 5    Authorization - Number of Visits 10    Progress Note Due on Visit 10    PT Start Time 1016    PT Stop Time 1059    PT Time Calculation (min) 43 min    Activity Tolerance Patient tolerated treatment well    Behavior During Therapy [facility] for tasks assessed/performed           Past Medical History:  Diagnosis Date  . Allergy   . Blood in stool   . Cancer [facility]) 2012   prostate    Past Surgical History:  Procedure Laterality Date  . CATARACT EXTRACTION Right   . HEMORROIDECTOMY  1986  . PROSTATE SURGERY  2011  . TONSILLECTOMY  1965    There were no vitals filed for this visit.   Subjective Assessment - 04/27/21 1013    Subjective No changes since last visit.    Patient Stated Goals Pt's goal for therapy is to get a baseline.    Currently in Pain? No/denies                             [facility] Adult PT Treatment/Exercise - 04/27/21 0001      Ambulation/Gait   Ambulation/Gait Yes    Ambulation/Gait Assistance 5: Supervision    Ambulation/Gait Assistance Details indoor surfaces with conversation tasks, focus on maintaining swing, step length, then indoor surfaces with instruction in and initial [place] of bilateral walking poles, then outdoor gait on sidewalks with pt using bilat walking poles.    Ambulation Distance (Feet) 500 Feet   350; 600 ft x 2 outdoors   Assistive device None;Other (Comment)   B  walking poles   Gait Pattern Decreased arm swing - left;Decreased step length - left;Poor foot clearance - left    Ambulation Surface Level;Indoor;Unlevel;Outdoor    Gait Comments Gait with bilateral walking poles, instruction on technique to use opposite arm/leg, pushing back through poles, cues for increased/deliberate step length, coordination of arms/legs.  Pt able to get sequence, then gets out of sequence after 25-50 ft.      High Level Balance   High Level Balance Comments Obstacle negotiation, stepping over (to avoid) obstacles, then steppin on obstacles, focus on single limb stance, stability with obstacle negotiation; then sidestep diagonals in forward/back direction, cues for maintaining large step length.  Standing PWR! Rock, with cues for leg lift/stomp for improved SLS balance.  On [employer]:  PWR! Up x 10 reps, PWR! Rock x 10 reps, then [employer]! step x 10 reps, with cues for increased intensity of movement, for improved balance and coordination.      Exercises   Exercises Knee/Hip      Knee/Hip Exercises: Aerobic   Stepper Seated SciFit Stepper: level 3.5 for 8 minutes for aerobic activity, flexibility, keeping steps/min, 105-110 to (increase effort level). While pt on stepper, discussed aerobic activity  in regards to PD and trying to work up to 7/10 RPE at home (pt reports currently at a 4-6/10 at home)                       PT [place] - 04/10/21 1359      PT LONG TERM GOAL #1   Title Pt will be independent with HEP for improved strength, balance, transfers, and gait.  TARGET 05/12/2021    Time 5    Period Weeks    Status New      PT LONG TERM GOAL #2   Title Pt will improve 5x sit<>stand to less than or equal to 15 sec to demonstrate improved functional strength and transfer efficiency.    Baseline 21.44 sec    Time 5    Period Weeks    Status New      PT LONG TERM GOAL #3   Title Pt will improve TUG score to less than or equal to 13.5 sec for decreased  dall risk.    Baseline 14.75 sec    Time 5    Period Weeks    Status New      PT LONG TERM GOAL #4   Title Pt will improve MiniBESTest score to at least 25/28 for decreased fall risk.    Baseline 23/28    Time 5    Period Weeks    Status New      PT LONG TERM GOAL #5   Title Pt will verbalize understanding of fall prevention in home enviroment.    Time 5    Period Weeks    Status New      Additional Long Term Goals   Additional Long Term Goals Yes      PT LONG TERM GOAL #6   Title Pt will verbalize understanding of local Parkinson's disease-related resources, including ongoing community fitness upon d/c from PT.    Time 5    Status New                 Plan - 04/27/21 1125    Clinical Impression Statement Continued to reinforce importance of aerobic activity as part of overall exercise program.  Initiated use of walking poles on indoor and outdoor surfaces to improve consistent step length, arm swing, posture, but pt has initial difficulty today with coordination with use of bilateral walking poles.  He continues to report overall improved movement, including increased awareness of better movement patterns.  He will continue to benefit from skilled PT to improve balance, coordination with gait for improved overall functional mobility.    Personal Factors and Comorbidities Comorbidity 1    Comorbidities Hx of prostate cancer    Examination-Activity Limitations Locomotion Level;Transfers;Stand    Examination-Participation Restrictions Community Activity;Yard Work;Other   travel/hiking   Stability/Clinical Decision Making Evolving/Moderate complexity    Rehab Potential Good    PT Frequency 2x / week    PT Duration Other (comment)   5 weeks, including eval week   PT Treatment/Interventions ADLs/Self Care Home Management;Gait training;Stair training;Functional mobility training;Therapeutic activities;Therapeutic exercise;Balance training;Neuromuscular  re-education;Patient/family education;Manual techniques    PT Next Visit Plan pt needs to have 2 more appts added for 10 visits in POC (I put this in appt notes, but I don't see that he got scheduled-he was agreeable, I think he just forgot to stop up front).   review standing/seated PWR as appropriate and maybe try a PWR flow. use of aerobic machines,  sit<>stands; gait on compliant surfaces. dual tasking with gait.  Consider giving chart/explanation of optimal exercise post-DC so pt has a way to organize and track exercise program.    Consulted and Agree with Plan of Care Patient           Patient will benefit from skilled therapeutic intervention in order to improve the following deficits and impairments:  Abnormal gait,Difficulty walking,Decreased balance,Decreased mobility,Decreased strength,Postural dysfunction  Visit Diagnosis: Other abnormalities of gait and mobility  Unsteadiness on feet  Other symptoms and signs involving the nervous system     Problem List Patient Active Problem List   Diagnosis Date Noted  . Parkinson's disease ([place]) 03/29/2021  . History of prostate cancer 12/21/2011    Frazier Butt. 04/27/2021, 11:30 AM  Frazier Butt., PT   [facility] [address] [place], [place], [address] Phone: [phone]   Fax:  [phone]  Name: DANTRE YEARWOOD MRN: [id number] Date of Birth: 18-Aug-1956

## 2021-05-01 ENCOUNTER — Encounter: Payer: Self-pay | Admitting: Physical Therapy

## 2021-05-01 ENCOUNTER — Other Ambulatory Visit: Payer: Self-pay

## 2021-05-01 ENCOUNTER — Ambulatory Visit: Payer: Medicare PPO | Admitting: Occupational Therapy

## 2021-05-01 ENCOUNTER — Ambulatory Visit: Payer: Medicare PPO | Admitting: Physical Therapy

## 2021-05-01 DIAGNOSIS — R2689 Other abnormalities of gait and mobility: Secondary | ICD-10-CM

## 2021-05-01 DIAGNOSIS — M6281 Muscle weakness (generalized): Secondary | ICD-10-CM

## 2021-05-01 DIAGNOSIS — R2681 Unsteadiness on feet: Secondary | ICD-10-CM

## 2021-05-01 DIAGNOSIS — R29818 Other symptoms and signs involving the nervous system: Secondary | ICD-10-CM

## 2021-05-01 NOTE — Patient Instructions (Addendum)
Optimal Fitness Program after Therapy for People with Parkinson's Disease  1)  Therapy Home Exercise Program             -Do these Exercises DAILY as instructed by your therapist             -Big, deliberate effort with exercises             -These exercises are important to perform consistently, even when therapist has  finished, because these therapy exercises often address your specific         Parkinson's difficulties  STANDING AND SEATED PWR MOVES    2)  Walking             -  Work up to walking 3-5 times per week, 20-30 minutes per day             -This can be done at home, driveway, quiet street or an indoor track             -Focus should be on your Best posture, arm swing, step length for your best walking pattern  3)  Aerobic Exercise             -Work up to 3-5 times per week, 30 minutes per day             -This can be stationary bike, seated stepper machine, elliptical machine             -Work up to 7-8/10 intensity during the exercise, at minimal to moderate                 Resistance    PWR! Hands  With arms stretched out in front of you (elbows straight), perform the following:  PWR! Up: Close hands and flick fingers open and apart BIG  PWR! Rock: Move wrists up and down [employer]! Twist: Twist palms up and down BIG  PWR! Step: Touch index finger to thumb while keeping other fingers straight. Flick fingers out BIG (thumb out/straighten fingers). Repeat with other fingers. (Step your thumb to each finger).  PWR! Hands: Push hands out BIG. Elbows straight, wrists up, fingers open and spread apart BIG. (Can also perform by pushing down on table, chair, knees. Push above head, out to the side, behind you, in front of you.)   Make each movement big and deliberate so that you feel the movement.  Perform at least 10 repetitions 1x/day, but perform PWR! hands throughout the day when you are having trouble using your hands (picking up/manipulating small objects,  writing, eating, typing, sewing, buttoning, etc.).

## 2021-05-01 NOTE — Therapy (Signed)
[facility] [address] [place], [place], [address] Phone: [phone]   Fax:  [phone]  Physical Therapy Treatment  Patient Details  Name: James Collier MRN: [id number] Date of Birth: 1956-07-20 Referring Provider (PT): Tat, Wells Guiles   Encounter Date: 05/01/2021   PT End of Session - 05/01/21 1429    Visit Number 7    Number of Visits 10    Date for PT Re-Evaluation 06/09/21    Authorization Type [employer]    Authorization Time Period 04/10/21-06/09/21    Authorization - Visit Number 6    Authorization - Number of Visits 10    Progress Note Due on Visit 10    PT Start Time 1318    PT Stop Time 1402    PT Time Calculation (min) 44 min    Activity Tolerance Patient tolerated treatment well    Behavior During Therapy [facility] for tasks assessed/performed           Past Medical History:  Diagnosis Date  . Allergy   . Blood in stool   . Cancer [facility]) 2012   prostate    Past Surgical History:  Procedure Laterality Date  . CATARACT EXTRACTION Right   . HEMORROIDECTOMY  1986  . PROSTATE SURGERY  2011  . TONSILLECTOMY  1965    There were no vitals filed for this visit.   Subjective Assessment - 05/01/21 1320    Subjective Had a busy weekend.    Patient Stated Goals Pt's goal for therapy is to get a baseline.    Currently in Pain? No/denies                             [facility] Adult PT Treatment/Exercise - 05/01/21 0001      Therapeutic Activites    Therapeutic Activities Other Therapeutic Activities    Other Therapeutic Activities discussed with pt optimal fitness program for PD as well as importance of continuing to perform after therapy. printed out handout for pt and went through each section and examples of each as well as how often to perform each week and for how long; 1) PD specific exercises (PWR moves), 2) walking program, 3) aerobic exercise (pt using his seated airdyne bike at home).  pt verbalized understanding, just a matter of doing them      Exercises   Exercises Other Exercises    Other Exercises  pt asked what kind of stretches he could do at home for his neck as it feels stiff - discussed use of PWR moves as well to use for gentle ROM with looking towards hands and for PWR Up to help work postural muscles that support neck and for pt to be more aware of posture throughout the day. had pt perform gentle ear to shoulder stretch and levator scap stretch, cues for gentle ROM and to hold for approx. 20 seconds for a stretch. pt demonstrating how he performs stretches at home (holds for less than 5 seconds each).           Pt continues to report incr B hand stiffness throughout the day and reports that he has been performing his own version of PWR flick. Performed PWR Hands and rationale of performing and when to perform throughout the day (esp in morning when hands are stiff or after driving or before doing any fine motor/coordination tasks).   PWR! Up: Close hands and flick fingers open and apart BIG  PWR! Rock: Move wrists up and down BIG  PWR! Twist: Twist palms up and down BIG  PWR! Step: Touch index finger to thumb while keeping other fingers straight. Flick fingers out BIG (thumb out/straighten fingers). Repeat with other fingers. (Step your thumb to each finger).     Reviewed PWR! Moves insittingposition (pt reports not performing consistently as HEP)  PWR! Up for improved posture10 reps  PWR! Rock for improved weighshifting2 x 5 reps Bilat, first with rocking and reaching and then additional x5 reps B adding out a kicking of the leg (pt unable to get leg completely straight, does keep it a little bent)  PWR! Twist for improved trunk rotation2 x 5 reps Bilat- cues to stop and reset in midline with tall posture each time before twisting and to use hips/feet to help pivot towards other side  PWR! Step for improved step initiation10 reps Bilat-   step out and out and step in and in.  Reviewed technique and level of intensity when performing at home.    Standing PWR Flow: x5 reps Up > Rock > Twist > Step  With therapist providing demo cues in front of patient for proper technique.  For Rock, needs cues for wider BOS and reaching up towards hand vs. Initially taking a step. For Step, pt needing cues for incr foot clearance, esp when stepping back to midline      PT Education - 05/01/21 1429    Education Details handout for optimal fitness program for PD and explanations, PWR hands    Person(s) Educated Patient    Methods Explanation;Demonstration;Handout    Comprehension Verbalized understanding;Returned demonstration               PT Long Term Goals - 04/10/21 1359      PT LONG TERM GOAL #1   Title Pt will be independent with HEP for improved strength, balance, transfers, and gait.  TARGET 05/12/2021    Time 5    Period Weeks    Status New      PT LONG TERM GOAL #2   Title Pt will improve 5x sit<>stand to less than or equal to 15 sec to demonstrate improved functional strength and transfer efficiency.    Baseline 21.44 sec    Time 5    Period Weeks    Status New      PT LONG TERM GOAL #3   Title Pt will improve TUG score to less than or equal to 13.5 sec for decreased dall risk.    Baseline 14.75 sec    Time 5    Period Weeks    Status New      PT LONG TERM GOAL #4   Title Pt will improve MiniBESTest score to at least 25/28 for decreased fall risk.    Baseline 23/28    Time 5    Period Weeks    Status New      PT LONG TERM GOAL #5   Title Pt will verbalize understanding of fall prevention in home enviroment.    Time 5    Period Weeks    Status New      Additional Long Term Goals   Additional Long Term Goals Yes      PT LONG TERM GOAL #6   Title Pt will verbalize understanding of local Parkinson's disease-related resources, including ongoing community fitness upon d/c from PT.    Time 5     Status New  Plan - 05/01/21 1430    Clinical Impression Statement Reviewed optimal fitness program for PD today and provided handout going oer each aspect. Provided pt with PWR Hands today as pt reports incr B hand stiffness. Reviewed seated PWR moves today as pt has not been consistently performing at home, with pt needing initial reminder cues for technique. Will continue to progress towards LTGs.    Personal Factors and Comorbidities Comorbidity 1    Comorbidities Hx of prostate cancer    Examination-Activity Limitations Locomotion Level;Transfers;Stand    Examination-Participation Restrictions Community Activity;Yard Work;Other   travel/hiking   Stability/Clinical Decision Making Evolving/Moderate complexity    Rehab Potential Good    PT Frequency 2x / week    PT Duration Other (comment)   5 weeks, including eval week   PT Treatment/Interventions ADLs/Self Care Home Management;Gait training;Stair training;Functional mobility training;Therapeutic activities;Therapeutic exercise;Balance training;Neuromuscular re-education;Patient/family education;Manual techniques    PT Next Visit Plan review standing/seated PWR as appropriate - continue to try standing PWR flow. use of aerobic machines, sit<>stands; gait on compliant surfaces. dual tasking with gait. how were gentle neck stretches?  pt liked handout for optimal fitness program for PD - see if pt would need more of a chart format.    Consulted and Agree with Plan of Care Patient           Patient will benefit from skilled therapeutic intervention in order to improve the following deficits and impairments:  Abnormal gait,Difficulty walking,Decreased balance,Decreased mobility,Decreased strength,Postural dysfunction  Visit Diagnosis: Other abnormalities of gait and mobility  Unsteadiness on feet  Other symptoms and signs involving the nervous system  Muscle weakness (generalized)     Problem List Patient  Active Problem List   Diagnosis Date Noted  . Parkinson's disease ([facility]) 03/29/2021  . History of prostate cancer 12/21/2011    Arliss Journey, PT, DPT  05/01/2021, 2:32 PM  [facility] [address] [place], [place], [address] Phone: [phone]   Fax:  [phone]  Name: James Collier MRN: [id number] Date of Birth: Jun 23, 1956

## 2021-05-04 ENCOUNTER — Ambulatory Visit: Payer: Medicare PPO | Admitting: Occupational Therapy

## 2021-05-04 ENCOUNTER — Other Ambulatory Visit: Payer: Self-pay

## 2021-05-04 DIAGNOSIS — R2689 Other abnormalities of gait and mobility: Secondary | ICD-10-CM

## 2021-05-04 DIAGNOSIS — M6281 Muscle weakness (generalized): Secondary | ICD-10-CM

## 2021-05-04 DIAGNOSIS — R2681 Unsteadiness on feet: Secondary | ICD-10-CM

## 2021-05-04 DIAGNOSIS — R278 Other lack of coordination: Secondary | ICD-10-CM

## 2021-05-04 DIAGNOSIS — M25512 Pain in left shoulder: Secondary | ICD-10-CM

## 2021-05-04 DIAGNOSIS — R29818 Other symptoms and signs involving the nervous system: Secondary | ICD-10-CM

## 2021-05-04 NOTE — Therapy (Signed)
[facility] [address] [place], [place], [address] Phone: [phone]   Fax:  [phone]  Occupational Therapy Evaluation  Patient Details  Name: James Collier MRN: [id number] Date of Birth: 1956-09-02 Referring Provider (OT): Dr. Arbutus Leas  Encounter Date: 05/04/2021   OT End of Session - 05/04/21 1225     Visit Number 1    Number of Visits 11    Date for OT Re-Evaluation 06/08/21    Authorization Type [employer]    Authorization - Visit Number 1    Authorization - Number of Visits 10    Progress Note Due on Visit 10    OT Start Time 1018    OT Stop Time 1100    OT Time Calculation (min) 42 min             Past Medical History:  Diagnosis Date   Allergy    Blood in stool    Cancer ([facility]) 2012   prostate    Past Surgical History:  Procedure Laterality Date   CATARACT EXTRACTION Right    HEMORROIDECTOMY  1986   PROSTATE SURGERY  2011   TONSILLECTOMY  1965    There were no vitals filed for this visit.   Subjective Assessment - 05/04/21 0938     Patient Stated Goals Improve my daily activities    Currently in Pain? Yes    Pain Score 4     Pain Location Shoulder    Pain Orientation Left    Pain Descriptors / Indicators Aching    Pain Type Chronic pain    Pain Onset More than a month ago    Pain Frequency Intermittent    Aggravating Factors  malpositioning    Pain Relieving Factors unknown               [facility] OT Assessment - 05/04/21 0940       Assessment   Medical Diagnosis Parkinson's disease    Referring Provider (OT) Dr. Arbutus Leas    Onset Date/Surgical Date 04/17/21    Hand Dominance Left      Precautions   Precautions Fall      Balance Screen   Has the patient fallen in the past 6 months No    Has the patient had a decrease in activity level because of a fear of falling?  No    Is the patient reluctant to leave their home because of a fear of falling?  No      Home   Environment   Family/patient expects to be discharged to: Private residence    Lives With Spouse      Prior Function   Level of Independence Independent    Vocation Retired    Leisure [employer] uses push-mower weekly; walks his dogs, enjoys mechanical things; enjoys driving his large truck and that is more difficult; has a farm in [place] that he enjoys camping on the land.  Enjoys hiking and being outside.  Has an Airdyne bike, and uses several days per week.      ADL   Eating/Feeding Modified independent    Grooming Modified independent    Upper Body Bathing Modified independent   difficulty washing hair   Lower Body Bathing Modified independent    Upper Body Dressing Needs assist for fasteners;Independent   difficulty with buttons   Lower Body Dressing Modified independent   difficulty with socks   Toilet Transfer Modified independent    Tub/Shower Transfer Modified independent  grab bar, walk in shower     IADL   Shopping Shops independently for small purchases    Light Housekeeping Performs light daily tasks such as dishwashing, bed making    Meal Prep Able to complete simple warm meal prep    Medication Management Is responsible for taking medication in correct dosages at correct time   has alarm   Financial Management --   wife handles     Mobility   Mobility Status Independent      Written Expression   Dominant Hand Left    Handwriting 100% legible      Cognition   Overall Cognitive Status Cognition to be further assessed in functional context PRN   denies changes, increased time to respond to questions   Memory Impaired      Observation/Other Assessments   Physical Performance Test   Yes    Simulated Eating Time (seconds) 12.31    Donning Doffing Jacket Time (seconds) 8.87 secs    Donning Doffing Jacket Comments 3 button/ unbutton: 35.56      Posture/Postural Control   Posture/Postural Control Postural limitations    Postural Limitations Forward head       Sensation   Light Touch Impaired by gross assessment   fingertips     Coordination   9 Hole Peg Test Right;Left    Right 9 Hole Peg Test 31.47    Left 9 Hole Peg Test 31.60    Box and Blocks RUE 45, LUE 47      Tone   Assessment Location Left Upper Extremity      ROM / Strength   AROM / PROM / Strength AROM      AROM   Overall AROM  Deficits    Overall AROM Comments RUE shoulder flexion 130, elbow extension -5, LUE shoulder flexion 130, elbow extension -20, rigidity and anterior shoulder positioning left      LUE Tone   LUE Tone Mild   rigidity, scapular winging                              OT Short Term Goals - 05/04/21 1341       OT SHORT TERM GOAL #1   Title I with PD specific HEP    Time 4    Period Weeks      OT SHORT TERM GOAL #2   Title Pt will verbalize understanding of adapted strategies to maximize safety and I with ADLs/ IADLs .    Time 4    Period Weeks    Status New      OT SHORT TERM GOAL #3   Title Pt will  demonstrate ability to retrieve a lightweight object form over head sheldf with -10 elbow extension and pain no greater than 3/10.    Time 4    Period Weeks    Status New      OT SHORT TERM GOAL #4   Title Pt will demonstrate improved bilateral UE functional use as evidenced by increasing bilateral box/ blocks score by 3 blocks    Baseline RUE 47, LUE 45    Time 4    Period Weeks    Status New               OT Long Term Goals - 05/04/21 1250       OT LONG TERM GOAL #1   Title Pt will verbalize understanding of ways to prevent  future PD related complications and PD community resources.    Time 8    Period Weeks    Status New      OT LONG TERM GOAL #2   Title Pt will demonstrate improved ease with dressing as eveidenced by decreasing 3 button/ unbutton time to 32 secs or less    Time 8    Period Weeks    Status New      OT LONG TERM GOAL #3   Title Pt will demonstrate understanding of memory compensations  and ways to keep thinking skills sharp    Time 8    Period Weeks    Status New      OT LONG TERM GOAL #4   Title Pt will report less difficulty with donning socks and pants and pt will perform in a reasonable amount of time    Time 8    Period Weeks    Status New                   Plan - 05/04/21 0957     Clinical Impression Statement Pt is a 65 y.o male, newly diagnosed with PD with  PMH: prostate CA.Pt presents with the following deficits:  decreased coordination, left shoulder pain, rigidity, decreased balance, decreased functional mobility,  short term memory deficits bradykinesia. Pt can benefit from skilled occupational therapy to address these deficits in order to maximize pt's safety and I with daily activities.    OT Occupational Profile and History Problem Focused Assessment - Including review of records relating to presenting problem    Occupational performance deficits (Please refer to evaluation for details): ADL's;IADL's;Play;Leisure;Social Participation    Body Structure / Function / Physical Skills ADL;Endurance;UE functional use;Balance;Flexibility;Pain;FMC;ROM;Gait;Coordination;GMC;Decreased knowledge of precautions;Decreased knowledge of use of DME;IADL;Dexterity;Strength;Mobility    Cognitive Skills Memory    Rehab Potential Good    Clinical Decision Making Limited treatment options, no task modification necessary    Comorbidities Affecting Occupational Performance: May have comorbidities impacting occupational performance    Modification or Assistance to Complete Evaluation  No modification of tasks or assist necessary to complete eval    OT Frequency 2x / week   anticipate d/c afet 6 weeks   OT Duration 8 weeks    OT Treatment/Interventions Self-care/ADL training;Ultrasound;Energy conservation;Patient/family education;DME and/or AE instruction;Aquatic Therapy;Paraffin;Gait Training;Passive range of motion;Balance training;Fluidtherapy;Cryotherapy;Electrical  Stimulation;Functional Mobility Training;Splinting;Therapeutic activities;Manual Therapy;Therapeutic exercise;Moist Heat;Neuromuscular education;Cognitive remediation/compensation    Plan initate coordination HEP and ADL strategies for buttons, socks and pants    Consulted and Agree with Plan of Care Patient             Patient will benefit from skilled therapeutic intervention in order to improve the following deficits and impairments:   Body Structure / Function / Physical Skills: ADL, Endurance, UE functional use, Balance, Flexibility, Pain, FMC, ROM, Gait, Coordination, GMC, Decreased knowledge of precautions, Decreased knowledge of use of DME, IADL, Dexterity, Strength, Mobility Cognitive Skills: Memory     Visit Diagnosis: Acute pain of left shoulder  Other symptoms and signs involving the nervous system  Muscle weakness (generalized)  Other lack of coordination  Unsteadiness on feet  Other abnormalities of gait and mobility    Problem List Patient Active Problem List   Diagnosis Date Noted   Parkinson's disease ([facility]) 03/29/2021   History of prostate cancer 12/21/2011    Karene Bracken 05/04/2021, 1:54 PM  [facility] [address] [place], [place], [address] Phone: [phone]   Fax:  [phone]  Name: DEMARIEN SERPE MRN: [id number] Date of Birth: 05/27/1956

## 2021-05-05 ENCOUNTER — Ambulatory Visit: Payer: Medicare PPO | Admitting: Physical Therapy

## 2021-05-08 ENCOUNTER — Ambulatory Visit: Payer: Medicare PPO

## 2021-05-08 ENCOUNTER — Other Ambulatory Visit: Payer: Self-pay

## 2021-05-08 DIAGNOSIS — R29818 Other symptoms and signs involving the nervous system: Secondary | ICD-10-CM

## 2021-05-08 DIAGNOSIS — R2689 Other abnormalities of gait and mobility: Secondary | ICD-10-CM

## 2021-05-08 DIAGNOSIS — G20 Parkinson's disease: Secondary | ICD-10-CM

## 2021-05-08 DIAGNOSIS — M6281 Muscle weakness (generalized): Secondary | ICD-10-CM

## 2021-05-08 DIAGNOSIS — R2681 Unsteadiness on feet: Secondary | ICD-10-CM

## 2021-05-08 NOTE — Therapy (Signed)
[facility] [address] [place], [place], [address] Phone: [phone]   Fax:  [phone]  Physical Therapy Treatment  Patient Details  Name: James Collier MRN: [id number] Date of Birth: 23-Aug-1956 Referring Provider (PT): Tat, Wells Guiles   Encounter Date: 05/08/2021   PT End of Session - 05/08/21 0939     Visit Number 8    Number of Visits 10    Date for PT Re-Evaluation 06/09/21    Authorization Type [employer]    Authorization Time Period 04/10/21-06/09/21    Authorization - Visit Number 7    Authorization - Number of Visits 10    Progress Note Due on Visit 10    PT Start Time 0930    PT Stop Time 1015    PT Time Calculation (min) 45 min    Activity Tolerance Patient tolerated treatment well    Behavior During Therapy [facility] for tasks assessed/performed             Past Medical History:  Diagnosis Date   Allergy    Blood in stool    Cancer ([facility]) 2012   prostate    Past Surgical History:  Procedure Laterality Date   CATARACT EXTRACTION Right    [place]    There were no vitals filed for this visit.   Subjective Assessment - 05/08/21 0941     Subjective I am very stiff in the morning so it is harder for me to get moving. I already walked 30 min before I came to PT today.    Patient Stated Goals Pt's goal for therapy is to get a baseline.                   Sci Fit: level 5 for 10' Lower trunk rotations: 15x 5" holds Knee to chest: 5 x 15" holds R and L Piriformis stretch: 5 x 15" R and L Pt educated on milder stretch for longer time for increased effectiveness. Sit to stand Grade IV patella cephalic and caudal mobilization 2-3" for assessment purpose mainly- Limited laterally apharlically demo how to perform side lying quad stretch                      PT Long Term Goals - 04/10/21 1359       PT LONG TERM  GOAL #1   Title Pt will be independent with HEP for improved strength, balance, transfers, and gait.  TARGET 05/12/2021    Time 5    Period Weeks    Status New      PT LONG TERM GOAL #2   Title Pt will improve 5x sit<>stand to less than or equal to 15 sec to demonstrate improved functional strength and transfer efficiency.    Baseline 21.44 sec    Time 5    Period Weeks    Status New      PT LONG TERM GOAL #3   Title Pt will improve TUG score to less than or equal to 13.5 sec for decreased dall risk.    Baseline 14.75 sec    Time 5    Period Weeks    Status New      PT LONG TERM GOAL #4   Title Pt will improve MiniBESTest score to at least 25/28 for decreased fall risk.    Baseline 23/28    Time 5    Period Weeks  Status New      PT LONG TERM GOAL #5   Title Pt will verbalize understanding of fall prevention in home enviroment.    Time 5    Period Weeks    Status New      Additional Long Term Goals   Additional Long Term Goals Yes      PT LONG TERM GOAL #6   Title Pt will verbalize understanding of local Parkinson's disease-related resources, including ongoing community fitness upon d/c from PT.    Time 5    Status New                   Plan - 05/08/21 0941     Clinical Impression Statement Skilled PT was focused on cardivascualar endurance. Demo. patient some stretching exercies in bed to help loosen up when he wakes up to see if improved flexibility helps him to move better in the morning.    Personal Factors and Comorbidities Comorbidity 1    Comorbidities Hx of prostate cancer    Examination-Activity Limitations Locomotion Level;Transfers;Stand    Examination-Participation Restrictions Community Activity;Yard Work;Other   travel/hiking   Stability/Clinical Decision Making Evolving/Moderate complexity    Rehab Potential Good    PT Frequency 2x / week    PT Duration Other (comment)   5 weeks, including eval week   PT Treatment/Interventions ADLs/Self  Care Home Management;Gait training;Stair training;Functional mobility training;Therapeutic activities;Therapeutic exercise;Balance training;Neuromuscular re-education;Patient/family education;Manual techniques    PT Next Visit Plan Has pt tried hip/back/knee stretches? do they feel better in morning? how is the knee feeling? review standing/seated PWR as appropriate - continue to try standing PWR flow. use of aerobic machines, sit<>stands; gait on compliant surfaces. dual tasking with gait. how were gentle neck stretches?  pt liked handout for optimal fitness program for PD - see if pt would need more of a chart format.    PT Home Exercise Plan Access Code [email or web address]    Consulted and Agree with Plan of Care Patient             Patient will benefit from skilled therapeutic intervention in order to improve the following deficits and impairments:  Abnormal gait, Difficulty walking, Decreased balance, Decreased mobility, Decreased strength, Postural dysfunction  Visit Diagnosis: Other symptoms and signs involving the nervous system  Muscle weakness (generalized)  Unsteadiness on feet  Other abnormalities of gait and mobility  Parkinson's disease [facility])     Problem List Patient Active Problem List   Diagnosis Date Noted   Parkinson's disease ([facility]) 03/29/2021   History of prostate cancer 12/21/2011    Kerrie Pleasure, PT 05/08/2021, 10:24 AM  [facility] [address] [place], [place], [address] Phone: [phone]   Fax:  [phone]  Name: James Collier MRN: [id number] Date of Birth: 10-31-1956

## 2021-05-10 ENCOUNTER — Encounter: Payer: Self-pay | Admitting: Physical Therapy

## 2021-05-10 ENCOUNTER — Other Ambulatory Visit: Payer: Self-pay

## 2021-05-10 ENCOUNTER — Ambulatory Visit: Payer: Medicare PPO | Admitting: Physical Therapy

## 2021-05-10 DIAGNOSIS — R2681 Unsteadiness on feet: Secondary | ICD-10-CM

## 2021-05-10 DIAGNOSIS — R2689 Other abnormalities of gait and mobility: Secondary | ICD-10-CM

## 2021-05-10 DIAGNOSIS — R29818 Other symptoms and signs involving the nervous system: Secondary | ICD-10-CM

## 2021-05-10 DIAGNOSIS — M6281 Muscle weakness (generalized): Secondary | ICD-10-CM

## 2021-05-10 NOTE — Therapy (Addendum)
[facility] [address] [place], [place], [address] Phone: [phone]   Fax:  [phone]  Physical Therapy Treatment/Discharge Summary  Patient Details  Name: James Collier MRN: [id number] Date of Birth: 1956-06-27 Referring Provider (PT): Tat, Lurena Joiner   Encounter Date: 05/10/2021   PT End of Session - 05/10/21 1746     Visit Number 9    Number of Visits 10    Date for PT Re-Evaluation 06/09/21    Authorization Type [employer]    Authorization Time Period 04/10/21-06/09/21    Authorization - Visit Number 8    Authorization - Number of Visits 10    Progress Note Due on Visit 10    PT Start Time 1317    PT Stop Time 1358    PT Time Calculation (min) 41 min    Activity Tolerance Patient tolerated treatment well    Behavior During Therapy [facility] for tasks assessed/performed             Past Medical History:  Diagnosis Date   Allergy    Blood in stool    Cancer (HCC) 2012   prostate    Past Surgical History:  Procedure Laterality Date   CATARACT EXTRACTION Right    HEMORROIDECTOMY  1986   PROSTATE SURGERY  2011   TONSILLECTOMY  1965    There were no vitals filed for this visit.   Subjective Assessment - 05/10/21 1320     Subjective Doing good today. Did some stretches this morning before he got out of bed and reports that they felt good. Haven't been riding his exercise bike, last time was about a week ago.    Patient Stated Goals Pt's goal for therapy is to get a baseline.                [facility] PT Assessment - 05/10/21 1331       Transfers   Sit to Stand 5: Supervision;With upper extremity assist;From chair/3-in-1    Five time sit to stand comments  16.9 seconds with UE support and without    Stand to Sit 5: Supervision;Without upper extremity assist;To chair/3-in-1      Mini-BESTest   Sit To Stand Normal: Comes to stand without use of hands and stabilizes independently.    Rise to Toes  Normal: Stable for 3 s with maximum height.    Stand on one leg (left) Normal: 20 s.    Stand on one leg (right) Normal: 20 s.   21.75 seconds   Stand on one leg - lowest score 2    Compensatory Stepping Correction - Forward Normal: Recovers independently with a single, large step (second realignement is allowed).    Compensatory Stepping Correction - Backward Normal: Recovers independently with a single, large step    Compensatory Stepping Correction - Left Lateral Moderate: Several steps to recover equilibrium   2 steps   Compensatory Stepping Correction - Right Lateral Moderate: Several steps to recover equilibrium   2 steps   Stepping Corredtion Lateral - lowest score 1    Stance - Feet together, eyes open, firm surface  Normal: 30s    Stance - Feet together, eyes closed, foam surface  Normal: 30s    Incline - Eyes Closed Normal: Stands independently 30s and aligns with gravity    Change in Gait Speed Normal: Significantly changes walkling speed without imbalance    Walk with head turns - Horizontal Moderate: performs head turns with reduction in gait speed.  Walk with pivot turns Normal: Turns with feet close FAST (< 3 steps) with good balance.    Step over obstacles Normal: Able to step over box with minimal change of gait speed and with good balance.    Timed UP & GO with Dual Task Normal: No noticeable change in sitting, standing or walking while backward counting when compared to TUG without    Mini-BEST total score 26      Timed Up and Go Test   Normal TUG (seconds) 7.1                           [facility] Adult PT Treatment/Exercise - 05/10/21 1331       Therapeutic Activites    Therapeutic Activities Other Therapeutic Activities    Other Therapeutic Activities had continued discussion with pt about optimal PD fitness program after therapy, pt reports that he understands the importance of exercises and he feels better with therapy and after doing them, but it is a  matter of him performing the exercises. Also verbalizes importance of continuing his airdyne bike working at an intensity level of 7/10, as well as walking program (focusing on stride length and arm swing). Pt will begin with OT at this time, discussed with pt following up after OT in ~6 months for return PD screen/eval with pt reports that he is not sure which one he would like at this time, will decide at end of bout of OT. Pt reporting that he has been doing PWR hands with weights in his hands - educated pt on purpose of these exercises are for BIG movement patterns and opening up his hands and it would not be appropriate for him to use weights while performing these.                    PT Education - 05/10/21 1746     Education Details see TA    Person(s) Educated Patient    Methods Explanation    Comprehension Verbalized understanding             PHYSICAL THERAPY DISCHARGE SUMMARY  Visits from Start of Care: 9  Current functional level related to goals / functional outcomes: See clinical impression statement/LTGs.   Remaining deficits: Impaired balance, postural abnormalities.    Education / Equipment: HEP    Patient agrees to discharge. Patient goals were met. Patient is being discharged due to meeting the stated rehab goals.      PT Long Term Goals - 05/10/21 1339       PT LONG TERM GOAL #1   Title Pt will be independent with HEP for improved strength, balance, transfers, and gait.  TARGET 05/12/2021    Baseline pt reports performing his PWR moves at home    Time 5    Period Weeks    Status Achieved      PT LONG TERM GOAL #2   Title Pt will improve 5x sit<>stand to less than or equal to 15 sec to demonstrate improved functional strength and transfer efficiency.    Baseline 21.44 sec; 16.9 seconds with UE support and without on 05/10/21    Time 5    Period Weeks    Status Not Met      PT LONG TERM GOAL #3   Title Pt will improve TUG score to less  than or equal to 13.5 sec for decreased dall risk.    Baseline 14.75 sec; 7.1 seconds  on 05/10/21    Time 5    Period Weeks    Status Achieved      PT LONG TERM GOAL #4   Title Pt will improve MiniBESTest score to at least 25/28 for decreased fall risk.    Baseline 23/28 - 26/28 on 05/10/21    Time 5    Period Weeks    Status Achieved      PT LONG TERM GOAL #5   Title Pt will verbalize understanding of fall prevention in home enviroment.    Time 5    Period Weeks    Status New      PT LONG TERM GOAL #6   Title Pt will verbalize understanding of local Parkinson's disease-related resources, including ongoing community fitness upon d/c from PT.    Baseline pt given handout about community resources, pt reports not interested at this time pursuing ongoing community fitness, but pt understands where to access resources.    Time 5    Status Partially Met                   Plan - 05/11/21 0805     Clinical Impression Statement Focus of today's skilled session was assessing pt's LTGs. Pt met LTG #1,3, and 4. Pt reports that he has been performing his standing PWR moves at home (more so the standing ones vs. seated), mentioned switching every other day with which one he performs. Pt improved TUG score to 7.1 seconds (previously 14.75 seconds) and improved miniBEST to 26/28 (previously 23/28). Pt with improvement of sit <> stand time - performing with/and without UE support, but not quite to goal level. Pt has been provided and gone over community resources with pt, but he has not taken another look at it at this time. Pt is pleased with his progress with therapy and feels like he is moving better and he verbalizes understanding of importance of continued exercise, he just has to put in the work and do the exercises. Pt to see OT at this time, at the end of OT will decide between return PD screen vs. eval. Will D/C from PT at this time due to progress towards goals, finalized HEP, and pt  being pleased with his progress.    Personal Factors and Comorbidities Comorbidity 1    Comorbidities Hx of prostate cancer    Examination-Activity Limitations Locomotion Level;Transfers;Stand    Examination-Participation Restrictions Community Activity;Yard Work;Other   travel/hiking   Stability/Clinical Decision Making Evolving/Moderate complexity    Rehab Potential Good    PT Frequency 2x / week    PT Duration Other (comment)   5 weeks, including eval week   PT Treatment/Interventions ADLs/Self Care Home Management;Gait training;Stair training;Functional mobility training;Therapeutic activities;Therapeutic exercise;Balance training;Neuromuscular re-education;Patient/family education;Manual techniques    PT Next Visit Plan d/c    PT Home Exercise Plan Access Code [email or web address] - SEATED AND STANDING PWR MOVES    Consulted and Agree with Plan of Care Patient             Patient will benefit from skilled therapeutic intervention in order to improve the following deficits and impairments:  Abnormal gait, Difficulty walking, Decreased balance, Decreased mobility, Decreased strength, Postural dysfunction  Visit Diagnosis: Other symptoms and signs involving the nervous system  Muscle weakness (generalized)  Unsteadiness on feet  Other abnormalities of gait and mobility     Problem List Patient Active Problem List   Diagnosis Date Noted   Parkinson's disease ([place]) 03/29/2021   History  of prostate cancer 12/21/2011    Arliss Journey, PT, DPT 05/11/2021, 8:09 AM  [facility] [address] [place], [place], [address] Phone: [phone]   Fax:  [phone]  Name: RAYSHUN KANDLER MRN: [id number] Date of Birth: [birth year]

## 2021-05-17 ENCOUNTER — Ambulatory Visit: Payer: Medicare PPO | Admitting: Occupational Therapy

## 2021-05-17 ENCOUNTER — Other Ambulatory Visit: Payer: Self-pay

## 2021-05-17 DIAGNOSIS — M6281 Muscle weakness (generalized): Secondary | ICD-10-CM

## 2021-05-17 DIAGNOSIS — R29818 Other symptoms and signs involving the nervous system: Secondary | ICD-10-CM

## 2021-05-17 DIAGNOSIS — R2681 Unsteadiness on feet: Secondary | ICD-10-CM

## 2021-05-17 DIAGNOSIS — R2689 Other abnormalities of gait and mobility: Secondary | ICD-10-CM | POA: Diagnosis not present

## 2021-05-17 DIAGNOSIS — R278 Other lack of coordination: Secondary | ICD-10-CM

## 2021-05-17 NOTE — Patient Instructions (Signed)
PWR! Hands  With arms stretched out in front of you (elbows straight), perform the following: PWR! Rock: Move wrists up and down [employer]! Twist: Twist palms up and down BIG  Then, start with elbows bent and hands closed. PWR! Step: Touch index finger to thumb while keeping other fingers straight. Flick fingers out BIG (thumb out/straighten fingers). Repeat with other fingers. (Step your thumb to each finger). PWR! Hands: Push hands out BIG. Elbows straight, wrists up, fingers open and spread apart BIG. (Can also perform by pushing down on table, chair, knees. Push above head, out to the side, behind you, in front of you.)   ** Make each movement big and deliberate so that you feel the movement.  Perform at least 10 repetitions 1x/day, but perform PWR! hands throughout the day when you are having trouble using your hands (picking up/manipulating small objects, writing, eating, typing, sewing, buttoning, etc.).   Coordination Exercises  Perform the following exercises for 20 minutes 1 times per day. Perform with both hand(s). Perform using big movements.  Flipping Cards: Place deck of cards on the table. Flip cards over by opening your hand big to grasp and then turn your palm up big. Deal cards: Hold 1/2 or whole deck in your hand. Use thumb to push card off top of deck with one big push. Rotate ball with fingertips: Pick up with fingers/thumb and move as much as you can with each turn/movement (clockwise and counter-clockwise). Toss ball from one hand to the other: Toss big/high. Toss ball in the air and catch with the same hand: Toss big/high. Pick up coins and place in coin bank or container: Pick up with big, intentional movements. Do not drag coin to the edge. Pick up coins and stack one at a time: Pick up with big, intentional movements. Do not drag coin to the edge. (5-10 in a stack) Pick up 5-10 coins one at a time and hold in palm. Then, move coins from palm to fingertips one at  time and place in coin bank/container. Practice writing: Slow down, write big, and focus on forming each letter. Perform "Flicks"/hand stretches (PWR! Hands): Close hands then flick out your fingers with focus on opening hands, pulling wrists back, and extending elbows like you are pushing.

## 2021-05-17 NOTE — Therapy (Signed)
[facility] [address] [place], [place], [address] Phone: [phone]   Fax:  [phone]  Occupational Therapy Treatment  Patient Details  Name: James Collier MRN: [id number] Date of Birth: 02-13-1956 Referring Provider (OT): Dr. Arbutus Leas   Encounter Date: 05/17/2021   OT End of Session - 05/17/21 1021     Visit Number 2    Number of Visits 17    Date for OT Re-Evaluation 06/29/21    Authorization Type [employer]    Authorization - Visit Number 2    Authorization - Number of Visits 10    OT Start Time [phone]    OT Stop Time 1015    OT Time Calculation (min) 41 min    Behavior During Therapy [facility] for tasks assessed/performed             Past Medical History:  Diagnosis Date   Allergy    Blood in stool    Cancer (HCC) 2012   prostate    Past Surgical History:  Procedure Laterality Date   CATARACT EXTRACTION Right    HEMORROIDECTOMY  1986   PROSTATE SURGERY  2011   TONSILLECTOMY  1965    There were no vitals filed for this visit.   Subjective Assessment - 05/17/21 0935     Patient Stated Goals Improve my daily activities    Currently in Pain? No/denies                                  OT Education - 05/17/21 1021     Education Details PWR! hands, coordination HEP    Person(s) Educated Patient    Methods Explanation;Demonstration;Verbal cues;Handout    Comprehension Verbalized understanding;Returned demonstration;Verbal cues required              OT Short Term Goals - 05/04/21 1341       OT SHORT TERM GOAL #1   Title I with PD specific HEP    Time 4    Period Weeks      OT SHORT TERM GOAL #2   Title Pt will verbalize understanding of adapted strategies to maximize safety and I with ADLs/ IADLs .    Time 4    Period Weeks    Status New      OT SHORT TERM GOAL #3   Title Pt will  demonstrate ability to retrieve a lightweight object form over head  sheldf with -10 elbow extension and pain no greater than 3/10.    Time 4    Period Weeks    Status New      OT SHORT TERM GOAL #4   Title Pt will demonstrate improved bilateral UE functional use as evidenced by increasing bilateral box/ blocks score by 3 blocks    Baseline RUE 47, LUE 45    Time 4    Period Weeks    Status New               OT Long Term Goals - 05/04/21 1250       OT LONG TERM GOAL #1   Title Pt will verbalize understanding of ways to prevent future PD related complications and PD community resources.    Time 8    Period Weeks    Status New      OT LONG TERM GOAL #2   Title Pt will demonstrate improved ease with dressing as eveidenced by  decreasing 3 button/ unbutton time to 32 secs or less    Time 8    Period Weeks    Status New      OT LONG TERM GOAL #3   Title Pt will demonstrate understanding of memory compensations and ways to keep thinking skills sharp    Time 8    Period Weeks    Status New      OT LONG TERM GOAL #4   Title Pt will report less difficulty with donning socks and pants and pt will perform in a reasonable amount of time    Time 8    Period Weeks    Status New                   Plan - 05/17/21 1438     Clinical Impression Statement Pt demonstrates understanding of coordination HEP and PWR!hands.    OT Occupational Profile and History Problem Focused Assessment - Including review of records relating to presenting problem    Occupational performance deficits (Please refer to evaluation for details): ADL's;IADL's;Play;Leisure;Social Participation    Body Structure / Function / Physical Skills ADL;Endurance;UE functional use;Balance;Flexibility;Pain;FMC;ROM;Gait;Coordination;GMC;Decreased knowledge of precautions;Decreased knowledge of use of DME;IADL;Dexterity;Strength;Mobility    OT Frequency 2x / week    OT Duration 8 weeks    OT Treatment/Interventions Self-care/ADL training;Ultrasound;Energy  conservation;Patient/family education;DME and/or AE instruction;Aquatic Therapy;Paraffin;Gait Training;Passive range of motion;Balance training;Fluidtherapy;Cryotherapy;Electrical Stimulation;Functional Mobility Training;Splinting;Therapeutic activities;Manual Therapy;Therapeutic exercise;Moist Heat;Neuromuscular education;Cognitive remediation/compensation    Plan ADL strategies fo buttons, socks and pants    Consulted and Agree with Plan of Care Patient             Patient will benefit from skilled therapeutic intervention in order to improve the following deficits and impairments:   Body Structure / Function / Physical Skills: ADL, Endurance, UE functional use, Balance, Flexibility, Pain, FMC, ROM, Gait, Coordination, GMC, Decreased knowledge of precautions, Decreased knowledge of use of DME, IADL, Dexterity, Strength, Mobility       Visit Diagnosis: Other symptoms and signs involving the nervous system  Muscle weakness (generalized)  Unsteadiness on feet  Other lack of coordination    Problem List Patient Active Problem List   Diagnosis Date Noted   Parkinson's disease (HCC) 03/29/2021   History of prostate cancer 12/21/2011    Wanza Szumski 05/17/2021, 3:43 PM Keene Breath, OTR/L [phone] Phone: [phone] 3:43 PM 05/17/21  [facility] [address] [place], [place], [address] Phone: [phone]   Fax:  [phone]  Name: James Collier MRN: [id number] Date of Birth: 17-Dec-1955

## 2021-05-22 ENCOUNTER — Ambulatory Visit: Payer: Medicare PPO | Admitting: Occupational Therapy

## 2021-05-22 ENCOUNTER — Other Ambulatory Visit: Payer: Self-pay

## 2021-05-22 DIAGNOSIS — R278 Other lack of coordination: Secondary | ICD-10-CM

## 2021-05-22 DIAGNOSIS — R29818 Other symptoms and signs involving the nervous system: Secondary | ICD-10-CM

## 2021-05-22 DIAGNOSIS — R2689 Other abnormalities of gait and mobility: Secondary | ICD-10-CM | POA: Diagnosis not present

## 2021-05-22 DIAGNOSIS — R2681 Unsteadiness on feet: Secondary | ICD-10-CM

## 2021-05-22 NOTE — Therapy (Signed)
[facility] [address] [place], [place], [address] Phone: [phone]   Fax:  [phone]  Occupational Therapy Treatment  Patient Details  Name: James Collier MRN: [id number] Date of Birth: 03/23/1956 Referring Provider (OT): Dr. Carles Collet   Encounter Date: 05/22/2021   OT End of Session - 05/22/21 0937     Visit Number 3    Number of Visits 17    Date for OT Re-Evaluation 06/29/21    Authorization Type [employer]    Authorization - Visit Number 3    Authorization - Number of Visits 10    Progress Note Due on Visit 10    OT Start Time [phone]    OT Stop Time 1019    OT Time Calculation (min) 43 min    Activity Tolerance Patient tolerated treatment well    Behavior During Therapy [facility] for tasks assessed/performed             Past Medical History:  Diagnosis Date   Allergy    Blood in stool    Cancer ([place]) 2012   prostate    Past Surgical History:  Procedure Laterality Date   CATARACT EXTRACTION Right    [place]    There were no vitals filed for this visit.   Subjective Assessment - 05/22/21 0937     Subjective  I'm ok today.  I'm doing the exercises, but not as consistent.    Patient Stated Goals Improve my daily activities    Currently in Pain? No/denies    Pain Onset More than a month ago               Pt instructed in large amplitude movement strategies/adaptive strategies for ADLs:  Recommended pt cross legs, scrunch socks, and spread hands apart big when donning sock.  Recommended pt sit, hold pants with big hand and use big movements for donning pants.  Recommended that pt gather shirt and maintain upright posture when donning pull over shirt.  Simulated ADLs with bag with focus/min cues for large amplitude movements:  Donning/doffing pull-over shirt, donning/doffing pants, pulling bag into hand for clothing  adjustment/donning socks.  Recommended stretching prior to dressing in the mornings and discussed ways to incorporate into daily routines.  Also discussed importance of HEP and large amplitude movements during ADLs for calibration of movement/incr carryover.  Recommeneed pt Standing with feet further apart for decr fatigues, incr comfort with standing for longer periods.  Also discussed posture when sitting which may be contributing to back pain.    Recommended pt initiate movement for Washing hair/hands with LUE only first then add in RUE for these bilateral tasks to encourage more deliberate/large amplitude movements with LUE.      Pt reports that brushing teeth was very hard prior to therapy, uses electric toothbrush now.         OT Education - 05/22/21 1143     Education Details Began instruction regarding use of large amplitude movement strategies    Person(s) Educated Patient    Methods Explanation;Demonstration    Comprehension Verbalized understanding              OT Short Term Goals - 05/04/21 1341       OT SHORT TERM GOAL #1   Title I with PD specific HEP    Time 4    Period Weeks      OT SHORT TERM  GOAL #2   Title Pt will verbalize understanding of adapted strategies to maximize safety and I with ADLs/ IADLs .    Time 4    Period Weeks    Status New      OT SHORT TERM GOAL #3   Title Pt will  demonstrate ability to retrieve a lightweight object form over head sheldf with -10 elbow extension and pain no greater than 3/10.    Time 4    Period Weeks    Status New      OT SHORT TERM GOAL #4   Title Pt will demonstrate improved bilateral UE functional use as evidenced by increasing bilateral box/ blocks score by 3 blocks    Baseline RUE 47, LUE 45    Time 4    Period Weeks    Status New               OT Long Term Goals - 05/04/21 1250       OT LONG TERM GOAL #1   Title Pt will verbalize understanding of ways to prevent future PD related  complications and PD community resources.    Time 8    Period Weeks    Status New      OT LONG TERM GOAL #2   Title Pt will demonstrate improved ease with dressing as eveidenced by decreasing 3 button/ unbutton time to 32 secs or less    Time 8    Period Weeks    Status New      OT LONG TERM GOAL #3   Title Pt will demonstrate understanding of memory compensations and ways to keep thinking skills sharp    Time 8    Period Weeks    Status New      OT LONG TERM GOAL #4   Title Pt will report less difficulty with donning socks and pants and pt will perform in a reasonable amount of time    Time 8    Period Weeks    Status New                   Plan - 05/22/21 6789     Clinical Impression Statement Pt is progressing towards goal with improving understanding of strategies for dressing.  Emphasized importance (and needs reinforcement) of HEP and large amplitude movement strategies for functional tasks.    OT Occupational Profile and History Problem Focused Assessment - Including review of records relating to presenting problem    Occupational performance deficits (Please refer to evaluation for details): ADL's;IADL's;Play;Leisure;Social Participation    Body Structure / Function / Physical Skills ADL;Endurance;UE functional use;Balance;Flexibility;Pain;FMC;ROM;Gait;Coordination;GMC;Decreased knowledge of precautions;Decreased knowledge of use of DME;IADL;Dexterity;Strength;Mobility    OT Frequency 2x / week    OT Duration 8 weeks    OT Treatment/Interventions Self-care/ADL training;Ultrasound;Energy conservation;Patient/family education;DME and/or AE instruction;Aquatic Therapy;Paraffin;Gait Training;Passive range of motion;Balance training;Fluidtherapy;Cryotherapy;Electrical Stimulation;Functional Mobility Training;Splinting;Therapeutic activities;Manual Therapy;Therapeutic exercise;Moist Heat;Neuromuscular education;Cognitive remediation/compensation    Plan review strategies  for socks/pants, instruct in buttoning strategies    OT Home Exercise Plan --    Consulted and Agree with Plan of Care Patient             Patient will benefit from skilled therapeutic intervention in order to improve the following deficits and impairments:   Body Structure / Function / Physical Skills: ADL, Endurance, UE functional use, Balance, Flexibility, Pain, FMC, ROM, Gait, Coordination, GMC, Decreased knowledge of precautions, Decreased knowledge of use of DME, IADL, Dexterity, Strength, Mobility  Visit Diagnosis: Other symptoms and signs involving the nervous system  Unsteadiness on feet  Other lack of coordination  Other abnormalities of gait and mobility    Problem List Patient Active Problem List   Diagnosis Date Noted   Parkinson's disease ([place]) 03/29/2021   History of prostate cancer 12/21/2011    [facility] 05/22/2021, 12:38 PM  [facility] [address] [place], [place], [address] Phone: [phone]   Fax:  [phone]  Name: James Collier MRN: [id number] Date of Birth: [birth year]  Vianne Bulls, OTR/L [facility] [address] [place], [place]  [address] [phone] phone [phone] 05/22/21 12:38 PM

## 2021-05-25 ENCOUNTER — Ambulatory Visit: Payer: Medicare PPO | Admitting: Occupational Therapy

## 2021-05-30 ENCOUNTER — Ambulatory Visit: Payer: Medicare PPO | Attending: Neurology | Admitting: Occupational Therapy

## 2021-05-30 ENCOUNTER — Other Ambulatory Visit: Payer: Self-pay

## 2021-05-30 DIAGNOSIS — R29818 Other symptoms and signs involving the nervous system: Secondary | ICD-10-CM | POA: Insufficient documentation

## 2021-05-30 DIAGNOSIS — R2681 Unsteadiness on feet: Secondary | ICD-10-CM | POA: Insufficient documentation

## 2021-05-30 DIAGNOSIS — R278 Other lack of coordination: Secondary | ICD-10-CM | POA: Insufficient documentation

## 2021-05-30 DIAGNOSIS — M25512 Pain in left shoulder: Secondary | ICD-10-CM | POA: Insufficient documentation

## 2021-05-30 DIAGNOSIS — M6281 Muscle weakness (generalized): Secondary | ICD-10-CM | POA: Diagnosis not present

## 2021-05-30 DIAGNOSIS — R2689 Other abnormalities of gait and mobility: Secondary | ICD-10-CM | POA: Diagnosis not present

## 2021-05-30 NOTE — Therapy (Signed)
[facility] [address] [place], [place], [address] Phone: [phone]   Fax:  [phone]  Occupational Therapy Treatment  Patient Details  Name: James Collier MRN: [id number] Date of Birth: [birth year] Referring Provider (OT): Dr. Carles Collet   Encounter Date: 05/30/2021   OT End of Session - 05/30/21 0809     Visit Number 4    Number of Visits 17    Date for OT Re-Evaluation 06/29/21    Authorization Type [employer]    Authorization - Visit Number 4    Authorization - Number of Visits 10    Progress Note Due on Visit 10    OT Start Time 0806    OT Stop Time 0845    OT Time Calculation (min) 39 min    Activity Tolerance Patient tolerated treatment well    Behavior During Therapy [facility] for tasks assessed/performed             Past Medical History:  Diagnosis Date   Allergy    Blood in stool    Cancer ([place]) 2012   prostate    Past Surgical History:  Procedure Laterality Date   CATARACT EXTRACTION Right    [place]    There were no vitals filed for this visit.   Subjective Assessment - 05/30/21 0807     Subjective  I remembered my socks    Patient Stated Goals Improve my daily activities    Currently in Pain? No/denies    Pain Onset More than a month ago               Reviewed dressing strategies (including donning pull-over shirt, donning pants, buttoning) and practiced donning socks (crossing legs and scrunching socks), donning/doffing jacket (gown) using large amplitude movement strategies (emphasizing trunk rotation).  Pt instructed in use of trunk rotations/turning prior to sitting up to decr strain on back (supine>sit) transfer.   Pt returned demo.   Reviewed recommendation to avoid prolonged posterior pelvic tilt in sitting.  Practiced sit>stand with forward reach with UEs after instruction, pt returned demo.  Began  reviewing handout for large amplitude movement strategies for ADLs and purpose to incr ease and decr risk for future complications (emphasized dressing strategies, putting on seatbelt with turning and cutting strategies per pt questions).--will continue to review next session.         OT Education - 05/30/21 [phone]     Education Details Large amplitude movement strategies for ADLs--issued handout    Person(s) Educated Patient    Methods Explanation;Verbal cues;Handout;Demonstration    Comprehension Verbalized understanding;Returned demonstration              OT Short Term Goals - 05/04/21 1341       OT SHORT TERM GOAL #1   Title I with PD specific HEP    Time 4    Period Weeks      OT SHORT TERM GOAL #2   Title Pt will verbalize understanding of adapted strategies to maximize safety and I with ADLs/ IADLs .    Time 4    Period Weeks    Status New      OT SHORT TERM GOAL #3   Title Pt will  demonstrate ability to retrieve a lightweight object form over head sheldf with -10 elbow extension and pain no greater than 3/10.    Time 4    Period Weeks  Status New      OT SHORT TERM GOAL #4   Title Pt will demonstrate improved bilateral UE functional use as evidenced by increasing bilateral box/ blocks score by 3 blocks    Baseline RUE 47, LUE 45    Time 4    Period Weeks    Status New               OT Long Term Goals - 05/04/21 1250       OT LONG TERM GOAL #1   Title Pt will verbalize understanding of ways to prevent future PD related complications and PD community resources.    Time 8    Period Weeks    Status New      OT LONG TERM GOAL #2   Title Pt will demonstrate improved ease with dressing as eveidenced by decreasing 3 button/ unbutton time to 32 secs or less    Time 8    Period Weeks    Status New      OT LONG TERM GOAL #3   Title Pt will demonstrate understanding of memory compensations and ways to keep thinking skills sharp    Time 8    Period  Weeks    Status New      OT LONG TERM GOAL #4   Title Pt will report less difficulty with donning socks and pants and pt will perform in a reasonable amount of time    Time 8    Period Weeks    Status New                   Plan - 05/30/21 0926     Clinical Impression Statement Pt is progressing towards goals with improving awareness of movement patterns and use of large amplitude movement patterns to decr risk of future complications.    OT Occupational Profile and History Problem Focused Assessment - Including review of records relating to presenting problem    Occupational performance deficits (Please refer to evaluation for details): ADL's;IADL's;Play;Leisure;Social Participation    Body Structure / Function / Physical Skills ADL;Endurance;UE functional use;Balance;Flexibility;Pain;FMC;ROM;Gait;Coordination;GMC;Decreased knowledge of precautions;Decreased knowledge of use of DME;IADL;Dexterity;Strength;Mobility    OT Frequency 2x / week    OT Duration 8 weeks    OT Treatment/Interventions Self-care/ADL training;Ultrasound;Energy conservation;Patient/family education;DME and/or AE instruction;Aquatic Therapy;Paraffin;Gait Training;Passive range of motion;Balance training;Fluidtherapy;Cryotherapy;Electrical Stimulation;Functional Mobility Training;Splinting;Therapeutic activities;Manual Therapy;Therapeutic exercise;Moist Heat;Neuromuscular education;Cognitive remediation/compensation    Plan continue to address large amplitude movements for ADLs (practice buttoning, opening containers, etc).    Consulted and Agree with Plan of Care Patient             Patient will benefit from skilled therapeutic intervention in order to improve the following deficits and impairments:   Body Structure / Function / Physical Skills: ADL, Endurance, UE functional use, Balance, Flexibility, Pain, FMC, ROM, Gait, Coordination, GMC, Decreased knowledge of precautions, Decreased knowledge of use of  DME, IADL, Dexterity, Strength, Mobility       Visit Diagnosis: Other symptoms and signs involving the nervous system  Unsteadiness on feet  Other lack of coordination  Other abnormalities of gait and mobility    Problem List Patient Active Problem List   Diagnosis Date Noted   Parkinson's disease ([place]) 03/29/2021   History of prostate cancer 12/21/2011    [facility] 05/30/2021, 10:51 AM  [facility] [address] [place], [place], [address] Phone: [phone]   Fax:  [phone]  Name: James Collier MRN: [id number]  Date of Birth: [birth year]   Vianne Bulls, OTR/L [facility] [address] [place], [place]  [address] [phone] phone [phone] 05/30/21 10:51 AM

## 2021-05-30 NOTE — Patient Instructions (Addendum)
Performing Daily Activities with Big Movements  Pick at least 2 activities a day and perform with BIG, DELIBERATE movements/effort.  Try different activities each day. This can make the activity easier and turn daily activities into exercise to prevent problems in the future!  If you are standing during the activity, make sure to keep feet apart and stand with good/big/PWR! UP posture.  Examples: Dressing  Push arms in sleeves, twist when putting on jacket, good posture and bring shirt to head (don't bring head down) with pull over shirt (scrunch shirts) push foot into pants deliberately, sit down open hands to pull down shirt/put on socks/pull up pants Cross legs when putting on socks, scrunch socks Buttoning - Open hands big (PWR! Hands) before fastening each button, deliberate movement (angry buttons) Bathing - Wash/dry with long strokes Brushing your teeth - Big, slow movements Cutting food - Long deliberate cuts, put tip of knife down in front of food Eating - Hold utensil in the middle, not the end, hold fork straight up and down to stab food Picking up a cup/bottle - Open hand up big and get object all the way in palm Opening jar/bottle - Move as much as you can with each turn, twist wrist Putting on seatbelt - Twist when reaching, look at where you are reaching Hanging up clothes/getting clothes down from closet - Reach with big effort, open hand, straighten elbow Putting away groceries/dishes - Reach with big effort Wiping counter/table - Move in big, long strokes, open hand Stirring while cooking - Exaggerate movement Cleaning windows - Move in big, long strokes Sweeping/Raking- Move arms in big, long strokes, feet apart and one in front of the other Vacuuming - Push with big movement, feet apart and one in front of the other Folding clothes - Exaggerate arm movements Washing car - Move in big, long strokes, feet apart Changing light bulb or Using a screwdriver - Move as much  as you can with each turn, twist wrist Walking into a store/restaurant - Walk with big steps, swing arms if able Standing up from a chair/recliner/sofa - Scoot forward, lean forward, and stand with big effort Picking up something from floor/reaching in low cabinet - Get close to object, Position feet apart and one in front of the other.

## 2021-06-01 ENCOUNTER — Other Ambulatory Visit: Payer: Self-pay

## 2021-06-01 ENCOUNTER — Ambulatory Visit: Payer: Medicare PPO | Admitting: Occupational Therapy

## 2021-06-01 DIAGNOSIS — R2681 Unsteadiness on feet: Secondary | ICD-10-CM

## 2021-06-01 DIAGNOSIS — R29818 Other symptoms and signs involving the nervous system: Secondary | ICD-10-CM

## 2021-06-01 DIAGNOSIS — R278 Other lack of coordination: Secondary | ICD-10-CM | POA: Diagnosis not present

## 2021-06-01 DIAGNOSIS — R2689 Other abnormalities of gait and mobility: Secondary | ICD-10-CM | POA: Diagnosis not present

## 2021-06-01 DIAGNOSIS — M25512 Pain in left shoulder: Secondary | ICD-10-CM | POA: Diagnosis not present

## 2021-06-01 DIAGNOSIS — M6281 Muscle weakness (generalized): Secondary | ICD-10-CM | POA: Diagnosis not present

## 2021-06-01 NOTE — Therapy (Signed)
[facility] [address] [place], [place], [address] Phone: [phone]   Fax:  [phone]  Occupational Therapy Treatment  Patient Details  Name: James Collier MRN: [id number] Date of Birth: 22-Jun-1956 Referring Provider (OT): Dr. Carles Collet   Encounter Date: 06/01/2021   OT End of Session - 06/01/21 0853     Visit Number 5    Number of Visits 17    Date for OT Re-Evaluation 06/29/21    Authorization Type [employer]    Authorization - Visit Number 5    Authorization - Number of Visits 10    Progress Note Due on Visit 10    OT Start Time 0850    OT Stop Time 0930    OT Time Calculation (min) 40 min    Activity Tolerance Patient tolerated treatment well    Behavior During Therapy [facility] for tasks assessed/performed             Past Medical History:  Diagnosis Date   Allergy    Blood in stool    Cancer ([place]) 2012   prostate    Past Surgical History:  Procedure Laterality Date   CATARACT EXTRACTION Right    [place]    There were no vitals filed for this visit.   Subjective Assessment - 06/01/21 0852     Patient Stated Goals Improve my daily activities    Currently in Pain? No/denies    Pain Onset More than a month ago             Practice of large amplitude movement strategies including:  Picking up items from floor with min cueing after instruction, grasping cylinder objects, opening bottles.  Pt reports falling asleep multiple times during the day and poor sleep at night and that neck hurts when waking up (in chair).  Recommended scheduled naps that are timed (suggested start with 2 for 30min each) and consider travel neck pillow when watching tv.  Also recommended discussing sleep difficulties with MD.          OT Education - 06/01/21 1052     Education Details Continued education regarding large amplitude movement  strategies for ADLs/completed handout review and discussed importance for decr risk of future complications.    Person(s) Educated Patient    Methods Explanation;Demonstration    Comprehension Verbalized understanding;Returned demonstration              OT Short Term Goals - 05/04/21 1341       OT SHORT TERM GOAL #1   Title I with PD specific HEP    Time 4    Period Weeks      OT SHORT TERM GOAL #2   Title Pt will verbalize understanding of adapted strategies to maximize safety and I with ADLs/ IADLs .    Time 4    Period Weeks    Status New      OT SHORT TERM GOAL #3   Title Pt will  demonstrate ability to retrieve a lightweight object form over head sheldf with -10 elbow extension and pain no greater than 3/10.    Time 4    Period Weeks    Status New      OT SHORT TERM GOAL #4   Title Pt will demonstrate improved bilateral UE functional use as evidenced by increasing bilateral box/ blocks score by 3 blocks    Baseline RUE 47,  LUE 45    Time 4    Period Weeks    Status New               OT Long Term Goals - 05/04/21 1250       OT LONG TERM GOAL #1   Title Pt will verbalize understanding of ways to prevent future PD related complications and PD community resources.    Time 8    Period Weeks    Status New      OT LONG TERM GOAL #2   Title Pt will demonstrate improved ease with dressing as eveidenced by decreasing 3 button/ unbutton time to 32 secs or less    Time 8    Period Weeks    Status New      OT LONG TERM GOAL #3   Title Pt will demonstrate understanding of memory compensations and ways to keep thinking skills sharp    Time 8    Period Weeks    Status New      OT LONG TERM GOAL #4   Title Pt will report less difficulty with donning socks and pants and pt will perform in a reasonable amount of time    Time 8    Period Weeks    Status New                   Plan - 06/01/21 1052     Clinical Impression Statement Pt is progressing  towards goals with improving awareness of movement patterns and use of large amplitude movement patterns to decr risk of future complications.    OT Occupational Profile and History Problem Focused Assessment - Including review of records relating to presenting problem    Occupational performance deficits (Please refer to evaluation for details): ADL's;IADL's;Play;Leisure;Social Participation    Body Structure / Function / Physical Skills ADL;Endurance;UE functional use;Balance;Flexibility;Pain;FMC;ROM;Gait;Coordination;GMC;Decreased knowledge of precautions;Decreased knowledge of use of DME;IADL;Dexterity;Strength;Mobility    OT Frequency 2x / week    OT Duration 8 weeks    OT Treatment/Interventions Self-care/ADL training;Ultrasound;Energy conservation;Patient/family education;DME and/or AE instruction;Aquatic Therapy;Paraffin;Gait Training;Passive range of motion;Balance training;Fluidtherapy;Cryotherapy;Electrical Stimulation;Functional Mobility Training;Splinting;Therapeutic activities;Manual Therapy;Therapeutic exercise;Moist Heat;Neuromuscular education;Cognitive remediation/compensation    Plan begin checking STGs; continue to address large amplitude movements for ADLs (practice buttoning, reach and grasp, step and reach, etc); ways to decr risk of future complications    Consulted and Agree with Plan of Care Patient             Patient will benefit from skilled therapeutic intervention in order to improve the following deficits and impairments:   Body Structure / Function / Physical Skills: ADL, Endurance, UE functional use, Balance, Flexibility, Pain, FMC, ROM, Gait, Coordination, GMC, Decreased knowledge of precautions, Decreased knowledge of use of DME, IADL, Dexterity, Strength, Mobility       Visit Diagnosis: Other symptoms and signs involving the nervous system  Unsteadiness on feet  Other lack of coordination    Problem List Patient Active Problem List   Diagnosis  Date Noted   Parkinson's disease ([place]) 03/29/2021   History of prostate cancer 12/21/2011    [facility] 06/01/2021, 3:39 PM  [facility] [address] [place], [place], [address] Phone: [phone]   Fax:  [phone]  Name: James Collier MRN: [id number] Date of Birth: Dec 21, 1955  Vianne Bulls, OTR/L [facility] [address] [place], [place]  [address] [phone] phone [phone] 06/01/21 3:39 PM

## 2021-06-06 ENCOUNTER — Ambulatory Visit: Payer: Medicare PPO | Admitting: Occupational Therapy

## 2021-06-06 ENCOUNTER — Other Ambulatory Visit: Payer: Self-pay

## 2021-06-06 ENCOUNTER — Encounter: Payer: Self-pay | Admitting: Occupational Therapy

## 2021-06-06 DIAGNOSIS — R2689 Other abnormalities of gait and mobility: Secondary | ICD-10-CM

## 2021-06-06 DIAGNOSIS — R278 Other lack of coordination: Secondary | ICD-10-CM | POA: Diagnosis not present

## 2021-06-06 DIAGNOSIS — R29818 Other symptoms and signs involving the nervous system: Secondary | ICD-10-CM | POA: Diagnosis not present

## 2021-06-06 DIAGNOSIS — M25512 Pain in left shoulder: Secondary | ICD-10-CM | POA: Diagnosis not present

## 2021-06-06 DIAGNOSIS — M6281 Muscle weakness (generalized): Secondary | ICD-10-CM

## 2021-06-06 DIAGNOSIS — R2681 Unsteadiness on feet: Secondary | ICD-10-CM

## 2021-06-06 NOTE — Therapy (Signed)
[facility] [address] [place], [place], [address] Phone: [phone]   Fax:  [phone]  Occupational Therapy Treatment  Patient Details  Name: James Collier MRN: [id number] Date of Birth: 12/29/1955 Referring Provider (OT): Dr. Arbutus Leas   Encounter Date: 06/06/2021   OT End of Session - 06/06/21 1024     Visit Number 6    Number of Visits 17    Date for OT Re-Evaluation 06/29/21    Authorization Type [employer]    Authorization - Visit Number 6    Authorization - Number of Visits 10    Progress Note Due on Visit 10    OT Start Time 1022    OT Stop Time 1100    OT Time Calculation (min) 38 min             Past Medical History:  Diagnosis Date   Allergy    Blood in stool    Cancer ([facility]) 2012   prostate    Past Surgical History:  Procedure Laterality Date   CATARACT EXTRACTION Right    HEMORROIDECTOMY  1986   PROSTATE SURGERY  2011   TONSILLECTOMY  1965    There were no vitals filed for this visit.   Subjective Assessment - 06/06/21 1024     Subjective  Pt reports mild neck pain    Patient Stated Goals Improve my daily activities    Currently in Pain? No/denies                      Treatment: Therapist started checking progress towards goals, see goals for progress.            OT Education - 06/06/21 1035     Education Details PWR! moves basic 4 seated, 10 reps each, PWR! hands basic 4, 10 reps each, discussed importance of regular exercise, education regarding big movements with fastening buttons    Person(s) Educated Patient    Methods Explanation;Demonstration    Comprehension Verbalized understanding;Returned demonstration;Verbal cues required              OT Short Term Goals - 06/06/21 1032       OT SHORT TERM GOAL #1   Title I with PD specific HEP    Time 4    Period Weeks    Status On-going   needs reinforcement, not perfroming consistently      OT SHORT TERM GOAL #2   Title Pt will verbalize understanding of adapted strategies to maximize safety and I with ADLs/ IADLs .    Time 4    Period Weeks    Status On-going      OT SHORT TERM GOAL #3   Title Pt will  demonstrate ability to retrieve a lightweight object form over head sheldf with -10 elbow extension and pain no greater than 3/10.    Time 4    Period Weeks    Status Achieved   met pain in shoulder 1/10     OT SHORT TERM GOAL #4   Title Pt will demonstrate improved bilateral UE functional use as evidenced by increasing bilateral box/ blocks score by 3 blocks    Baseline RUE 47, LUE 45    Time 4    Period Weeks    Status Achieved   RUE 56 LUE 53              OT Long Term Goals - 06/06/21 1034  OT LONG TERM GOAL #1   Title Pt will verbalize understanding of ways to prevent future PD related complications and PD community resources.    Time 8    Period Weeks    Status New      OT LONG TERM GOAL #2   Title Pt will demonstrate improved ease with dressing as eveidenced by decreasing 3 button/ unbutton time to 32 secs or less    Time 8    Period Weeks    Status On-going   38.06     OT LONG TERM GOAL #3   Title Pt will demonstrate understanding of memory compensations and ways to keep thinking skills sharp    Time 8    Period Weeks    Status New      OT LONG TERM GOAL #4   Title Pt will report less difficulty with donning socks and pants and pt will perform in a reasonable amount of time    Time 8    Period Weeks    Status New                   Plan - 06/06/21 1214     Clinical Impression Statement Pt is progressing towards goals. He met his box/ blocks goal. However he reports decreased motivation at times and inconsistent follow though of exercises at home.    OT Occupational Profile and History Problem Focused Assessment - Including review of records relating to presenting problem    Occupational performance deficits (Please refer  to evaluation for details): ADL's;IADL's;Play;Leisure;Social Participation    Body Structure / Function / Physical Skills ADL;Endurance;UE functional use;Balance;Flexibility;Pain;FMC;ROM;Gait;Coordination;GMC;Decreased knowledge of precautions;Decreased knowledge of use of DME;IADL;Dexterity;Strength;Mobility    OT Frequency 2x / week    OT Duration 8 weeks    OT Treatment/Interventions Self-care/ADL training;Ultrasound;Energy conservation;Patient/family education;DME and/or AE instruction;Aquatic Therapy;Paraffin;Gait Training;Passive range of motion;Balance training;Fluidtherapy;Cryotherapy;Electrical Stimulation;Functional Mobility Training;Splinting;Therapeutic activities;Manual Therapy;Therapeutic exercise;Moist Heat;Neuromuscular education;Cognitive remediation/compensation    Plan begin checking STGs;  exercise flow sheet, continue to address large amplitude movements for ADLs, reach and grasp, step and reach, etc); ways to decr risk of future complications    Consulted and Agree with Plan of Care Patient             Patient will benefit from skilled therapeutic intervention in order to improve the following deficits and impairments:   Body Structure / Function / Physical Skills: ADL, Endurance, UE functional use, Balance, Flexibility, Pain, FMC, ROM, Gait, Coordination, GMC, Decreased knowledge of precautions, Decreased knowledge of use of DME, IADL, Dexterity, Strength, Mobility       Visit Diagnosis: Other symptoms and signs involving the nervous system  Unsteadiness on feet  Other abnormalities of gait and mobility  Muscle weakness (generalized)  Other lack of coordination    Problem List Patient Active Problem List   Diagnosis Date Noted   Parkinson's disease ([facility]) 03/29/2021   History of prostate cancer 12/21/2011    Alfonse Garringer 06/06/2021, 12:18 PM  [facility] [address] [place], [place],  [address] Phone: [phone]   Fax:  [phone]  Name: James Collier MRN: [id number] Date of Birth: Jun 19, 1956

## 2021-06-08 ENCOUNTER — Ambulatory Visit: Payer: Medicare PPO | Admitting: Occupational Therapy

## 2021-06-08 ENCOUNTER — Other Ambulatory Visit: Payer: Self-pay

## 2021-06-08 DIAGNOSIS — R2689 Other abnormalities of gait and mobility: Secondary | ICD-10-CM

## 2021-06-08 DIAGNOSIS — R278 Other lack of coordination: Secondary | ICD-10-CM

## 2021-06-08 DIAGNOSIS — R2681 Unsteadiness on feet: Secondary | ICD-10-CM

## 2021-06-08 DIAGNOSIS — R29818 Other symptoms and signs involving the nervous system: Secondary | ICD-10-CM | POA: Diagnosis not present

## 2021-06-08 DIAGNOSIS — M6281 Muscle weakness (generalized): Secondary | ICD-10-CM | POA: Diagnosis not present

## 2021-06-08 DIAGNOSIS — M25512 Pain in left shoulder: Secondary | ICD-10-CM | POA: Diagnosis not present

## 2021-06-08 NOTE — Therapy (Signed)
[facility] [address] [place], [place], [address] Phone: [phone]   Fax:  [phone]  Occupational Therapy Treatment  Patient Details  Name: James Collier MRN: [id number] Date of Birth: September 12, 1956 Referring Provider (OT): Dr. Carles Collet   Encounter Date: 06/08/2021   OT End of Session - 06/08/21 1309     Visit Number 7    Number of Visits 17    Date for OT Re-Evaluation 06/29/21    Authorization Type [employer]    Authorization - Visit Number 7    Authorization - Number of Visits 10    OT Start Time 1022    OT Stop Time 1103    OT Time Calculation (min) 41 min    Activity Tolerance Patient tolerated treatment well    Behavior During Therapy [facility] for tasks assessed/performed             Past Medical History:  Diagnosis Date   Allergy    Blood in stool    Cancer ([place]) 2012   prostate    Past Surgical History:  Procedure Laterality Date   CATARACT EXTRACTION Right    [place]    There were no vitals filed for this visit.   Subjective Assessment - 06/08/21 1022     Subjective  Denies pain    Patient Stated Goals Improve my daily activities    Currently in Pain? No/denies                                  OT Education - 06/08/21 1241     Education Details PWR! moves in quadraped, 10 reps each, min v.c- need to issue handout next visit, PD exercise flow sheet and discussion about importance of regular exercise, PWR! hands basic 4 x 5 reps each, reveiwed flipping and dealing cards with big movments.    Person(s) Educated Patient    Methods Explanation;Demonstration    Comprehension Verbalized understanding;Returned demonstration;Verbal cues required              OT Short Term Goals - 06/08/21 1240       OT SHORT TERM GOAL #1   Title I with PD specific HEP    Time 4    Period Weeks    Status  On-going   needs reinforcement, not perfroming consistently     OT SHORT TERM GOAL #2   Title Pt will verbalize understanding of adapted strategies to maximize safety and I with ADLs/ IADLs .    Time 4    Period Weeks    Status On-going      OT SHORT TERM GOAL #3   Title Pt will  demonstrate ability to retrieve a lightweight object form over head sheldf with -10 elbow extension and pain no greater than 3/10.    Time 4    Period Weeks    Status Achieved   met pain in shoulder 1/10     OT SHORT TERM GOAL #4   Title Pt will demonstrate improved bilateral UE functional use as evidenced by increasing bilateral box/ blocks score by 3 blocks    Baseline RUE 47, LUE 45    Time 4    Period Weeks    Status Achieved   RUE 56 LUE 53  OT Long Term Goals - 06/08/21 1241       OT LONG TERM GOAL #1   Title Pt will verbalize understanding of ways to prevent future PD related complications and PD community resources.    Time 8    Period Weeks    Status New      OT LONG TERM GOAL #2   Title Pt will demonstrate improved ease with dressing as eveidenced by decreasing 3 button/ unbutton time to 32 secs or less    Time 8    Period Weeks    Status On-going   38.06     OT LONG TERM GOAL #3   Title Pt will demonstrate understanding of memory compensations and ways to keep thinking skills sharp    Time 8    Period Weeks    Status New      OT LONG TERM GOAL #4   Title Pt will report less difficulty with donning socks and pants and pt will perform in a reasonable amount of time    Time 8    Period Weeks    Status New                   Plan - 06/08/21 1209     Clinical Impression Statement Pt is progressing towards goals.Pt demonstrates good performance of PWR! moves in quadraped.    OT Occupational Profile and History Problem Focused Assessment - Including review of records relating to presenting problem    Occupational performance deficits (Please refer to  evaluation for details): ADL's;IADL's;Play;Leisure;Social Participation    Body Structure / Function / Physical Skills ADL;Endurance;UE functional use;Balance;Flexibility;Pain;FMC;ROM;Gait;Coordination;GMC;Decreased knowledge of precautions;Decreased knowledge of use of DME;IADL;Dexterity;Strength;Mobility    OT Frequency 2x / week    OT Duration 8 weeks    OT Treatment/Interventions Self-care/ADL training;Ultrasound;Energy conservation;Patient/family education;DME and/or AE instruction;Aquatic Therapy;Paraffin;Gait Training;Passive range of motion;Balance training;Fluidtherapy;Cryotherapy;Electrical Stimulation;Functional Mobility Training;Splinting;Therapeutic activities;Manual Therapy;Therapeutic exercise;Moist Heat;Neuromuscular education;Cognitive remediation/compensation    Plan continue checking STGs;  issue modified quadraped PWR! moves handout,  continue to address large amplitude movements for ADLs, reach and grasp, step and reach, etc); ways to decr risk of future complications    Consulted and Agree with Plan of Care Patient             Patient will benefit from skilled therapeutic intervention in order to improve the following deficits and impairments:   Body Structure / Function / Physical Skills: ADL, Endurance, UE functional use, Balance, Flexibility, Pain, FMC, ROM, Gait, Coordination, GMC, Decreased knowledge of precautions, Decreased knowledge of use of DME, IADL, Dexterity, Strength, Mobility       Visit Diagnosis: Other symptoms and signs involving the nervous system  Muscle weakness (generalized)  Other lack of coordination  Other abnormalities of gait and mobility  Unsteadiness on feet    Problem List Patient Active Problem List   Diagnosis Date Noted   Parkinson's disease ([place]) 03/29/2021   History of prostate cancer 12/21/2011    Ridley Schewe 06/08/2021, 1:10 PM Theone Murdoch, OTR/L [phone] Phone: [phone] 1:11 PM 06/08/21  [facility] [address] [place], [place], [address] Phone: [phone]   Fax:  [phone]  Name: James Collier MRN: [id number] Date of Birth: 1956/01/28

## 2021-06-08 NOTE — Patient Instructions (Signed)
(  Exercise) Monday Tuesday Wednesday Thursday Friday Saturday Sunday   PWR! seated          PWR!standing           PWR! quadraped           PWR! hands           Coordination (Cards, coins, rotate ball)           Walk           Stationary bike

## 2021-06-12 ENCOUNTER — Ambulatory Visit: Payer: Medicare PPO | Admitting: Occupational Therapy

## 2021-06-12 ENCOUNTER — Other Ambulatory Visit: Payer: Self-pay

## 2021-06-12 DIAGNOSIS — R29818 Other symptoms and signs involving the nervous system: Secondary | ICD-10-CM

## 2021-06-12 DIAGNOSIS — R2681 Unsteadiness on feet: Secondary | ICD-10-CM | POA: Diagnosis not present

## 2021-06-12 DIAGNOSIS — M6281 Muscle weakness (generalized): Secondary | ICD-10-CM | POA: Diagnosis not present

## 2021-06-12 DIAGNOSIS — R278 Other lack of coordination: Secondary | ICD-10-CM | POA: Diagnosis not present

## 2021-06-12 DIAGNOSIS — M25512 Pain in left shoulder: Secondary | ICD-10-CM | POA: Diagnosis not present

## 2021-06-12 DIAGNOSIS — R2689 Other abnormalities of gait and mobility: Secondary | ICD-10-CM

## 2021-06-12 NOTE — Patient Instructions (Signed)

## 2021-06-12 NOTE — Therapy (Signed)
[facility] [address] [place], [place], [address] Phone: [phone]   Fax:  [phone]  Occupational Therapy Treatment  Patient Details  Name: James Collier MRN: [id number] Date of Birth: 1956/03/22 Referring Provider (OT): Dr. Carles Collet   Encounter Date: 06/12/2021   OT End of Session - 06/12/21 0937     Visit Number 8    Number of Visits 17    Date for OT Re-Evaluation 06/29/21    Authorization Type [employer]    Authorization - Visit Number 8    Authorization - Number of Visits 10    OT Start Time [id number]    OT Stop Time 0104    OT Time Calculation (min) 930 min             Past Medical History:  Diagnosis Date   Allergy    Blood in stool    Cancer ([place]) 2012   prostate    Past Surgical History:  Procedure Laterality Date   CATARACT EXTRACTION Right    [place]    There were no vitals filed for this visit.   Subjective Assessment - 06/12/21 0936     Patient Stated Goals Improve my daily activities    Currently in Pain? Yes    Pain Score 2     Pain Location Neck    Pain Descriptors / Indicators Aching    Pain Type Chronic pain    Pain Onset More than a month ago    Pain Frequency Intermittent    Aggravating Factors  malpositioning    Pain Relieving Factors unknown                       Treatment: Placing grooved pegs into pegboard with right and left UE's, while holding several in hand for in hand manipulation, min difficulty/ v.c for posture.           OT Education - 06/12/21 0944     Education Details PWR! moves in quadraped, 10 reps each, min v.c- handout issued, community resources for PD, ways to prevent future PD related complications    Person(s) Educated Patient    Methods Explanation;Demonstration;Handout;Verbal cues    Comprehension Verbalized understanding;Returned demonstration;Verbal  cues required              OT Short Term Goals - 06/12/21 1009       OT SHORT TERM GOAL #1   Title I with PD specific HEP    Time 4    Period Weeks    Status Achieved      OT SHORT TERM GOAL #2   Title Pt will verbalize understanding of adapted strategies to maximize safety and I with ADLs/ IADLs .    Time 4    Period Weeks    Status On-going      OT SHORT TERM GOAL #3   Title Pt will  demonstrate ability to retrieve a lightweight object form over head sheldf with -10 elbow extension and pain no greater than 3/10.    Time 4    Period Weeks    Status Achieved   met pain in shoulder 1/10     OT SHORT TERM GOAL #4   Title Pt will demonstrate improved bilateral UE functional use as evidenced by increasing bilateral box/ blocks score by 3 blocks    Baseline RUE 47, LUE 45  Time 4    Period Weeks    Status Achieved   RUE 56 LUE 53              OT Long Term Goals - 06/12/21 1010       OT LONG TERM GOAL #1   Title Pt will verbalize understanding of ways to prevent future PD related complications and PD community resources.    Time 8    Period Weeks    Status Achieved      OT LONG TERM GOAL #2   Title Pt will demonstrate improved ease with dressing as eveidenced by decreasing 3 button/ unbutton time to 32 secs or less    Time 8    Period Weeks    Status On-going   38.06     OT LONG TERM GOAL #3   Title Pt will demonstrate understanding of memory compensations and ways to keep thinking skills sharp    Time 8    Period Weeks    Status On-going      OT LONG TERM GOAL #4   Title Pt will report less difficulty with donning socks and pants and pt will perform in a reasonable amount of time    Time 8    Period Weeks    Status On-going                   Plan - 06/12/21 0951     Clinical Impression Statement Pt is progressing towards goals.Pt reports using exercise flowsheet for more consistent performance of exercise.    OT Occupational Profile and  History Problem Focused Assessment - Including review of records relating to presenting problem    Occupational performance deficits (Please refer to evaluation for details): ADL's;IADL's;Play;Leisure;Social Participation    Body Structure / Function / Physical Skills ADL;Endurance;UE functional use;Balance;Flexibility;Pain;FMC;ROM;Gait;Coordination;GMC;Decreased knowledge of precautions;Decreased knowledge of use of DME;IADL;Dexterity;Strength;Mobility    Rehab Potential Good    OT Frequency 2x / week    OT Duration 8 weeks    OT Treatment/Interventions Self-care/ADL training;Ultrasound;Energy conservation;Patient/family education;DME and/or AE instruction;Aquatic Therapy;Paraffin;Gait Training;Passive range of motion;Balance training;Fluidtherapy;Cryotherapy;Electrical Stimulation;Functional Mobility Training;Splinting;Therapeutic activities;Manual Therapy;Therapeutic exercise;Moist Heat;Neuromuscular education;Cognitive remediation/compensation    Plan continue to address large amplitude movements for ADLs, reach and grasp, step and reach, etc); check on how donning shoes and socks is going, pt is only scheduled through next week    Consulted and Agree with Plan of Care Patient             Patient will benefit from skilled therapeutic intervention in order to improve the following deficits and impairments:   Body Structure / Function / Physical Skills: ADL, Endurance, UE functional use, Balance, Flexibility, Pain, FMC, ROM, Gait, Coordination, GMC, Decreased knowledge of precautions, Decreased knowledge of use of DME, IADL, Dexterity, Strength, Mobility       Visit Diagnosis: Other symptoms and signs involving the nervous system  Muscle weakness (generalized)  Other lack of coordination  Other abnormalities of gait and mobility  Unsteadiness on feet    Problem List Patient Active Problem List   Diagnosis Date Noted   Parkinson's disease ([place]) 03/29/2021   History of prostate  cancer 12/21/2011    James Collier 06/12/2021, 4:08 PM  [facility] [address] [place], [place], [address] Phone: [phone]   Fax:  [phone]  Name: James Collier MRN: [id number] Date of Birth: April 27, 1956

## 2021-06-15 ENCOUNTER — Encounter: Payer: Self-pay | Admitting: Occupational Therapy

## 2021-06-15 ENCOUNTER — Other Ambulatory Visit: Payer: Self-pay

## 2021-06-15 ENCOUNTER — Ambulatory Visit: Payer: Medicare PPO | Admitting: Occupational Therapy

## 2021-06-15 DIAGNOSIS — R278 Other lack of coordination: Secondary | ICD-10-CM | POA: Diagnosis not present

## 2021-06-15 DIAGNOSIS — R2681 Unsteadiness on feet: Secondary | ICD-10-CM | POA: Diagnosis not present

## 2021-06-15 DIAGNOSIS — M6281 Muscle weakness (generalized): Secondary | ICD-10-CM | POA: Diagnosis not present

## 2021-06-15 DIAGNOSIS — R2689 Other abnormalities of gait and mobility: Secondary | ICD-10-CM | POA: Diagnosis not present

## 2021-06-15 DIAGNOSIS — M25512 Pain in left shoulder: Secondary | ICD-10-CM | POA: Diagnosis not present

## 2021-06-15 DIAGNOSIS — R29818 Other symptoms and signs involving the nervous system: Secondary | ICD-10-CM

## 2021-06-15 NOTE — Patient Instructions (Signed)
Memory Compensation Strategies  Use "WARM" strategy.  W= write it down  A= associate it  R= repeat it  M= make a mental note  2.   You can keep a Social worker.  Use a 3-ring notebook with sections for the following: calendar, important names and phone numbers, medications, doctors' names/phone numbers, lists/reminders, and a section to journal what you did  each day.   3.    Use a calendar to write appointments down.  4.    Write yourself a schedule for the day.  This can be placed on the calendar or in a separate section of the Memory Notebook.  Keeping a regular schedule can help memory.  5.    Use medication organizer with sections for each day or morning/evening pills.  You may need help loading it  6.    Keep a basket, or pegboard by the door.  Place items that you need to take out with you in the basket or on the pegboard.  You may also want to  include a message board for reminders.  7.    Use sticky notes.  Place sticky notes with reminders in a place where the task is performed.  For example: " turn off the  stove" placed by the stove, "lock the door" placed on the door at eye level, " take your medications" on  the bathroom mirror or by the place where you normally take your medications.  8.    Use alarms/timers.  Use while cooking to remind yourself to check on food or as a reminder to take your medicine, or as a  reminder to make a call, or as a reminder to perform another task, etc.     Keeping Thinking Skills Sharp: 1. Jigsaw puzzles 2. Card/board games 3. Talking on the phone/social events 4. Lum[employer]com 5. Online games 6. Word searches/crossword puzzles 7.  Logic puzzles 8. Aerobic exercise (stationary bike) 9. Eating balanced diet (fruits & veggies) 10. Drink water 11. Try something new--new recipe, hobby 12. Crafts 13. Do a variety of activities that are challenging 14. Add cognitive activities to walking/exercising (think of animal/food/city with  each letter of the alphabet, counting backwards, thinking of as many vegetables as you can, etc.).--Only do this  If safe (no freezing/falls).

## 2021-06-15 NOTE — Therapy (Signed)
[facility] [address] [place], [place], [address] Phone: [phone]   Fax:  [phone]  Occupational Therapy Treatment  Patient Details  Name: James Collier MRN: [id number] Date of Birth: 01/22/1956 Referring Provider (OT): Dr. Arbutus Leas   Encounter Date: 06/15/2021   OT End of Session - 06/15/21 0953     Visit Number 9    Number of Visits 17    Date for OT Re-Evaluation 06/29/21    Authorization Type [employer]    Authorization - Visit Number 9    Authorization - Number of Visits 10    Progress Note Due on Visit 10    OT Start Time 0935    OT Stop Time 1015    OT Time Calculation (min) 40 min    Activity Tolerance Patient tolerated treatment well    Behavior During Therapy [facility] for tasks assessed/performed             Past Medical History:  Diagnosis Date   Allergy    Blood in stool    Cancer ([facility]) 2012   prostate    Past Surgical History:  Procedure Laterality Date   CATARACT EXTRACTION Right    HEMORROIDECTOMY  1986   PROSTATE SURGERY  2011   TONSILLECTOMY  1965    There were no vitals filed for this visit.   Subjective Assessment - 06/15/21 0952     Subjective  Pt reports exercising some    Patient Stated Goals Improve my daily activities    Currently in Pain? Yes    Pain Score 2     Pain Location Neck    Pain Orientation Left    Pain Descriptors / Indicators Aching    Pain Type Chronic pain    Pain Onset More than a month ago    Pain Frequency Intermittent    Aggravating Factors  malpositioning    Pain Relieving Factors unknown                       Treatment: PWR hands then flipping cards with large amplitude movements, min v.c and demonstration.            OT Education - 06/15/21 1131     Education Details PWR! moves in quadraped, 10 reps each, min v.c then pt returned demonstration. Pt/ wife were educated regarding memory compensation strategies and  ways to keep thinking skills sharp, they verbalized understanding, multi directional stepping to follow written instructions- pt returned demonstration issued as homework.    Person(s) Educated Patient    Methods Explanation;Demonstration;Handout;Verbal cues    Comprehension Verbalized understanding;Verbal cues required;Returned demonstration              OT Short Term Goals - 06/15/21 0955       OT SHORT TERM GOAL #1   Title I with PD specific HEP    Time 4    Period Weeks    Status Achieved      OT SHORT TERM GOAL #2   Title Pt will verbalize understanding of adapted strategies to maximize safety and I with ADLs/ IADLs .    Time 4    Period Weeks    Status On-going      OT SHORT TERM GOAL #3   Title Pt will  demonstrate ability to retrieve a lightweight object form over head sheldf with -10 elbow extension and pain no greater than 3/10.    Time 4    Period  Weeks    Status Achieved   met pain in shoulder 1/10     OT SHORT TERM GOAL #4   Title Pt will demonstrate improved bilateral UE functional use as evidenced by increasing bilateral box/ blocks score by 3 blocks    Baseline RUE 47, LUE 45    Time 4    Period Weeks    Status Achieved   RUE 56 LUE 53              OT Long Term Goals - 06/15/21 0955       OT LONG TERM GOAL #1   Title Pt will verbalize understanding of ways to prevent future PD related complications and PD community resources.    Time 8    Period Weeks    Status Achieved      OT LONG TERM GOAL #2   Title Pt will demonstrate improved ease with dressing as eveidenced by decreasing 3 button/ unbutton time to 32 secs or less    Time 8    Period Weeks    Status On-going   38.06     OT LONG TERM GOAL #3   Title Pt will demonstrate understanding of memory compensations and ways to keep thinking skills sharp    Time 8    Period Weeks    Status Achieved      OT LONG TERM GOAL #4   Title Pt will report less difficulty with donning socks and  pants and pt will perform in a reasonable amount of time    Time 8    Period Weeks    Status On-going                   Plan - 06/15/21 1144     Clinical Impression Statement Pt is progressing towards goals. Pt demonstrates understanding of PWR! moves in quadraped.    OT Occupational Profile and History Problem Focused Assessment - Including review of records relating to presenting problem    Occupational performance deficits (Please refer to evaluation for details): ADL's;IADL's;Play;Leisure;Social Participation    Body Structure / Function / Physical Skills ADL;Endurance;UE functional use;Balance;Flexibility;Pain;FMC;ROM;Gait;Coordination;GMC;Decreased knowledge of precautions;Decreased knowledge of use of DME;IADL;Dexterity;Strength;Mobility    Rehab Potential Good    OT Frequency 2x / week    OT Duration 8 weeks    OT Treatment/Interventions Self-care/ADL training;Ultrasound;Energy conservation;Patient/family education;DME and/or AE instruction;Aquatic Therapy;Paraffin;Gait Training;Passive range of motion;Balance training;Fluidtherapy;Cryotherapy;Electrical Stimulation;Functional Mobility Training;Splinting;Therapeutic activities;Manual Therapy;Therapeutic exercise;Moist Heat;Neuromuscular education;Cognitive remediation/compensation    Plan check on how donning shoes and socks is going,  check progress towards goals, anticipate d/c next week.    Consulted and Agree with Plan of Care Patient             Patient will benefit from skilled therapeutic intervention in order to improve the following deficits and impairments:   Body Structure / Function / Physical Skills: ADL, Endurance, UE functional use, Balance, Flexibility, Pain, FMC, ROM, Gait, Coordination, GMC, Decreased knowledge of precautions, Decreased knowledge of use of DME, IADL, Dexterity, Strength, Mobility       Visit Diagnosis: Other symptoms and signs involving the nervous system  Muscle weakness  (generalized)  Other lack of coordination  Other abnormalities of gait and mobility    Problem List Patient Active Problem List   Diagnosis Date Noted   Parkinson's disease ([facility]) 03/29/2021   History of prostate cancer 12/21/2011    Jaqulyn Chancellor 06/15/2021, 11:46 AM  [facility] [address] [facility] [address]  [place], [place], [address] Phone: [phone]   Fax:  [phone]  Name: James Collier MRN: [id number] Date of Birth: 05/21/1956

## 2021-06-19 DIAGNOSIS — L570 Actinic keratosis: Secondary | ICD-10-CM | POA: Diagnosis not present

## 2021-06-19 DIAGNOSIS — L218 Other seborrheic dermatitis: Secondary | ICD-10-CM | POA: Diagnosis not present

## 2021-06-19 DIAGNOSIS — D1801 Hemangioma of skin and subcutaneous tissue: Secondary | ICD-10-CM | POA: Diagnosis not present

## 2021-06-19 DIAGNOSIS — D225 Melanocytic nevi of trunk: Secondary | ICD-10-CM | POA: Diagnosis not present

## 2021-06-19 DIAGNOSIS — D2271 Melanocytic nevi of right lower limb, including hip: Secondary | ICD-10-CM | POA: Diagnosis not present

## 2021-06-19 DIAGNOSIS — L821 Other seborrheic keratosis: Secondary | ICD-10-CM | POA: Diagnosis not present

## 2021-06-20 ENCOUNTER — Other Ambulatory Visit: Payer: Self-pay

## 2021-06-20 ENCOUNTER — Ambulatory Visit: Payer: Medicare PPO | Admitting: Occupational Therapy

## 2021-06-20 DIAGNOSIS — R29818 Other symptoms and signs involving the nervous system: Secondary | ICD-10-CM

## 2021-06-20 DIAGNOSIS — R2689 Other abnormalities of gait and mobility: Secondary | ICD-10-CM

## 2021-06-20 DIAGNOSIS — M6281 Muscle weakness (generalized): Secondary | ICD-10-CM

## 2021-06-20 DIAGNOSIS — R278 Other lack of coordination: Secondary | ICD-10-CM

## 2021-06-20 NOTE — Therapy (Signed)
[facility] [address] [place], [place], [address] Phone: [phone]   Fax:  [phone]  Occupational Therapy Treatment  Patient Details  Name: James Collier MRN: [id number] Date of Birth: 07/12/1956 Referring Provider (OT): Dr. Arbutus Leas   Encounter Date: 06/20/2021   OT End of Session - 06/20/21 0937     Visit Number 10    Number of Visits 17    Date for OT Re-Evaluation 06/29/21    Authorization Type [employer] Medicare    Authorization - Visit Number 10    Authorization - Number of Visits 10    Progress Note Due on Visit 10    OT Start Time 0935    OT Stop Time 1015    OT Time Calculation (min) 40 min             Past Medical History:  Diagnosis Date   Allergy    Blood in stool    Cancer (HCC) 2012   prostate    Past Surgical History:  Procedure Laterality Date   CATARACT EXTRACTION Right    HEMORROIDECTOMY  1986   PROSTATE SURGERY  2011   TONSILLECTOMY  1965    There were no vitals filed for this visit.   Subjective Assessment - 06/20/21 1201     Subjective  Pt reports he did some exercises earlier today    Patient Stated Goals Improve my daily activities    Currently in Pain? No/denies                        Treatment: Dynamic step and reach to flip large playing cards with left and right UE's, min v.c for larger amplitude movements Arm bike x 5 mins level 1 for conditioning, pt maintained 40 rpm, min v.c for equal use of bilateral UE's          OT Education - 06/20/21 1013     Education Details Reviewed PWR! moves basic 4 seated 10 reps each, PWR! hands 5-10 reps each, reveiwed flipping, dealing and manipulating coins from HEP, min v.c then pt retruned demonstration              OT Short Term Goals - 06/20/21 1207       OT SHORT TERM GOAL #1   Title I with PD specific HEP    Time 4    Period Weeks    Status Achieved      OT SHORT TERM GOAL #2   Title  Pt will verbalize understanding of adapted strategies to maximize safety and I with ADLs/ IADLs .    Time 4    Period Weeks    Status On-going      OT SHORT TERM GOAL #3   Title Pt will  demonstrate ability to retrieve a lightweight object form over head sheldf with -10 elbow extension and pain no greater than 3/10.    Time 4    Period Weeks    Status Achieved   met pain in shoulder 1/10     OT SHORT TERM GOAL #4   Title Pt will demonstrate improved bilateral UE functional use as evidenced by increasing bilateral box/ blocks score by 3 blocks    Baseline RUE 47, LUE 45    Time 4    Period Weeks    Status Achieved   RUE 56 LUE 53              OT Long Term  Goals - 06/20/21 0938       OT LONG TERM GOAL #1   Title Pt will verbalize understanding of ways to prevent future PD related complications and PD community resources.    Time 8    Period Weeks    Status Achieved      OT LONG TERM GOAL #2   Title Pt will demonstrate improved ease with dressing as eveidenced by decreasing 3 button/ unbutton time to 32 secs or less    Time 8    Period Weeks    Status On-going   38.06     OT LONG TERM GOAL #3   Title Pt will demonstrate understanding of memory compensations and ways to keep thinking skills sharp    Time 8    Period Weeks    Status Achieved      OT LONG TERM GOAL #4   Title Pt will report less difficulty with donning socks and pants and pt will perform in a reasonable amount of time    Time 8    Period Weeks    Status On-going                   Plan - 06/20/21 1202     Clinical Impression Statement For the reporting period of 05/04/21-06/20/21 .  Pt demonstrates good overall progress and he is on target to meet all remaining goals.    OT Occupational Profile and History Problem Focused Assessment - Including review of records relating to presenting problem    Occupational performance deficits (Please refer to evaluation for details):  ADL's;IADL's;Play;Leisure;Social Participation    Body Structure / Function / Physical Skills ADL;Endurance;UE functional use;Balance;Flexibility;Pain;FMC;ROM;Gait;Coordination;GMC;Decreased knowledge of precautions;Decreased knowledge of use of DME;IADL;Dexterity;Strength;Mobility    Rehab Potential Good    OT Frequency 2x / week    OT Duration 8 weeks    OT Treatment/Interventions Self-care/ADL training;Ultrasound;Energy conservation;Patient/family education;DME and/or AE instruction;Aquatic Therapy;Paraffin;Gait Training;Passive range of motion;Balance training;Fluidtherapy;Cryotherapy;Electrical Stimulation;Functional Mobility Training;Splinting;Therapeutic activities;Manual Therapy;Therapeutic exercise;Moist Heat;Neuromuscular education;Cognitive remediation/compensation    Plan d/c OT next visit    Consulted and Agree with Plan of Care Patient             Patient will benefit from skilled therapeutic intervention in order to improve the following deficits and impairments:   Body Structure / Function / Physical Skills: ADL, Endurance, UE functional use, Balance, Flexibility, Pain, FMC, ROM, Gait, Coordination, GMC, Decreased knowledge of precautions, Decreased knowledge of use of DME, IADL, Dexterity, Strength, Mobility       Visit Diagnosis: Other symptoms and signs involving the nervous system  Muscle weakness (generalized)  Other lack of coordination  Other abnormalities of gait and mobility    Problem List Patient Active Problem List   Diagnosis Date Noted   Parkinson's disease (HCC) 03/29/2021   History of prostate cancer 12/21/2011    James Collier 06/20/2021, 12:11 PM  [facility] [address] [place], [place], [address] Phone: [phone]   Fax:  [phone]  Name: James Collier MRN: [id number] Date of Birth: 1956/03/17

## 2021-06-22 ENCOUNTER — Encounter: Payer: Self-pay | Admitting: Occupational Therapy

## 2021-06-22 ENCOUNTER — Ambulatory Visit: Payer: Medicare PPO | Admitting: Occupational Therapy

## 2021-06-22 ENCOUNTER — Other Ambulatory Visit: Payer: Self-pay

## 2021-06-22 DIAGNOSIS — M25512 Pain in left shoulder: Secondary | ICD-10-CM | POA: Diagnosis not present

## 2021-06-22 DIAGNOSIS — R29818 Other symptoms and signs involving the nervous system: Secondary | ICD-10-CM | POA: Diagnosis not present

## 2021-06-22 DIAGNOSIS — R2689 Other abnormalities of gait and mobility: Secondary | ICD-10-CM | POA: Diagnosis not present

## 2021-06-22 DIAGNOSIS — M6281 Muscle weakness (generalized): Secondary | ICD-10-CM

## 2021-06-22 DIAGNOSIS — R278 Other lack of coordination: Secondary | ICD-10-CM

## 2021-06-22 DIAGNOSIS — R2681 Unsteadiness on feet: Secondary | ICD-10-CM | POA: Diagnosis not present

## 2021-06-22 NOTE — Therapy (Signed)
[facility] [address] [place], [place], [address] Phone: [phone]   Fax:  [phone]  Occupational Therapy Treatment  Patient Details  Name: James Collier MRN: [id number] Date of Birth: 1956-02-08 Referring Provider (OT): Dr. Arbutus Leas   Encounter Date: 06/22/2021   OT End of Session - 06/22/21 1125     Visit Number 11    Number of Visits 17             Past Medical History:  Diagnosis Date   Allergy    Blood in stool    Cancer (HCC) 2012   prostate    Past Surgical History:  Procedure Laterality Date   CATARACT EXTRACTION Right    HEMORROIDECTOMY  1986   PROSTATE SURGERY  2011   TONSILLECTOMY  1965    There were no vitals filed for this visit.   Subjective Assessment - 06/22/21 1125     Subjective  Denies pain    Patient Stated Goals Improve my daily activities    Currently in Pain? No/denies                    Treatment: ambulating while tossing a ball and performing category generation,  for dual tasking min difficulty v.c Tossing scarves with left and right UE's, min v.c for amplitude.               OT Education - 06/22/21 1147     Education Details PWR! moves in standing, basic 4, discussed importance exercise after d/c and focus on stretching and avoiding overhead pull down with weight and biceps curls due to risk for tightness and injury. Discussed plans for return PD evals in 6 months. Checked progress towards goals and reviewed with pt/ wife.    Person(s) Educated Patient;Spouse    Methods Explanation;Demonstration    Comprehension Verbalized understanding;Returned demonstration              OT Short Term Goals - 06/22/21 0946       OT SHORT TERM GOAL #1   Title I with PD specific HEP    Time 4    Period Weeks    Status Achieved      OT SHORT TERM GOAL #2   Title Pt will verbalize understanding of adapted strategies to maximize safety and I with  ADLs/ IADLs .    Time 4    Period Weeks    Status Achieved      OT SHORT TERM GOAL #3   Title Pt will  demonstrate ability to retrieve a lightweight object form over head sheldf with -10 elbow extension and pain no greater than 3/10.    Time 4    Period Weeks    Status Achieved   met pain in shoulder 1/10     OT SHORT TERM GOAL #4   Title Pt will demonstrate improved bilateral UE functional use as evidenced by increasing bilateral box/ blocks score by 3 blocks    Baseline RUE 47, LUE 45    Time 4    Period Weeks    Status Achieved   RUE 56 LUE 53              OT Long Term Goals - 06/22/21 0948       OT LONG TERM GOAL #1   Title Pt will verbalize understanding of ways to prevent future PD related complications and PD community resources.    Time 8    Period  Weeks    Status Achieved      OT LONG TERM GOAL #2   Title Pt will demonstrate improved ease with dressing as eveidenced by decreasing 3 button/ unbutton time to 32 secs or less    Time 8    Period Weeks    Status Achieved   25.65     OT LONG TERM GOAL #3   Title Pt will demonstrate understanding of memory compensations and ways to keep thinking skills sharp    Time 8    Period Weeks    Status Achieved      OT LONG TERM GOAL #4   Title Pt will report less difficulty with donning socks and pants and pt will perform in a reasonable amount of time    Time 8    Period Weeks    Status Achieved                   Plan - 06/22/21 1002     Clinical Impression Statement Pt demonstrates good overall progress towards goals. He achieved all long and short term goals.    OT Occupational Profile and History Problem Focused Assessment - Including review of records relating to presenting problem    Occupational performance deficits (Please refer to evaluation for details): ADL's;IADL's;Play;Leisure;Social Participation    Body Structure / Function / Physical Skills ADL;Endurance;UE functional  use;Balance;Flexibility;Pain;FMC;ROM;Gait;Coordination;GMC;Decreased knowledge of precautions;Decreased knowledge of use of DME;IADL;Dexterity;Strength;Mobility    Rehab Potential Good    OT Frequency 2x / week    OT Duration 8 weeks    OT Treatment/Interventions Self-care/ADL training;Ultrasound;Energy conservation;Patient/family education;DME and/or AE instruction;Aquatic Therapy;Paraffin;Gait Training;Passive range of motion;Balance training;Fluidtherapy;Cryotherapy;Electrical Stimulation;Functional Mobility Training;Splinting;Therapeutic activities;Manual Therapy;Therapeutic exercise;Moist Heat;Neuromuscular education;Cognitive remediation/compensation    Plan d/c OT    Consulted and Agree with Plan of Care Patient             Patient will benefit from skilled therapeutic intervention in order to improve the following deficits and impairments:   Body Structure / Function / Physical Skills: ADL, Endurance, UE functional use, Balance, Flexibility, Pain, FMC, ROM, Gait, Coordination, GMC, Decreased knowledge of precautions, Decreased knowledge of use of DME, IADL, Dexterity, Strength, Mobility     OCCUPATIONAL THERAPY DISCHARGE SUMMARY    Current functional level related to goals / functional outcomes:Pt achieved all goals   Remaining deficits: Decreased coordination, bradykinesia, rigidity, decreased balance   Education / Equipment: Pt was educated regarding the following : PD specific HEP, ways to prevent future PD related complications, community resources, importance of aerobic exercise.Pt verbalized understanding of all education.  Patient agrees to discharge. Patient goals were met. Patient is being discharged due to meeting the stated rehab goals.. Pt agrees to return PD evals in 6-8 mons.     Visit Diagnosis: Other symptoms and signs involving the nervous system  Muscle weakness (generalized)  Other lack of coordination  Other abnormalities of gait and  mobility  Acute pain of left shoulder    Problem List Patient Active Problem List   Diagnosis Date Noted   Parkinson's disease (HCC) 03/29/2021   History of prostate cancer 12/21/2011    Elyshia Kumagai 06/22/2021, 11:58 AM  [facility] [address] [place], [place], [address] Phone: [phone]   Fax:  [phone]  Name: James Collier MRN: [id number] Date of Birth: [birth year]

## 2021-09-05 NOTE — Progress Notes (Signed)
Assessment/Plan:   1.  Parkinsons Disease  -continue carbidopa/levodopa 25/100 three times per day  -add carbidopa/levodopa 50/200 CR at bed for cramping  -exercise  -We discussed that it used to be thought that levodopa would increase risk of melanoma but now it is believed that Parkinsons itself likely increases risk of melanoma. he is to get regular skin checks.  2.  Dysphagia  -MBE in May, 2022 with evidence of mild oropharyngeal dysphagia.  Regular solid as well as mechanical soft solids with thin liquids were recommended. Subjective:   James Collier was seen today in follow up for Parkinsons disease, diagnosed last visit.  My previous records were reviewed prior to todays visit as well as outside records available to me.  This patient is accompanied in the office by his spouse who supplements the history.  Patient now on levodopa.  Patient reports that he is tolerating the medication and it seems to be helping.  pt denies falls.  Pt denies lightheadedness, near syncope.  No hallucinations.  Mood has been good.  Having trouble sleeping - some due to pain/arthritic issues and some due to breathing issues.  He is having cramping of the feet and toes at bedtime.  Patient did have a modified barium swallow since last visit due to dysphagia.  This is done Apr 07, 2021.  There was evidence of mild oropharyngeal dysphagia due to Parkinson's disease.  Regular solids as well as mechanical soft solids and thin liquids are recommended.  Current prescribed movement disorder medications: Carbidopa/levodopa 25/100, 1 tablet 3 times per day (started last visit)   PREVIOUS MEDICATIONS: none to date  ALLERGIES:   Allergies  Allergen Reactions   Aloe     rash    CURRENT MEDICATIONS:  Outpatient Encounter Medications as of 09/07/2021  Medication Sig   carbidopa-levodopa (SINEMET IR) 25-100 MG tablet Take 1 tablet by mouth 3 (three) times daily. 7am/11am/4pm   ibuprofen (ADVIL,MOTRIN) 200 MG  tablet Take 200 mg by mouth every 6 (six) hours as needed.   Multiple Vitamins-Minerals (PRESERVISION AREDS 2 PO) Take by mouth.   Probiotic Product (PROBIOTIC DAILY PO) Take by mouth.   No facility-administered encounter medications on file as of 09/07/2021.    Objective:   PHYSICAL EXAMINATION:    VITALS:   Vitals:   09/07/21 1046  BP: 131/72  Pulse: 63  SpO2: 95%  Weight: 215 lb 12.8 oz (97.9 kg)  Height: 5\' 10"  (1.778 m)    GEN:  The patient appears stated age and is in NAD. HEENT:  Normocephalic, atraumatic.  The mucous membranes are moist. The superficial temporal arteries are without ropiness or tenderness. CV:  RRR Lungs:  CTAB Neck/HEME:  There are no carotid bruits bilaterally.  Neurological examination:  Orientation: The patient is alert and oriented x3. Cranial nerves: There is good facial symmetry with facial hypomimia. The speech is fluent and clear. Soft palate rises symmetrically and there is no tongue deviation. Hearing is intact to conversational tone. Sensation: Sensation is intact to light touch throughout Motor: Strength is at least antigravity x4.  Movement examination: Tone: There is mild increased tone in the RUE (currently time for medicine) Abnormal movements: none even with distraction Coordination:  There is mild decremation on the right Gait and Station: The patient has no difficulty arising out of a deep-seated chair without the use of the hands. The patient's stride length is good with decreased arm swing on the right.    I have reviewed and  interpreted the following labs independently    Chemistry      Component Value Date/Time   NA 139 03/27/2021 0926   K 4.2 03/27/2021 0926   CL 102 03/27/2021 0926   CO2 30 03/27/2021 0926   BUN 14 03/27/2021 0926   CREATININE 1.09 03/27/2021 0926      Component Value Date/Time   CALCIUM 9.5 03/27/2021 0926   ALKPHOS 52 03/27/2021 0926   AST 14 03/27/2021 0926   ALT 18 03/27/2021 0926    BILITOT 0.9 03/27/2021 0926       Lab Results  Component Value Date   WBC 4.8 03/27/2021   HGB 15.2 03/27/2021   HCT 43.5 03/27/2021   MCV 90.1 03/27/2021   PLT 199.0 03/27/2021    Lab Results  Component Value Date   TSH 1.75 03/27/2021     Total time spent on today's visit was 32 minutes, including both face-to-face time and nonface-to-face time.  Time included that spent on review of records (prior notes available to me/labs/imaging if pertinent), discussing treatment and goals, answering patient's questions and coordinating care.  Cc:  [name], MD

## 2021-09-07 ENCOUNTER — Ambulatory Visit: Payer: Medicare PPO | Admitting: Neurology

## 2021-09-07 ENCOUNTER — Other Ambulatory Visit: Payer: Self-pay

## 2021-09-07 VITALS — BP 131/72 | HR 63 | Ht 70.0 in | Wt 215.8 lb

## 2021-09-07 DIAGNOSIS — G20 Parkinson's disease: Secondary | ICD-10-CM | POA: Diagnosis not present

## 2021-09-07 MED ORDER — CARBIDOPA-LEVODOPA ER 50-200 MG PO TBCR: 1.0000 | EXTENDED_RELEASE_TABLET | Freq: Every day | ORAL | 1 refills | Status: DC

## 2021-09-07 MED ORDER — CARBIDOPA-LEVODOPA 25-100 MG PO TABS: 1.0000 | ORAL_TABLET | Freq: Three times a day (TID) | ORAL | 1 refills | Status: DC

## 2021-09-07 NOTE — Patient Instructions (Signed)
Online Resources for Power over Parkinson's Group October 2022  Local [place] Online Groups  Power over [employer] Group :   Power Over Parkinson's Patient Education Group will be Wednesday, October 12th-*Hybrid meting*- in person at [facility] location and via [facility] at 2:00 pm.   Upcoming Power over [employer] Meetings:  2nd Wednesdays of the month at 2 pm:  October 12th, November 9th Contact Amy Marriott at [email or web address] if interested in participating in this online group Parkinson's Care Partners Group:    3rd Mondays, Contact Misty Paladino Atypical Parkinsonian Patient Group:   4th Wednesdays, Contact Misty Paladino If you are interested in participating in these online groups with Misty, please contact her directly for how to join those meetings.  Her contact information is [email or web address].   [place]:  [email or web address] PD Health at Home continues:  Mindfulness Mondays, Expert Briefing Tuesdays, Wellness Wednesdays, Take Time Thursdays, Fitness Fridays -Listings for June 2022 are on the website Upcoming Webinar:  Expert Briefing:  Let's Talk about Dementia.  Wednesday, November 2nd  at 1 pm. Upcoming Webinar:  Understanding Gene and Cell-Based Therapies in Parkinson's.  Wednesday, October 5th at 1 pm Register for expert briefings (webinars) at [email or web address]  Please check out their website to sign up for emails and see their full online offerings  [place]:  [email or web address]  Upcoming Webinar:   Deep Brain Stimulation:  Is it Right for me or my Loved One?  Thursday, October 20th at 12 noon Check out additional information on their website to see their full online offerings  [place]:  [email or web address] Upcoming Webinar:  Living With and Managing Parkinson's Disease Psychosis.  Tuesday, October 18th at 3 pm.   Live Q & A:  Parkinson's Disease Psychosis.  Friday, October 28th at 3 pm. Care Partner Monthly Meetup.  With Robin Searing Phinney.  First Tuesday of each month, 2 pm Joy Breaks:  First Wednesday of each month, 2-3 pm. There will be art, doodling, making, crafting, listening, laughing, stories, and everything in between. No art experience necessary. No supplies required. Just show up for joy!  Register on their website. Check out additional information to Live Well Today on their website  Parkinson and Movement Disorders (PMD) Alliance:  www.pmdalliance.org [employer] Online:  Online Education Events Sign up for emails, which are sent weekly to give you updates on programming and online offerings  Parkinson's Association of the [place]:  www.[email or web address] Information on online support groups, education events, and online exercises including Yoga, Parkinson's exercises and more-LOTS of information on links to PD resources and online events Virtual Support Group through Parkinson's Association of the [place]; next one is scheduled for Wednesday, October 5th at 2 pm.  (These are typically scheduled for the 1st Wednesday of the month at 2 pm).  Visit website for details.  Additional links for movement activities: Parkinson's DRUMMING Classes/Music Therapy with Doylene Canning:  This is a returning class and it's FREE!  2nd Mondays, continuing October 10th.  Contact *Misty Taylor-Paladino at [email or web address] or Doylene Canning at [phone] or [email or web address]  PWR! Moves Classes at [place] RESUMED!  Wednesdays 10 and 11 am.  Contact Amy Gerrit Friends, PT [email or web address] if interested Here is a link to the PWR!Moves classes on Zoom from [place] - Daily Mon-Sat at 10:00. Via Zoom, FREE and open to all.  There is also a link below via Facebook if you use that  platform. [email or web address] Parkinson's Wellness Recovery (PWR! Moves)  www.[email or web address] Info  on the PWR! Virtual Experience:  You will have access to our expertise through self-assessment, guided plans that start with the PD-specific fundamentals, educational content, tips, Q&A with an expert, and a growing Art therapist of PD-specific pre-recorded and live exercise classes of varying types and intensity - both physical and cognitive! If that is not enough, we offer 1:1 wellness consultations (in-person or virtual) to personalize your PWR! Research scientist (medical).  [place] Fridays:  As part of the PD Health @ Home program, this free video series focuses each week on one aspect of fitness designed to support people living with Parkinson's.  These weekly videos highlight the [place] recent fitness guidelines for people with Parkinson's disease.  HollywoodSale.dk Dance for PD website is offering free, live-stream classes throughout the week, as well as links to [employer] of classes:  [email or web address]/ Dance for Parkinson's Class:  [place].  Free offering for people with Parkinson's and care partners; virtual class.  For more information, contact [phone] or email Ruffin Frederick at [email or web address] Virtual dance and Pilates for Parkinson's classes: Click on the Community Tab> Parkinson's Movement Initiative Tab.  To register for classes and for more information, visit www.[email or web address].co.za and click the "community" tab.  [employer] Parkinson's Cycling Classes  [employer]: 1pm on Fridays-Live classes at [employer] ([employer] at  [email or web address] or [phone]) [employer]: Virtual Classes Mondays and Thursdays Jeanette Caprice classes Tuesday, Wednesday and Thursday (contact Craig at [email or web address]  or ([phone])  [place] Varied levels of classes are offered Mondays, Tuesdays and Thursdays at [employer].  To observe a class or for more information, call [phone] or email [email or web address] Well[employer]: Online Caregiver Education Opportunities:  www.well-springsolutions.org/caregiver-education/caregiver-support-group.  You may also contact Vickki Muff at [email or web address] or [phone].   Powerful Tools for Caregivers:  6-week program beginning Thursday, October 13th.  This six-week educational series designed to provide family caregivers with practical tools to care for themselves while caring for a loved one Unlocking Dementia through [facility], Humor, and Understanding:  5-week series beginning September 7th Well-Spring Navigator:  01-31-2000 program, a free service to help individuals and families through the journey of determining care for older adults.  The "Navigator" is a [employer], Education officer, museum, who will speak with a prospective client and/or loved ones to provide an assessment of the situation and a set of recommendations for a personalized care plan -- all free of charge, and whether [employer] offers the needed service or not. If the need is not a service we provide, we are well-connected with reputable programs in town that we can refer you to.  www.well-springsolutions.org or to speak with the Navigator, call [phone].

## 2021-11-07 ENCOUNTER — Telehealth: Payer: Self-pay | Admitting: Family Medicine

## 2021-11-07 NOTE — Telephone Encounter (Signed)
Spoke to patient to schedule Medicare Annual Wellness Visit (AWV) either virtually or in office. Left  my James Collier number [phone]  Patient declined does not want to do    awvi 10/26/21 per [place] please schedule at anytime with [facility] Nurse Health Advisor 1 or 2   This should be a 45 minute visit.

## 2021-12-08 ENCOUNTER — Telehealth: Payer: Self-pay | Admitting: Physical Therapy

## 2021-12-08 DIAGNOSIS — G20 Parkinson's disease: Secondary | ICD-10-CM

## 2021-12-08 NOTE — Telephone Encounter (Signed)
Hello, James Collier is scheduled for a return PT/OT eval in early February, which was recommended and agreed upon at his previous therapy discharge.  If you agree, could you please send in orders for PT and OT eval and treat?  Thank you.    Mady Haagensen, PT 12/08/21 8:16 AM Phone: [phone] Fax: [phone]

## 2021-12-08 NOTE — Addendum Note (Signed)
Addended by: Alonza Bogus S on: 12/08/2021 01:02 PM   Modules accepted: Orders

## 2021-12-27 ENCOUNTER — Ambulatory Visit: Payer: Medicare PPO | Admitting: Physical Therapy

## 2021-12-27 ENCOUNTER — Encounter: Payer: Medicare PPO | Admitting: Occupational Therapy

## 2021-12-27 DIAGNOSIS — H25012 Cortical age-related cataract, left eye: Secondary | ICD-10-CM | POA: Diagnosis not present

## 2021-12-27 DIAGNOSIS — H353132 Nonexudative age-related macular degeneration, bilateral, intermediate dry stage: Secondary | ICD-10-CM | POA: Diagnosis not present

## 2021-12-27 DIAGNOSIS — H524 Presbyopia: Secondary | ICD-10-CM | POA: Diagnosis not present

## 2021-12-27 DIAGNOSIS — H2512 Age-related nuclear cataract, left eye: Secondary | ICD-10-CM | POA: Diagnosis not present

## 2022-01-03 ENCOUNTER — Other Ambulatory Visit: Payer: Self-pay

## 2022-01-03 ENCOUNTER — Encounter: Payer: Self-pay | Admitting: Physical Therapy

## 2022-01-03 ENCOUNTER — Ambulatory Visit: Payer: Medicare PPO | Admitting: Physical Therapy

## 2022-01-03 ENCOUNTER — Encounter: Payer: Self-pay | Admitting: Occupational Therapy

## 2022-01-03 ENCOUNTER — Ambulatory Visit: Payer: Medicare PPO | Attending: Neurology | Admitting: Occupational Therapy

## 2022-01-03 DIAGNOSIS — M6281 Muscle weakness (generalized): Secondary | ICD-10-CM | POA: Diagnosis not present

## 2022-01-03 DIAGNOSIS — R293 Abnormal posture: Secondary | ICD-10-CM | POA: Diagnosis not present

## 2022-01-03 DIAGNOSIS — R278 Other lack of coordination: Secondary | ICD-10-CM | POA: Diagnosis not present

## 2022-01-03 DIAGNOSIS — R2689 Other abnormalities of gait and mobility: Secondary | ICD-10-CM

## 2022-01-03 DIAGNOSIS — R29818 Other symptoms and signs involving the nervous system: Secondary | ICD-10-CM | POA: Diagnosis not present

## 2022-01-03 NOTE — Therapy (Signed)
[facility] [address], [place], [place], [address] Phone: [phone]   Fax:  [phone]  Physical Therapy Evaluation  Patient Details  Name: James Collier MRN: [id number] Date of Birth: 06/19/1956 Referring Provider (PT): Alonza Bogus, DO   Encounter Date: 01/03/2022   PT End of Session - 01/03/22 1700     Visit Number 1    Number of Visits 9    Date for PT Re-Evaluation 03/02/22    Authorization Type [employer] Medicare    PT Start Time 8366    PT Stop Time 2947    PT Time Calculation (min) 43 min    Behavior During Therapy [facility] for tasks assessed/performed             Past Medical History:  Diagnosis Date   Allergy    Blood in stool    Cancer ([place]) 2012   prostate    Past Surgical History:  Procedure Laterality Date   CATARACT EXTRACTION Right    [place]    There were no vitals filed for this visit.    Subjective Assessment - 01/03/22 1238     Subjective Feel like I'm doing pretty well since I finished therapy in the summer.  Feel like I lack direction overall, as in my retirement plans got derailed with Parkinson's disease.  Do the exercises 4-5 times/wk, walking almost everyday.  Uses airdyne bike; still do the yardwork.  Maybe have a little more fatigue than usual.  No falls.    Patient is accompained by: Family member   wife   Currently in Pain? No/denies                [facility] PT Assessment - 01/03/22 1242       Assessment   Medical Diagnosis Parkinson's disease    Referring Provider (PT) Wells Guiles Tat, DO    Onset Date/Surgical Date 12/08/21    Hand Dominance Left    Prior Therapy May-June 2022      Precautions   Precautions Fall    Precaution Comments To have cataract surgery next week      Balance Screen   Has the patient fallen in the past 6 months No    Has the patient had a decrease in activity level because of a fear of  falling?  No    Is the patient reluctant to leave their home because of a fear of falling?  No      Home Environment   Living Environment Private residence    Living Arrangements Spouse/significant other    Available Help at Discharge Family    Type of [place] to enter    Entrance Stairs-Number of Steps 1    [place] One level      Prior Function   Level of Independence Independent    Vocation Retired    [employer] uses push-mower weekly; walks his dogs, enjoys mechanical things; has a farm in [place] that he enjoys camping on the land.  Enjoys hiking and being outside.  Has an Airdyne bike, and uses several days per week.      Observation/Other Assessments   Focus on Therapeutic Outcomes (FOTO)  NA      Posture/Postural Control   Posture/Postural Control Postural limitations    Postural Limitations Forward head;Rounded Shoulders      Tone  Assessment Location Right Lower Extremity;Left Lower Extremity      ROM / Strength   AROM / PROM / Strength AROM;Strength      AROM   Overall AROM  Within functional limits for tasks performed      Strength   Overall Strength Within functional limits for tasks performed      Transfers   Transfers Sit to Stand;Stand to Sit    Sit to Stand 6: Modified independent (Device/Increase time);Without upper extremity assist;From chair/3-in-1    Five time sit to stand comments  15.75    Stand to Sit 6: Modified independent (Device/Increase time);Without upper extremity assist;To chair/3-in-1      Ambulation/Gait   Ambulation/Gait Yes    Ambulation/Gait Assistance 7: Independent    Ambulation Distance (Feet) 60 Feet   x 6   Assistive device None    Gait Pattern Step-through pattern;Poor foot clearance - left;Narrow base of support    Ambulation Surface Level;Indoor    Gait velocity 9.75 sec =3.36 ft/sec      Standardized Balance Assessment   Standardized Balance Assessment Timed Up and Go  Test;Mini-BESTest      Mini-BESTest   Sit To Stand Normal: Comes to stand without use of hands and stabilizes independently.    Rise to Toes Normal: Stable for 3 s with maximum height.    Stand on one leg (left) Normal: 20 s.    Stand on one leg (right) Normal: 20 s.   13.5, 20 sec   Stand on one leg - lowest score 2    Compensatory Stepping Correction - Forward Normal: Recovers independently with a single, large step (second realignement is allowed).    Compensatory Stepping Correction - Backward Normal: Recovers independently with a single, large step    Compensatory Stepping Correction - Left Lateral Normal: Recovers independently with 1 step (crossover or lateral OK)    Compensatory Stepping Correction - Right Lateral Normal: Recovers independently with 1 step (crossover or lateral OK)    Stepping Corredtion Lateral - lowest score 2    Stance - Feet together, eyes open, firm surface  Normal: 30s   increased sway   Stance - Feet together, eyes closed, foam surface  Normal: 30s    Incline - Eyes Closed Normal: Stands independently 30s and aligns with gravity    Change in Gait Speed Normal: Significantly changes walkling speed without imbalance    Walk with head turns - Horizontal Moderate: performs head turns with reduction in gait speed.    Walk with pivot turns Normal: Turns with feet close FAST (< 3 steps) with good balance.    Step over obstacles Normal: Able to step over box with minimal change of gait speed and with good balance.    Timed UP & GO with Dual Task Normal: No noticeable change in sitting, standing or walking while backward counting when compared to TUG without    Mini-BEST total score 27      Timed Up and Go Test   Normal TUG (seconds) 9.82    Manual TUG (seconds) 10.84    Cognitive TUG (seconds) 9.91    TUG Comments Scores > 13.5-15 sec indicates increased fall risk.      RLE Tone   RLE Tone Within Functional Limits      LLE Tone   LLE Tone Mild                         Objective measurements completed on  examination: See above findings.                PT Education - 01/03/22 1659     Education Details PT eval results, POC    Person(s) Educated Patient;Spouse    Methods Explanation    Comprehension Verbalized understanding              PT Short Term Goals - 01/03/22 1716       PT SHORT TERM GOAL #1   Title Pt will be independent with HEP for improved strength, balance, transfers, and gait.  TARGET 02/02/2022    Time 4    Period Weeks    Status New               PT Long Term Goals - 01/03/22 1719       PT LONG TERM GOAL #1   Title Pt will be independent with progression of HEP for improved strength, balance, transfers, and gait.  TARGET 03/02/2022    Time 8    Period Weeks    Status New      PT LONG TERM GOAL #2   Title Pt will improve 5x sit<>stand to less than or equal to 15 sec to demonstrate improved functional strength and transfer efficiency.    Baseline 15.75 sec    Time 8    Period Weeks    Status New      PT LONG TERM GOAL #3   Title Pt will verbalize plans for continued community fitness upon d/c from PT.    Time 8    Period Weeks    Status New                    Plan - 01/03/22 1714     Clinical Impression Statement Pt is a 66 year old male who presents to [facility] with return PT eval recommendation upon d/c from previous bout of PT 04/2021.  He reports continueing to do HEP and walking exercises as well as use of Airdyne bike.  While he does demonstrate good baseline objective measures compared to previous therapy discharge, PT does note increased forward flexed posture/vision reliance on floor with gait and slightly decreased foot clearance with gait.  Pt would benefit from short course of PT to review HEP and address posture, foot clearance with increased amplitude and intensity of movement patterns.    Personal Factors and Comorbidities Comorbidity 2    Comorbidities  prostate cancer, PD dx 03/2021    Examination-Activity Limitations Locomotion Level    Examination-Participation Restrictions Community Activity;Yard Work    Stability/Clinical Decision Making Stable/Uncomplicated    Clinical Decision Making Low    Rehab Potential Good    PT Frequency 1x / week    PT Duration 8 weeks    PT Treatment/Interventions Gait training;Patient/family education;Functional mobility training;Therapeutic activities;Therapeutic exercise;Balance training;Neuromuscular re-education;Passive range of motion;Manual techniques    PT Next Visit Plan Review pt's current HEP and address intensity, amplitude; focus on posture, gait with foot clearance    Consulted and Agree with Plan of Care Patient;Family member/caregiver    Family Member Consulted wife             Patient will benefit from skilled therapeutic intervention in order to improve the following deficits and impairments:  Abnormal gait, Postural dysfunction  Visit Diagnosis: Other abnormalities of gait and mobility  Abnormal posture     Problem List Patient Active Problem List   Diagnosis Date Noted   Parkinson's disease (  [place]) 03/29/2021   History of prostate cancer 12/21/2011    Frazier Butt., PT 01/03/2022, 5:22 PM  [facility] [address], [place], [place], [address] Phone: [phone]   Fax:  [phone]  Name: James Collier MRN: [id number] Date of Birth: 07-09-1956

## 2022-01-03 NOTE — Therapy (Signed)
[facility] [address] [place], [place], [address] Phone: [phone]   Fax:  [phone]  Occupational Therapy Evaluation  Patient Details  Name: James Collier MRN: [id number] Date of Birth: [birth year] Referring Provider (OT): Tat, Eustace Quail, DO   Encounter Date: 01/03/2022   OT End of Session - 01/03/22 1508     Visit Number 1    Number of Visits 1    Authorization Type [employer] Medicare    OT Start Time 1318    OT Stop Time 1400    OT Time Calculation (min) 42 min    Activity Tolerance Patient tolerated treatment well    Behavior During Therapy [facility] for tasks assessed/performed             Past Medical History:  Diagnosis Date   Allergy    Blood in stool    Cancer ([place]) 2012   prostate    Past Surgical History:  Procedure Laterality Date   CATARACT EXTRACTION Right    [place]    There were no vitals filed for this visit.   Subjective Assessment - 01/03/22 1324     Subjective  Pt reports feeling like he is on a plateau with some days are better than others.    Patient is accompanied by: Family member   wife   Currently in Pain? No/denies               [facility] OT Assessment - 01/03/22 1326       Assessment   Medical Diagnosis Parkinson's disease    Referring Provider (OT) Tat, Eustace Quail, DO    Onset Date/Surgical Date 12/08/21   referral order   Hand Dominance Left    Prior Therapy May-June 2022      Precautions   Precautions Fall    Precaution Comments To have cataract surgery next week      Balance Screen   Has the patient fallen in the past 6 months No    Has the patient had a decrease in activity level because of a fear of falling?  No    Is the patient reluctant to leave their home because of a fear of falling?  No      Home  Environment   Family/patient expects to be discharged to: Private residence    Living Arrangements  Spouse/significant other    Available Help at Discharge Family    Lives With [place]ne Retired    [employer] uses push-mower weekly; walks his dogs, enjoys mechanical things; has a farm in [place] that he enjoys camping on the land.  Enjoys hiking and being outside.  Has an Airdyne bike, and uses several days per week.      ADL   Eating/Feeding Modified independent    Grooming Modified independent    Upper Body Bathing Modified independent   difficulty washing hair - uses scalp brush with significant improvements   Upper Body Dressing Increased time;Needs assist for fasteners   mild difficulty with buttons   Lower Body Dressing Modified independent   difficulty with socks/shoes   Toilet Transfer Modified independent    Tub/Shower Transfer Modified independent   grab bar, walk in shower     IADL   Shopping Shops independently for small purchases    Light  Housekeeping Performs light daily tasks such as dishwashing, bed making    Meal Prep Able to complete simple warm meal prep    Medication Management Is responsible for taking medication in correct dosages at correct time   has alarm   Financial Management --   wife handles     Mobility   Mobility Status Independent      Written Expression   Dominant Hand Left    Handwriting 100% legible      Vision - History   Baseline Vision Wears glasses only for reading      Observation/Other Assessments   Physical Performance Test   Yes    Simulated Eating Time (seconds) 16.87    Donning Doffing Jacket Time (seconds) 11.46    Donning Doffing Jacket Comments 3 button/unbutton: 24.56      Posture/Postural Control   Posture/Postural Control Postural limitations    Postural Limitations Forward head      Sensation   Light Touch Impaired by gross assessment   fingertips     Tone   Assessment Location Left Upper Extremity      AROM   Overall AROM  Within  functional limits for tasks performed      Strength   Overall Strength Within functional limits for tasks performed      LUE Tone   LUE Tone Mild                      OT Treatments/Exercises (OP) - 01/03/22 1637       ADLs   UB Dressing Educated on use of "cape" technique with donning jacket with large amplitude movements.  Therapist educated and demonstrated cape technique.  Pt able to verbalize understanding and demonstrated cape technique with donning.  Pt continues to utilizing "shimmy" type strategy with doffing but with little to no difficulty doffing jacket.                    OT Education - 01/03/22 1619     Education Details educated on typical PD screen process and continued use of PD exercises as administered during previous therapy sessions    Person(s) Educated Patient;Spouse    Methods Explanation    Comprehension Verbalized understanding                  Plan - 01/03/22 1631     Clinical Impression Statement Pt is a 66 y/o male who presents to OP OT due to deficits secondary to Parkinson's Disease.  Pt currently presents with minimal impairments secondary to PD, pt reports difficulty donning socks due to decreased flexibility at hips and body habitus.  Pt Anton with dressing tasks and feeding tasks 2-3 seconds different from d/c from previous OT services.  Pt reports feeling that he "has a handle" on things right now and does not feel that he needs therapy services at this time.  Pt will not require follow up OT services at this time due to current use of HEP and minimal change in objective measures impacting ADLs and IADLs.  Pt aware of PD screen process and encouraged to contact MD if any changes arise.    OT Occupational Profile and History Problem Focused Assessment - Including review of records relating to presenting problem    Occupational performance deficits (Please refer to evaluation for details): ADL's;IADL's    Body Structure /  Function / Physical Skills ADL;Endurance;UE functional use;Balance;Flexibility;Pain;FMC;ROM;Gait;Coordination;GMC;Decreased knowledge of precautions;Decreased knowledge of use of DME;IADL;Dexterity;Strength;Mobility  Cognitive Skills Memory    Rehab Potential Good    Clinical Decision Making Limited treatment options, no task modification necessary    Comorbidities Affecting Occupational Performance: May have comorbidities impacting occupational performance    Modification or Assistance to Complete Evaluation  No modification of tasks or assist necessary to complete eval    OT Treatment/Interventions Self-care/ADL training;Energy conservation;Patient/family education;DME and/or AE instruction;Passive range of motion;Balance training;Functional Mobility Training;Therapeutic activities;Cognitive remediation/compensation;Neuromuscular education    Plan discharge from [place] with Plan of Care Patient             Patient will benefit from skilled therapeutic intervention in order to improve the following deficits and impairments:   Body Structure / Function / Physical Skills: ADL, Endurance, UE functional use, Balance, Flexibility, Pain, FMC, ROM, Gait, Coordination, GMC, Decreased knowledge of precautions, Decreased knowledge of use of DME, IADL, Dexterity, Strength, Mobility Cognitive Skills: Memory     Visit Diagnosis: Muscle weakness (generalized)  Other lack of coordination  Other symptoms and signs involving the nervous system    Problem List Patient Active Problem List   Diagnosis Date Noted   Parkinson's disease ([place]) 03/29/2021   History of prostate cancer 12/21/2011    Simonne Come, OT 01/03/2022, 4:39 PM  [facility] [address], [place], [place], [address] Phone: [phone]   Fax:  [phone]  Name: James Collier MRN: [id number] Date of Birth: 29-Apr-1956

## 2022-01-11 DIAGNOSIS — H2512 Age-related nuclear cataract, left eye: Secondary | ICD-10-CM | POA: Diagnosis not present

## 2022-01-11 DIAGNOSIS — H25012 Cortical age-related cataract, left eye: Secondary | ICD-10-CM | POA: Diagnosis not present

## 2022-01-11 DIAGNOSIS — H25812 Combined forms of age-related cataract, left eye: Secondary | ICD-10-CM | POA: Diagnosis not present

## 2022-01-24 ENCOUNTER — Other Ambulatory Visit: Payer: Self-pay

## 2022-01-24 ENCOUNTER — Ambulatory Visit: Payer: Medicare PPO | Attending: Neurology | Admitting: Physical Therapy

## 2022-01-24 ENCOUNTER — Encounter: Payer: Self-pay | Admitting: Physical Therapy

## 2022-01-24 DIAGNOSIS — R293 Abnormal posture: Secondary | ICD-10-CM | POA: Insufficient documentation

## 2022-01-24 DIAGNOSIS — R2689 Other abnormalities of gait and mobility: Secondary | ICD-10-CM | POA: Diagnosis not present

## 2022-01-24 NOTE — Therapy (Signed)
[facility] ?[place], [address] ?[place], [place], [address] ?Phone: [phone]   Fax:  [phone] ? ?Physical Therapy Treatment ? ?Patient Details  ?Name: James Collier ?MRN: [id number] ?Date of Birth: 04/22/1956 ?Referring Provider (PT): Alonza Bogus, DO ? ? ?Encounter Date: 01/24/2022 ? ? PT End of Session - 01/24/22 1117   ? ? Visit Number 2   ? Number of Visits 9   ? Date for PT Re-Evaluation 03/02/22   ? Authorization Type [employer] Medicare   ? PT Start Time 1020   ? PT Stop Time 1100   ? PT Time Calculation (min) 40 min   ? Activity Tolerance Patient tolerated treatment well   ? Behavior During Therapy [facility] for tasks assessed/performed   ? ?  ?  ? ?  ? ? ?Past Medical History:  ?Diagnosis Date  ? Allergy   ? Blood in stool   ? Cancer [facility]) 2012  ? prostate  ? ? ?Past Surgical History:  ?Procedure Laterality Date  ? CATARACT EXTRACTION Right   ? HEMORROIDECTOMY  1986  ? PROSTATE SURGERY  2011  ? TONSILLECTOMY  1965  ? ? ?There were no vitals filed for this visit. ? ? Subjective Assessment - 01/24/22 1022   ? ? Subjective Denies any falls or changes but has had some issues with allergies last week.  Feeling better now and did covid test which was negative.  Worked in yard a lot yesterday moving rocks.  Alittle sore from that.   ? Patient is accompained by: Family member   wife  ? Currently in Pain? No/denies   ? ?  ?  ? ?  ? ? ? ? [facility] PT Treatment/Exercise - 01/24/22 0001   ? ?  ? Transfers  ? Transfers Sit to Stand;Stand to Sit   ? Sit to Stand 6: Modified independent (Device/Increase time);Without upper extremity assist;From chair/3-in-1   ? Stand to Sit 6: Modified independent (Device/Increase time)   ?  ? Ambulation/Gait  ? Ambulation/Gait Yes   ? Ambulation/Gait Assistance 5: Supervision   ? Ambulation/Gait Assistance Details cues for intensity, heel strike,posture and arm swing   ? Ambulation Distance (Feet) 1000 Feet   plus treadmill and between activities   ? Assistive device None   ? Gait Pattern Step-through pattern;Poor foot clearance - left;Narrow base of support   ? Ambulation Surface Level;Indoor   ? Gait Comments Performed gait on treadmill prior to gait on level ground.  Speed initially 1.2 increasing to 1.8 with cues to increase step length.  Pt with decreased heel strike and scoots feet at times.  Can perform with intermittent UE support.  Treadmill gait x 5 minutes.   ?  ? Posture/Postural Control  ? Posture/Postural Control Postural limitations   ? Postural Limitations Forward head   ?  ? Neuro Re-ed   ? Neuro Re-ed Details  PWR Moves in standing with mirror for visual feedback.  Performed PWR! Up, Rock and Twist.  Pt with decreased RUE ROM.  Performed 20 reps each move.  Cues to open hands wide and equal sided movements with all moves.   ?  ? Exercises  ? Exercises Knee/Hip   ? ?  ?  ? ?  ? ? ? ? ? ? ? ? ? ? PT Education - 01/24/22 1120   ? ? Education Details Importance of PD specific HEP along with walking program, [place] PWR moves class   ? Person(s) Educated  Patient   ? Methods Explanation;Demonstration;Verbal cues;Handout   ? Comprehension Verbalized understanding;Need further instruction   ? ?  ?  ? ?  ? ? ? PT Short Term Goals - 01/03/22 1716   ? ?  ? PT SHORT TERM GOAL #1  ? Title Pt will be independent with HEP for improved strength, balance, transfers, and gait.  TARGET 02/02/2022   ? Time 4   ? Period Weeks   ? Status New   ? ?  ?  ? ?  ? ? ? ? PT Long Term Goals - 01/03/22 1719   ? ?  ? PT LONG TERM GOAL #1  ? Title Pt will be independent with progression of HEP for improved strength, balance, transfers, and gait.  TARGET 03/02/2022   ? Time 8   ? Period Weeks   ? Status New   ?  ? PT LONG TERM GOAL #2  ? Title Pt will improve 5x sit<>stand to less than or equal to 15 sec to demonstrate improved functional strength and transfer efficiency.   ? Baseline 15.75 sec   ? Time 8   ? Period Weeks   ? Status New   ?  ? PT LONG TERM GOAL #3  ? Title Pt  will verbalize plans for continued community fitness upon d/c from PT.   ? Time 8   ? Period Weeks   ? Status New   ? ?  ?  ? ?  ? ? ? ? ? ? ? ? Plan - 01/24/22 1117   ? ? Clinical Impression Statement Pt needs constant cues to increase intensity with most aspects of mobility.  Session focused on gait with intensity and PWR moves with intensity in standing.  Pt reports doing lots of "activity" at home/yard but not as consistent with PD specific activities/execise.  Cont per poc.   ? Personal Factors and Comorbidities Comorbidity 2   ? Comorbidities prostate cancer, PD dx 03/2021   ? Examination-Activity Limitations Locomotion Level   ? Examination-Participation Restrictions Community Activity;[place] [employer] Work   ? Stability/Clinical Decision Making Stable/Uncomplicated   ? Rehab Potential Good   ? PT Frequency 1x / week   ? PT Duration 8 weeks   ? PT Treatment/Interventions Gait training;Patient/family education;Functional mobility training;Therapeutic activities;Therapeutic exercise;Balance training;Neuromuscular re-education;Passive range of motion;Manual techniques   ? PT Next Visit Plan Cont to address intensity with all activities, amplitude; focus on posture, gait with foot clearance.  Need to figure out what HEP he would do consistently.   ? Consulted and Agree with Plan of Care Patient   ? ?  ?  ? ?  ? ? ?Patient will benefit from skilled therapeutic intervention in order to improve the following deficits and impairments:  Abnormal gait, Postural dysfunction ? ?Visit Diagnosis: ?Other abnormalities of gait and mobility ? ?Abnormal posture ? ? ? ? ?Problem List ?Patient Active Problem List  ? Diagnosis Date Noted  ? Parkinson's disease ([place]) 03/29/2021  ? History of prostate cancer 12/21/2011  ? ?Narda Bonds, PTA ?[place] ?01/24/22 11:22 AM ?Phone: [phone] ?Fax: [phone] ? ? ? ?[facility] ?[place], [address] ?[place], [place], [address] ?Phone: [phone]   Fax:  [phone] ? ?Name: SHYLOH KRINKE ?MRN: [id number] ?Date of Birth: 1956/09/30 ? ? ? ?

## 2022-02-01 ENCOUNTER — Encounter: Payer: Self-pay | Admitting: Physical Therapy

## 2022-02-01 ENCOUNTER — Other Ambulatory Visit: Payer: Self-pay

## 2022-02-01 ENCOUNTER — Ambulatory Visit: Payer: Medicare PPO | Admitting: Physical Therapy

## 2022-02-01 DIAGNOSIS — R293 Abnormal posture: Secondary | ICD-10-CM | POA: Diagnosis not present

## 2022-02-01 DIAGNOSIS — R2689 Other abnormalities of gait and mobility: Secondary | ICD-10-CM

## 2022-02-01 NOTE — Patient Instructions (Signed)
Optimal Fitness Program after Therapy for People with Parkinson's Disease ? ?1)  Therapy Home Exercise Program ? -Do these Exercises DAILY as instructed by your therapist ? -Big, deliberate effort with exercises ? -These exercises are important to perform consistently, even when therapist has  finished, because these therapy exercises often address your specific  Parkinson's difficulties ? ? ?2)  Walking ? -  Work up to walking 3-5 times per week, 20-30 minutes per day ? -This can be done at home, driveway, quiet street or an indoor track ? -Focus should be on your Best posture, arm swing, step length for your best  walking pattern ? ?3)  Aerobic Exercise ? -Work up to 3-5 times per week, 30 minutes per day ? -This can be stationary bike, seated stepper machine, elliptical machine ? -Work up to 7-8/10 intensity during the exercise, at minimal to moderate    ? Resistance ?  ?  ? ?

## 2022-02-01 NOTE — Therapy (Signed)
[place] ?[facility] ?[address], [address] ?[place], [place], [address] ?Phone: [phone]   Fax:  [phone] ? ?Physical Therapy Treatment ? ?Patient Details  ?Name: James Collier ?MRN: [id number] ?Date of Birth: 1956/01/26 ?Referring Provider (PT): Kerin Salen, DO ? ? ?Encounter Date: 02/01/2022 ? ? PT End of Session - 02/01/22 1208   ? ? Visit Number 3   ? Number of Visits 9   ? Date for PT Re-Evaluation 03/02/22   ? Authorization Type [employer]   ? PT Start Time 1018   ? PT Stop Time 1100   ? PT Time Calculation (min) 42 min   ? Activity Tolerance Patient tolerated treatment well   ? Behavior During Therapy [facility] for tasks assessed/performed   ? ?  ?  ? ?  ? ? ?Past Medical History:  ?Diagnosis Date  ? Allergy   ? Blood in stool   ? Cancer [facility]) 2012  ? prostate  ? ? ?Past Surgical History:  ?Procedure Laterality Date  ? CATARACT EXTRACTION Right   ? HEMORROIDECTOMY  1986  ? PROSTATE SURGERY  2011  ? TONSILLECTOMY  1965  ? ? ?There were no vitals filed for this visit. ? ? Subjective Assessment - 02/01/22 1021   ? ? Subjective Getting over the bout of allergies, and I've been working in the yard, but haven't been doing the exercises as much as I should.   ? Patient is accompained by: Family member   wife  ? Currently in Pain? Yes   general pain (baseline) 2/10  ? Pain Score 2    ? Pain Location Generalized   ? Pain Descriptors / Indicators Aching   ? Aggravating Factors  always there   ? ?  ?  ? ?  ? ? ? ? ? ? ? ? ? ? ? ? ? ? ? ? ? ? ? ? [facility] Adult PT Treatment/Exercise - 02/01/22 0001   ? ?  ? Self-Care  ? Self-Care Other Self-Care Comments   ? Other Self-Care Comments  Discussed current exercise program:  pt states walking in neighborhood with wife, some stretches and occasional PWR! Moves performance.  He has [employer], but hasn't used in several weeks.  Discussed optimal PD fitness routine, including DAILY performance of PWR! Moves from PT HEP.  Pt asks about Tai Chi and he  may look into that with his wife.   ? ?  ?  ? ?  ? ? ?Pt performs PWR! Moves in seated position x 10 reps ?  ?PWR! Up for improved posture ? ?PWR! Rock for improved weighshifting ? ?PWR! Twist for improved trunk rotation  ? ?PWR! Step for improved step initiation-obstacles placed for single step out and in, prolonged hold for 3 reps for added hip stretch ? ?Cues provided for technique, intensity ? ? ?Pt performs PWR! Moves in stand position x 10 reps ?  ?PWR! Up for improved posture, 2nd set with [employer]ers for added squat and reach tasks ? ?PWR! Rock for improved weighshifting ? ?PWR! Twist for improved trunk rotation  ? ?PWR! Step for improved step initiation  ? ?Cues provided for technique, intensity.  Pt rates intensity/effort level as 4/10.  Discussed increased amplitude for exercises and consistency of HEP performance. ? ? ? Balance Exercises - 02/01/22 0001   ? ?  ? Balance Exercises: Standing  ? Sidestepping 2 reps;Limitations   ? Sidestepping Limitations 20 ft R and L with coordinated UE/lower extremity open/close   ?  Other Standing Exercises Forward step and stop/pause step with reciprocal arm swing, 20 ft x 4 reps.  Good initial coordinaton of arm swing, he is able to self-correct when off sequence.   ? Other Standing Exercises Comments Marching in place on compliant incline surface, 2 x 10 reps with cues for slowed pace/increased foot clearance and step height.  Standing on incline wtih forward step taps and return to midline, then with coordinated recprocal arm lifts (pt has difficulty with coordination of this)   ? ?  ?  ? ?  ? ? ? ? ? PT Education - 02/01/22 1207   ? ? Education Details PD optimal fitness program   ? Person(s) Educated Patient   ? Methods Explanation;Handout   ? Comprehension Verbalized understanding   ? ?  ?  ? ?  ? ? ? PT Short Term Goals - 02/01/22 1213   ? ?  ? PT SHORT TERM GOAL #1  ? Title Pt will be independent with HEP for improved strength, balance, transfers, and gait.   TARGET 02/02/2022   ? Time 4   ? Period Weeks   ? Status On-going   ? ?  ?  ? ?  ? ? ? ? PT Long Term Goals - 01/03/22 1719   ? ?  ? PT LONG TERM GOAL #1  ? Title Pt will be independent with progression of HEP for improved strength, balance, transfers, and gait.  TARGET 03/02/2022   ? Time 8   ? Period Weeks   ? Status New   ?  ? PT LONG TERM GOAL #2  ? Title Pt will improve 5x sit<>stand to less than or equal to 15 sec to demonstrate improved functional strength and transfer efficiency.   ? Baseline 15.75 sec   ? Time 8   ? Period Weeks   ? Status New   ?  ? PT LONG TERM GOAL #3  ? Title Pt will verbalize plans for continued community fitness upon d/c from PT.   ? Time 8   ? Period Weeks   ? Status New   ? ?  ?  ? ?  ? ? ? ? ? ? ? ? Plan - 02/01/22 1208   ? ? Clinical Impression Statement Continued to provide cues for intensity and technique with PWR! Moves exercises and with other mobility activities in PT session.  Continued to reinforce improtance of PD-specific exercise, aerobic activities, and walking program for home.  Pt will continue to benefit from skilled PT to further address increased amplitude of movement for improved functional strength, balance, and overall improved functional mobility.  STG not yet met/ongoing, as pt is not consistently performing HEP; education provided this session.   ? Personal Factors and Comorbidities Comorbidity 2   ? Comorbidities prostate cancer, PD dx 03/2021   ? Examination-Activity Limitations Locomotion Level   ? Examination-Participation Restrictions Community Activity;Pincus Badder Work   ? Stability/Clinical Decision Making Stable/Uncomplicated   ? Rehab Potential Good   ? PT Frequency 1x / week   ? PT Duration 8 weeks   ? PT Treatment/Interventions Gait training;Patient/family education;Functional mobility training;Therapeutic activities;Therapeutic exercise;Balance training;Neuromuscular re-education;Passive range of motion;Manual techniques   ? PT Next Visit Plan Next visit is  last scheduled.  Discuss continueing vs d/c.  Cont to address intensity with all activities, amplitude; focus on posture, gait with foot clearance.  Need to figure out what HEP he would do consistently.   ? Consulted and Agree with Plan of  Care Patient   ? ?  ?  ? ?  ? ? ?Patient will benefit from skilled therapeutic intervention in order to improve the following deficits and impairments:  Abnormal gait, Postural dysfunction ? ?Visit Diagnosis: ?Abnormal posture ? ?Other abnormalities of gait and mobility ? ? ? ? ?Problem List ?Patient Active Problem List  ? Diagnosis Date Noted  ? Parkinson's disease (HCC) 03/29/2021  ? History of prostate cancer 12/21/2011  ? ? ?Izabell Schalk W., PT ?02/01/2022, 12:14 PM ? ?[place] ?[facility] ?[address], [address] ?[place], [place], [address] ?Phone: [phone]   Fax:  [phone] ? ?Name: James Collier ?MRN: [id number] ?Date of Birth: 02-Feb-1956 ? ? ? ?

## 2022-02-08 ENCOUNTER — Other Ambulatory Visit: Payer: Self-pay

## 2022-02-08 ENCOUNTER — Ambulatory Visit: Payer: Medicare PPO | Admitting: Physical Therapy

## 2022-02-08 DIAGNOSIS — R2689 Other abnormalities of gait and mobility: Secondary | ICD-10-CM

## 2022-02-08 DIAGNOSIS — R293 Abnormal posture: Secondary | ICD-10-CM | POA: Diagnosis not present

## 2022-02-08 NOTE — Patient Instructions (Addendum)
? ?  PWR! Moves FLOW, x 5-10 reps, 2-3 times per week ? ?

## 2022-02-09 NOTE — Therapy (Signed)
[place] ?[facility] ?[address], [address] ?[place], [place], [address] ?Phone: [phone]   Fax:  [phone] ? ?Physical Therapy Treatment/Discharge Summary ? ?Patient Details  ?Name: James Collier ?MRN: [id number] ?Date of Birth: [birth year] ?Referring Provider (PT): Lurena Joiner Tat, DO ? ? ? ?PHYSICAL THERAPY DISCHARGE SUMMARY ? ?Visits from Start of Care: 4 ? ?Current functional level related to goals / functional outcomes: ? PT Long Term Goals - 02/09/22 0906   ? ?  ? PT LONG TERM GOAL #1  ? Title Pt will be independent with progression of HEP for improved strength, balance, transfers, and gait.  TARGET 03/02/2022   ? Time 8   ? Period Weeks   ? Status Achieved   ?  ? PT LONG TERM GOAL #2  ? Title Pt will improve 5x sit<>stand to less than or equal to 15 sec to demonstrate improved functional strength and transfer efficiency.   ? Baseline 15.75 sec; 02/08/2022 13.94 sec   ? Time 8   ? Period Weeks   ? Status Achieved   ?  ? PT LONG TERM GOAL #3  ? Title Pt will verbalize plans for continued community fitness upon d/c from PT.   ? Time 8   ? Period Weeks   ? Status Achieved   ? ?  ?  ? ?  ? ?Pt has met all LTGs and pt has met STG. ?  ?Remaining deficits: ?Posture, functional strength-improving ?  ?Education / Equipment: ?Educated in [employer], community fitness and optimal fitness program options.  ? ?Patient agrees to discharge. Patient goals were met. Patient is being discharged due to being pleased with the current functional level.  Recommend PT (OT, and speech) screens in 6-9 months due to progressive nature of disease. ? Lonia Blood, PT ?02/09/22 9:12 AM ?Phone: [phone] ?Fax: [phone] ? ? ?Encounter Date: 02/08/2022 ? ? PT End of Session - 02/08/22 1016   ? ? Visit Number 4   ? Number of Visits 9   ? Date for PT Re-Evaluation 03/02/22   ? Authorization Type [employer]   ? PT Start Time 1015   ? PT Stop Time 1100   ? PT Time Calculation (min) 45 min   ? Activity Tolerance  Patient tolerated treatment well   ? Behavior During Therapy [facility] for tasks assessed/performed   ? ?  ?  ? ?  ? ? ?Past Medical History:  ?Diagnosis Date  ? Allergy   ? Blood in stool   ? Cancer [facility]) 2012  ? prostate  ? ? ?Past Surgical History:  ?Procedure Laterality Date  ? CATARACT EXTRACTION Right   ? HEMORROIDECTOMY  1986  ? PROSTATE SURGERY  2011  ? TONSILLECTOMY  1965  ? ? ?There were no vitals filed for this visit. ? ? Subjective Assessment - 02/08/22 1013   ? ? Subjective No changes, nothing new.Think I'm ready to wrap up today.  I know I need to keep working with exercise.   ? Patient is accompained by: Family member   wife  ? Currently in Pain? No/denies   ? ?  ?  ? ?  ? ? ? ? ? ? ? ? ? ? ? ? ? ? ? ? ? ? ? ? [facility] Adult PT Treatment/Exercise - 02/08/22 1020   ? ?  ? Transfers  ? Transfers Sit to Stand;Stand to Sit   ? Sit to Stand 6: Modified independent (Device/Increase time);Without upper extremity assist;From chair/3-in-1   ?  Five time sit to stand comments  13.94   ? Stand to Sit 6: Modified independent (Device/Increase time)   ? Number of Reps 2 sets;10 reps   ? Comments 1 set from mat surface, standing on solid ground; 2nd set standing on Airex   ?  ? Ambulation/Gait  ? Ambulation/Gait Yes   ? Ambulation/Gait Assistance 7: Independent   ? Ambulation/Gait Assistance Details Outdoor gait x >1000 ft, no device, good arm swing, good foot clearance with conversation tasks on unlevel and varied surfaces   ? Assistive device None   ? Gait Pattern Step-through pattern   ? Ambulation Surface Level;Indoor   ?  ? Self-Care  ? Self-Care Other Self-Care Comments   ? Other Self-Care Comments  Discussed progress towards goals, POC, importance of consistent exercise; reviewed community fitness options.   ?  ? Knee/Hip Exercises: Seated  ? Sit to Sand 10 reps;without UE support   from mat surface; 2nd set standing on Airex  ? ?  ?  ? ?  ? ?Neuro Re-education: ? ? ?Pt performs PWR! Moves in stand position x 20 reps ?   ?PWR! Up for improved posture ?  ?PWR! Rock for improved weighshifting ?  ?PWR! Twist for improved trunk rotation  ?  ?PWR! Step for improved step initiation ? ?PWR! Moves FLOW in standing, x 5 reps with cues for technique and amplitude ? ? ? ? ? ? ? PT Education - 02/09/22 0903   ? ? Education Details PWR! Moves FLOW in standing   ? Person(s) Educated Patient   ? Methods Explanation;Demonstration;Handout   ? Comprehension Verbalized understanding;Returned demonstration   ? ?  ?  ? ?  ? ? ? PT Short Term Goals - 02/09/22 0906   ? ?  ? PT SHORT TERM GOAL #1  ? Title Pt will be independent with HEP for improved strength, balance, transfers, and gait.  TARGET 02/02/2022   ? Time 4   ? Period Weeks   ? Status Achieved   ? ?  ?  ? ?  ? ? ? ? PT Long Term Goals - 02/09/22 0906   ? ?  ? PT LONG TERM GOAL #1  ? Title Pt will be independent with progression of HEP for improved strength, balance, transfers, and gait.  TARGET 03/02/2022   ? Time 8   ? Period Weeks   ? Status Achieved   ?  ? PT LONG TERM GOAL #2  ? Title Pt will improve 5x sit<>stand to less than or equal to 15 sec to demonstrate improved functional strength and transfer efficiency.   ? Baseline 15.75 sec; 02/08/2022 13.94 sec   ? Time 8   ? Period Weeks   ? Status Achieved   ?  ? PT LONG TERM GOAL #3  ? Title Pt will verbalize plans for continued community fitness upon d/c from PT.   ? Time 8   ? Period Weeks   ? Status Achieved   ? ?  ?  ? ?  ? ? ? ? ? ? ? ? Plan - 02/09/22 0906   ? ? Clinical Impression Statement Assessed STGs and LTGs this visit, as pt feels he has what he needs to continue exercises at home.  Pt has met all STGs and LTGs.  He demonstrates improved functional strength with 5x sit<>stand and improved gait pattern with improved attention to posture, arm swing and foot clearance.  He has been educated in optimal fitness program and  appropriate community fitness options.  He is appropriate for d/c this visit.   ? Personal Factors and Comorbidities  Comorbidity 2   ? Comorbidities prostate cancer, PD dx 03/2021   ? Examination-Activity Limitations Locomotion Level   ? Examination-Participation Restrictions Community Activity;Pincus Badder Work   ? Stability/Clinical Decision Making Stable/Uncomplicated   ? Rehab Potential Good   ? PT Frequency 1x / week   ? PT Duration 8 weeks   ? PT Treatment/Interventions Gait training;Patient/family education;Functional mobility training;Therapeutic activities;Therapeutic exercise;Balance training;Neuromuscular re-education;Passive range of motion;Manual techniques   ? PT Next Visit Plan DC PT this visit.  REcommend PT (OT, and speech) screens in 6-9 months   ? Consulted and Agree with Plan of Care Patient   ? ?  ?  ? ?  ? ? ?Patient will benefit from skilled therapeutic intervention in order to improve the following deficits and impairments:  Abnormal gait, Postural dysfunction ? ?Visit Diagnosis: ?Other abnormalities of gait and mobility ? ?Abnormal posture ? ? ? ? ?Problem List ?Patient Active Problem List  ? Diagnosis Date Noted  ? Parkinson's disease (HCC) 03/29/2021  ? History of prostate cancer 12/21/2011  ? ? ?Britten Seyfried W., PT ?02/09/2022, 9:10 AM ? ?[place] ?[facility] ?[address], [address] ?[place], [place], [address] ?Phone: [phone]   Fax:  [phone] ? ?Name: CHROSTOPHER TELLMAN ?MRN: [id number] ?Date of Birth: 1956-06-24 ? ? ? ?

## 2022-02-23 NOTE — Progress Notes (Signed)
? ? ?Assessment/Plan:  ? ?1.  Parkinsons Disease ? -continue carbidopa/levodopa 25/100 three times per day ? -continue carbidopa/levodopa 50/200 CR at bed for cramping ? -start pramipexole and work to 0.5 mg tid.  Discussed r/b/se of agonists.  Understanding expressed ? -discussed increasing exercise ? -We discussed that it used to be thought that levodopa would increase risk of melanoma but now it is believed that Parkinsons itself likely increases risk of melanoma. he is to get regular skin checks. ? ?2.  Dysphagia ? -MBE in May, 2022 with evidence of mild oropharyngeal dysphagia.  Regular solid as well as mechanical soft solids with thin liquids were recommended. ?Subjective:  ? ?James Collier was seen today in follow up for Parkinsons disease, diagnosed last visit.  My previous records were reviewed prior to todays visit as well as outside records available to me.  This patient is accompanied in the office by his spouse who supplements the history.  Bedtime levodopa was added last visit.  This was for cramping in the feet and legs at nighttime.  He reports that this helped.  Physical therapy notes are reviewed.  He feels that he is on a "plateau" and nothing is "measurably changed." ? ?Current prescribed movement disorder medications: ?Carbidopa/levodopa 25/100, 1 tablet 3 times per day  ?Carbidopa/levodopa 50/200 CR at bedtime (started for cramping in feet/legs at bedtime last visit) ? ? ?PREVIOUS MEDICATIONS: none to date ? ?ALLERGIES:   ?Allergies  ?Allergen Reactions  ? Aloe   ?  rash  ? ? ?CURRENT MEDICATIONS:  ?Outpatient Encounter Medications as of 02/27/2022  ?Medication Sig  ? carbidopa-levodopa (SINEMET CR) 50-200 MG tablet Take 1 tablet by mouth at bedtime.  ? carbidopa-levodopa (SINEMET IR) 25-100 MG tablet Take 1 tablet by mouth 3 (three) times daily. 7am/11am/4pm  ? ibuprofen (ADVIL,MOTRIN) 200 MG tablet Take 200 mg by mouth every 6 (six) hours as needed.  ? Multiple Vitamins-Minerals (PRESERVISION  AREDS 2 PO) Take by mouth.  ? Probiotic Product (PROBIOTIC DAILY PO) Take by mouth.  ? ?No facility-administered encounter medications on file as of 02/27/2022.  ? ? ?Objective:  ? ?PHYSICAL EXAMINATION:   ? ?VITALS:   ?Vitals:  ? 02/27/22 0833  ?BP: 122/78  ?Pulse: 65  ?SpO2: 97%  ?Weight: 223 lb (101.2 kg)  ?Height: 6' (1.829 m)  ? ? ? ?GEN:  The patient appears stated age and is in NAD. ?HEENT:  Normocephalic, atraumatic.  The mucous membranes are moist. The superficial temporal arteries are without ropiness or tenderness. ? ? ?Neurological examination: ? ?Orientation: The patient is alert and oriented x3. ?Cranial nerves: There is good facial symmetry with facial hypomimia. The speech is fluent and clear. Soft palate rises symmetrically and there is no tongue deviation. Hearing is intact to conversational tone. ?Sensation: Sensation is intact to light touch throughout ?Motor: Strength is at least antigravity x4. ? ?Movement examination: ?Tone: There is mild to mod increased tone in the LUE ?Abnormal movements: none even with distraction ?Coordination:  There is mild decremation on the L ?Gait and Station: The patient has no difficulty arising out of a deep-seated chair without the use of the hands.  ? ?I have reviewed and interpreted the following labs independently ? ?  Chemistry   ?   ?Component Value Date/Time  ? NA 139 03/27/2021 0926  ? K 4.2 03/27/2021 0926  ? CL 102 03/27/2021 0926  ? CO2 30 03/27/2021 0926  ? BUN 14 03/27/2021 0926  ? CREATININE 1.09 03/27/2021 0926  ?    ?  Component Value Date/Time  ? CALCIUM 9.5 03/27/2021 0926  ? ALKPHOS 52 03/27/2021 0926  ? AST 14 03/27/2021 0926  ? ALT 18 03/27/2021 0926  ? BILITOT 0.9 03/27/2021 6116  ?  ? ? ? ?Lab Results  ?Component Value Date  ? WBC 4.8 03/27/2021  ? HGB 15.2 03/27/2021  ? HCT 43.5 03/27/2021  ? MCV 90.1 03/27/2021  ? PLT 199.0 03/27/2021  ? ? ?Lab Results  ?Component Value Date  ? TSH 1.75 03/27/2021  ? ? ? ?Total time spent on today's visit was  32 minutes, including both face-to-face time and nonface-to-face time.  Time included that spent on review of records (prior notes available to me/labs/imaging if pertinent), discussing treatment and goals, answering patient's questions and coordinating care. ? ?Cc:  [name], MD ? ?

## 2022-02-27 ENCOUNTER — Ambulatory Visit: Payer: Medicare PPO | Admitting: Neurology

## 2022-02-27 ENCOUNTER — Encounter: Payer: Self-pay | Admitting: Neurology

## 2022-02-27 VITALS — BP 122/78 | HR 65 | Ht 72.0 in | Wt 223.0 lb

## 2022-02-27 DIAGNOSIS — G20 Parkinson's disease: Secondary | ICD-10-CM | POA: Diagnosis not present

## 2022-02-27 MED ORDER — PRAMIPEXOLE DIHYDROCHLORIDE 0.5 MG PO TABS: 0.5000 mg | ORAL_TABLET | Freq: Three times a day (TID) | ORAL | 1 refills | Status: DC

## 2022-02-27 MED ORDER — CARBIDOPA-LEVODOPA ER 50-200 MG PO TBCR: 1.0000 | EXTENDED_RELEASE_TABLET | Freq: Every day | ORAL | 1 refills | Status: DC

## 2022-02-27 MED ORDER — PRAMIPEXOLE DIHYDROCHLORIDE 0.125 MG PO TABS: ORAL_TABLET | ORAL | 0 refills | Status: DC

## 2022-02-27 NOTE — Patient Instructions (Signed)
Start mirapex (pramipexole) as follows:  0.125 mg - 1 tablet three times per day for a week, then 2 tablets three times per day for a week and then fill the 0.5 mg tablet and take that, 1 pill three times per day (#73) ? ?Local and Online Resources for Power over Parkinson's Group ?March 2023 ? ?LOCAL  PARKINSON'S GROUPS  ?Power over Parkinson's Group :   ?Power Over Parkinson's Patient Education Group will be Wednesday, March 8th-*Hybrid meting*- in person at [place] location and via [facility] at 2:00 pm.   ?Upcoming Power over Parkinson's Meetings:  2nd Wednesdays of the month at 2 pm:  March 8th, April 12th ?Contact Amy Marriott at [email or web address] if interested in participating in this group ?Parkinson's Care Partners Group:    3rd Mondays, Contact Misty Paladino ?Atypical Parkinsonian Patient Group:   4th Wednesdays, Contact Misty Paladino ?If you are interested in participating in these groups with Misty, please contact her directly for how to join those meetings.  Her contact information is [email or web address].   ? ?LOCAL EVENTS AND NEW OFFERINGS ?Parkinson's Wellness Event:  Pottery Night at [facility].  Friday, March 10th 5:30-7:30 pm.  Sponsored by [employer] [place].  FREE event for people with Parkinson's and care partners.  RSVP to [facility] at [email or web address] ?Dine out at [employer].  Celebrate Parkinson's disease Awareness Month and Support the Parkinson's Movement Disorder Fund.   Wednesday, April 19th 4-6 pm at [place], [employer].  (Give receipt to cashier and 20% will be donated) ?Parkinson's T-shirts for sale!  Designed by a local group member, with funds going to [place].  $20.00  Contact Misty to purchase (see email above) ?New PWR! Moves Class offering at [employer]!  Fridays 1-2 pm in March (likely to switch days and times after March).  Come try it out and see if PWR! Moves is a good fit  for your exercise routine!  Contact Amy Marriott for details:  [email or web address] ?Hamil-Kerr Challenge Bike, Run, Walk for PD, PSP, MSA.    Saturday, April 8th at 8 am.  [place]  Proceeds go to offset costs of exercise programs locally.  To Register, visit website:  www.[email or web address] ? ?ONLINE EDUCATION AND SUPPORT ?[place]:  www.parkinson.org ?PD Health at Home continues:  Mindfulness Mondays, Expert Briefing Tuesdays, Wellness Wednesdays, Take Time Thursdays, Fitness Fridays  ?Upcoming Education:  ?Parkinson's and Medications:  What's New.  Wednesday, March 8th at 1:00 pm. ?Freezing and Fall Prevention in Parkinson's.  Wednesday, April 12th at 1:00 pm ?Register for Armed forces operational officer) at WatchCalls.si ?[place] Chapter Parkinson's Symposium: Cognition Changes- a free in-person ([place], [place]) and online Audiological scientist) event for people with Parkinson's and their loved ones. During this event we will explore new treatments and practical strategies for addressing these changes.  Saturday, April 1st, 10 am-1 pm.  Register at [employer].[email or web address]/ or contact [place] at [phone] or [email or web address]. ? Please check out their website to sign up for emails and see their full online offerings ? ?[place]:  www.[email or web address]  ?Third Thursday Webinars:  On the third Thursday of every month at 12 p.m. ET, join our free live webinars to learn about various aspects of living with Parkinson's disease and our work to speed medical breakthroughs. ?Upcoming Webinar:  The Power of Women in Philanthropy.  Tues, March 28th at  12 pm. ?Check out additional information on their website to see their full online offerings ? ?[place]:  www.[email or web address] ?Upcoming Webinar:   Stay tuned ?Webinar Series:  Living with Parkinson's  Meetup.   Third Thursdays of each month at 3 pm ?Care Partner Monthly Meetup.  With Robin Searing Phinney.  First Tuesday of each month, 2 pm ?Check out additional information to Live Well Today on their website ? ?Parkinson and Movement Disorders (PMD) Alliance:  www.pmdalliance.org ?[employer] Online:  Online Education Events ?Sign up for emails, which are sent weekly to give you updates on programming and online offerings ? ?Parkinson's Association of the [place]:  www.parkinsonassociation.org ?Information on online support groups, education events, and online exercises including Yoga, Parkinson's exercises and more-LOTS of information on links to PD resources and online events ?Virtual Support Group through [employer] of the [place]; next one is scheduled for Wednesday, *April 5th at 2 pm. *March meeting has been cancelled. (These are typically scheduled for the 1st Wednesday of the month at 2 pm).  Visit website for details. ?MOVEMENT AND EXERCISE OPPORTUNITIES ?Parkinson's DRUMMING Classes/Music Therapy with Doylene Canning:  This is a returning class and it's FREE!  2nd Mondays, continuing March 13th , 11:00 at the [place].  Contact *Misty Taylor-Paladino at [email or web address] or Doylene Canning at [phone] or [email or web address]  ?PWR! Moves Classes at [place].  Wednesdays 10 and 11 am.   NEW PWR! Moves Class offering at [employer].  Fridays 1-2 pm.  Contact Amy Marriott, PT [email or web address] if interested. ?Here is a link to the PWR!Moves classes on Zoom from [place] - Daily Mon-Sat at 10:00. Via Zoom, FREE and open to all.  There is also a link below via Facebook if you use that  platform. ?[email or web address].si ?https://www.[email or web address].no ? ?Parkinson's Wellness Recovery (PWR! Moves)  www.pwr4life.org ?Info on the PWR! Virtual Experience:  You will have access to our expertise through self-assessment, guided plans that start with the PD-specific fundamentals, educational content, tips, Q&A with an expert, and a growing Art therapist of PD-specific pre-recorded and live exercise classes of varying types and intensity - both physical and cognitive! If that is not enough, we offer 1:1 wellness consultations (in-person or virtual) to personalize your PWR! Research scientist (medical).  ?[employer] Fridays:  ?As part of the PD Health @ Home program, this free video series focuses each week on one aspect of fitness designed to support people living with Parkinson's.  These weekly videos highlight the [place] recent fitness guidelines for people with Parkinson's disease. ?www.KVTVnet.com.cy ?Dance for PD website is offering free, live-stream classes throughout the week, as well as links to [employer] of classes:  https://danceforparkinsons.org/ ?Dance for Parkinson's in-person class.  February 1-April 26, Wednesdays 4-5 pm.  Free class for people with Parkinson's disease, at [address], [place], [place].  Contact [phone] or [email or web address] to register ?Virtual dance and Pilates for Parkinson's classes: Click on the Community Tab> Parkinson's Movement Initiative Tab.  To register for classes and for more information, visit www.[email or web address].co.za and click the ?community? tab.  ?[employer] Parkinson's Cycling Classes  ?[employer]:  Thursdays @ Noon-Live classes at [employer] ([employer] at [email or web address] or  [phone]) ?[facility] [employer]: Virtual Classes Mondays and Thursdays Jeanette Caprice classes Tuesday, Wednesday and Thursday (contact [place] at [email or web address]  or [phone]) ?[employer] ?Varied levels of classes are offered Mondays, Tuesdays and Thursdays at [employer].  ?Stretching with Verdis Frederickson weekly class is also offered for people with Parkin

## 2022-02-28 DIAGNOSIS — Z961 Presence of intraocular lens: Secondary | ICD-10-CM | POA: Diagnosis not present

## 2022-03-08 ENCOUNTER — Ambulatory Visit: Payer: Medicare PPO | Admitting: Neurology

## 2022-03-27 ENCOUNTER — Other Ambulatory Visit: Payer: Self-pay | Admitting: Neurology

## 2022-03-27 DIAGNOSIS — G20 Parkinson's disease: Secondary | ICD-10-CM

## 2022-04-28 ENCOUNTER — Other Ambulatory Visit: Payer: Self-pay

## 2022-04-28 ENCOUNTER — Ambulatory Visit (HOSPITAL_COMMUNITY)
Admission: EM | Admit: 2022-04-28 | Discharge: 2022-04-28 | Disposition: A | Discharge status: Home / Self Care | Payer: Medicare PPO | Attending: Physician Assistant | Admitting: Physician Assistant

## 2022-04-28 ENCOUNTER — Encounter (HOSPITAL_COMMUNITY): Payer: Self-pay | Admitting: Emergency Medicine

## 2022-04-28 ENCOUNTER — Ambulatory Visit (INDEPENDENT_AMBULATORY_CARE_PROVIDER_SITE_OTHER): Payer: Medicare PPO

## 2022-04-28 DIAGNOSIS — J40 Bronchitis, not specified as acute or chronic: Secondary | ICD-10-CM

## 2022-04-28 DIAGNOSIS — R051 Acute cough: Secondary | ICD-10-CM

## 2022-04-28 DIAGNOSIS — R059 Cough, unspecified: Secondary | ICD-10-CM | POA: Diagnosis not present

## 2022-04-28 DIAGNOSIS — J329 Chronic sinusitis, unspecified: Secondary | ICD-10-CM | POA: Diagnosis not present

## 2022-04-28 HISTORY — DX: Parkinson's disease without dyskinesia, without mention of fluctuations: G20.A1

## 2022-04-28 HISTORY — DX: Parkinson's disease: G20

## 2022-04-28 MED ORDER — BENZONATATE 100 MG PO CAPS: 100.0000 mg | ORAL_CAPSULE | Freq: Three times a day (TID) | ORAL | 0 refills | Status: DC

## 2022-04-28 MED ORDER — DOXYCYCLINE HYCLATE 100 MG PO CAPS: 100.0000 mg | ORAL_CAPSULE | Freq: Two times a day (BID) | ORAL | 0 refills | Status: DC

## 2022-04-28 NOTE — ED Provider Notes (Signed)
[place]    CSN: [id number] Arrival date & time: 04/28/22  1012      History   Chief Complaint Chief Complaint  Patient presents with   Cough    HPI James Collier is a 66 y.o. male.   Patient presents today with a week plus long history of cough.  Reports that his son had similar symptoms but he recovered within about 7 to 10 days.  Patient has had ongoing symptoms prompting evaluation.  Reports congestion, sore throat, postnasal drainage, cough, chest tightness.  Denies any chest pain, shortness of breath, fever, nausea, vomiting.  He is having difficulty sleeping lying flat as result of drainage which makes him choke; denies orthopnea.  He has been doing acetaminophen, Mucinex, Coricidin without improvement of symptoms.  Denies any recent antibiotic use.  He denies history of asthma, COPD, smoking.  He does have allergies but is not taking medication for this on a regular basis.  He has a history of Parkinson's and is concerned that neurological condition is contributing to difficulty coughing/clearing infection.   Past Medical History:  Diagnosis Date   Allergy    Blood in stool    Cancer ([place]) 11/26/2010   prostate   Parkinson disease [facility])     Patient Active Problem List   Diagnosis Date Noted   Parkinson's disease ([place]) 03/29/2021   History of prostate cancer 12/21/2011    Past Surgical History:  Procedure Laterality Date   CATARACT EXTRACTION Right    Rock Medications    Prior to Admission medications   Medication Sig Start Date End Date Taking? Authorizing Provider  benzonatate (TESSALON) 100 MG capsule Take 1 capsule (100 mg total) by mouth every 8 (eight) hours. 04/28/22  Yes Jarita Raval K, PA-C  doxycycline (VIBRAMYCIN) 100 MG capsule Take 1 capsule (100 mg total) by mouth 2 (two) times daily. 04/28/22  Yes Mayar Whittier K, PA-C  carbidopa-levodopa (SINEMET CR) 50-200  MG tablet Take 1 tablet by mouth at bedtime. 02/27/22   Tat, Eustace Quail, DO  carbidopa-levodopa (SINEMET IR) 25-100 MG tablet TAKE 1 TABLET BY MOUTH 3 (THREE) TIMES DAILY. 7AM/11AM/4PM 03/27/22   Tat, Eustace Quail, DO  ibuprofen (ADVIL,MOTRIN) 200 MG tablet Take 200 mg by mouth every 6 (six) hours as needed.    [provider]  Multiple Vitamins-Minerals (PRESERVISION AREDS 2 PO) Take by mouth.    [provider]  pramipexole (MIRAPEX) 0.125 MG tablet 1 tablet three times per day for a week, then 2 po three times per day for a week Patient not taking: Reported on 04/28/2022 02/27/22   Tat, Eustace Quail, DO  pramipexole (MIRAPEX) 0.5 MG tablet Take 1 tablet (0.5 mg total) by mouth 3 (three) times daily. Patient not taking: Reported on 04/28/2022 02/27/22   Tat, Eustace Quail, DO  Probiotic Product (PROBIOTIC DAILY PO) Take by mouth.    [provider]    Family History Family History  Problem Relation Age of Onset   Cancer Mother        pancreatic   Arthritis Father        ?psoriatic   Parkinson's disease Father    Cancer Maternal Aunt        esophgeal   Cancer Paternal Grandmother        ovarian   Ovarian cancer Paternal Grandmother    Cancer Maternal Grandmother  Colon cancer Neg Hx     Social History Social History   Tobacco Use   Smoking status: Never   Smokeless tobacco: Never  Vaping Use   Vaping Use: Never used  Substance Use Topics   Alcohol use: Yes    Alcohol/week: 3.0 standard drinks    Types: 3 Cans of beer per week   Drug use: No     Allergies   Aloe   Review of Systems Review of Systems  Constitutional:  Positive for activity change. Negative for appetite change, fatigue and fever.  HENT:  Positive for congestion, sinus pressure and sore throat. Negative for sneezing.   Respiratory:  Positive for cough. Negative for shortness of breath.   Cardiovascular:  Negative for chest pain.  Gastrointestinal:  Negative for abdominal pain, diarrhea, nausea  and vomiting.  Neurological:  Negative for dizziness, light-headedness and headaches.    Physical Exam Triage Vital Signs ED Triage Vitals  Enc Vitals Group     BP 04/28/22 1053 (!) 151/79     Pulse Rate 04/28/22 1053 72     Resp 04/28/22 1053 20     Temp 04/28/22 1053 98.3 F (36.8 C)     Temp Source 04/28/22 1053 Oral     SpO2 04/28/22 1053 94 %     Weight --      Height --      Head Circumference --      Peak Flow --      Pain Score 04/28/22 1050 6     Pain Loc --      Pain Edu? --      Excl. in [place]? --    No data found.  Updated Vital Signs BP (!) 151/79 (BP Location: Left Arm) Comment (BP Location): large cuff  Pulse 72   Temp 98.3 F (36.8 C) (Oral)   Resp 20   SpO2 94%   Visual Acuity Right Eye Distance:   Left Eye Distance:   Bilateral Distance:    Right Eye Near:   Left Eye Near:    Bilateral Near:     Physical Exam Vitals reviewed.  Constitutional:      General: He is awake.     Appearance: Normal appearance. He is well-developed. He is not ill-appearing.     Comments: Very pleasant male appears stated age in no acute distress sitting comfortably in exam room  HENT:     Head: Normocephalic and atraumatic.     Right Ear: Tympanic membrane, ear canal and external ear normal. Tympanic membrane is not erythematous or bulging.     Left Ear: Tympanic membrane, ear canal and external ear normal. Tympanic membrane is not erythematous or bulging.     Nose: Nose normal.     Mouth/Throat:     Pharynx: Uvula midline. Posterior oropharyngeal erythema present. No oropharyngeal exudate or uvula swelling.     Tonsils: No tonsillar exudate or tonsillar abscesses.  Cardiovascular:     Rate and Rhythm: Normal rate and regular rhythm.     Heart sounds: Normal heart sounds, S1 normal and S2 normal. No murmur heard. Pulmonary:     Effort: Pulmonary effort is normal. No accessory muscle usage or respiratory distress.     Breath sounds: No stridor. Examination of the  right-lower field reveals wheezing. Examination of the left-lower field reveals wheezing. Wheezing present. No rhonchi or rales.  Abdominal:     General: Bowel sounds are normal.     Palpations: Abdomen is soft.  Tenderness: There is no abdominal tenderness.  Neurological:     Mental Status: He is alert.  Psychiatric:        Behavior: Behavior is cooperative.     UC Treatments / Results  Labs (all labs ordered are listed, but only abnormal results are displayed) Labs Reviewed - No data to display  EKG   Radiology DG Chest 2 View  Result Date: 04/28/2022 CLINICAL DATA:  Worsening cough. EXAM: CHEST - 2 VIEW COMPARISON:  None Available. FINDINGS: No pneumothorax. Linear opacities in the left base are consistent with atelectasis or scar. No suspicious infiltrate. The heart, hila, mediastinum, lungs, and pleura are otherwise unremarkable. IMPRESSION: Scar or atelectasis in the left base.  No other abnormalities. Electronically Signed   By: Dorise Bullion III M.D.   On: 04/28/2022 11:53    Procedures Procedures (including critical care time)  Medications Ordered in UC Medications - No data to display  Initial Impression / Assessment and Plan / UC Course  I have reviewed the triage vital signs and the nursing notes.  Pertinent labs & imaging results that were available during my care of the patient were reviewed by me and considered in my medical decision making (see chart for details).     No indication for viral testing as patient has been symptomatic for over a week and this would not change management.  Chest x-ray was obtained that showed left atelectasis with no evidence of pneumonia.  Will cover for sinobronchitis given prolonged and worsening symptoms.  Patient was started on doxycycline 100 mg twice daily for 10 days with instruction to avoid prolonged sun exposure due to photosensitivity associated with this medication.  Recommended over-the-counter medications including  Mucinex and Flonase for symptom relief.  He is to rest and drink plenty fluid.  Recommended follow-up with PCP next week.  If anything is worsening and he develops high fever, worsening cough, chest pain, shortness of breath, nausea, vomiting he needs to be seen immediately to which' \\he'$  expressed understanding.  Final Clinical Impressions(s) / UC Diagnoses   Final diagnoses:  Sinobronchitis  Acute cough     Discharge Instructions      The bottom of your lungs showed some scarring/they are not completely inflated but no evidence of infection which is great news.  We are going to cover for bronchitis.  Start doxycycline 100 mg twice daily for 10 days.  Make sure to stay out of the sun while on this medication as it can cause you a bad sunburn.  Continue Mucinex and over-the-counter medications for symptom relief.  I have called in a cough medicine.  Make sure you rest and drink plenty of fluid.  If your symptoms or not improving within a week or if anything worsens you need to be seen immediately.     ED Prescriptions     Medication Sig Dispense Auth. Provider   doxycycline (VIBRAMYCIN) 100 MG capsule Take 1 capsule (100 mg total) by mouth 2 (two) times daily. 20 capsule Terryn Rosenkranz K, PA-C   benzonatate (TESSALON) 100 MG capsule Take 1 capsule (100 mg total) by mouth every 8 (eight) hours. 21 capsule Tyrick Dunagan K, PA-C      PDMP not reviewed this encounter.   Terrilee Croak, PA-C 04/28/22 1210

## 2022-04-28 NOTE — Discharge Instructions (Addendum)
The bottom of your lungs showed some scarring/they are not completely inflated but no evidence of infection which is great news.  We are going to cover for bronchitis.  Start doxycycline 100 mg twice daily for 10 days.  Make sure to stay out of the sun while on this medication as it can cause you a bad sunburn.  Continue Mucinex and over-the-counter medications for symptom relief.  I have called in a cough medicine.  Make sure you rest and drink plenty of fluid.  If your symptoms or not improving within a week or if anything worsens you need to be seen immediately.

## 2022-04-28 NOTE — ED Triage Notes (Signed)
Cough and congestion started Sunday, one week ago.  A family member had similar symptoms at that time.  Patient has a cough, congestion, reports white phlegm.  No runny nose, no ear pain .  Reports post nasal drip and sore throat.  Reports drainage makes it difficult to lie down.    Has taken coricidin medication at home, minimal loosening of phlegm, unable to cough up phlegm

## 2022-07-03 ENCOUNTER — Encounter: Payer: Self-pay | Admitting: Family Medicine

## 2022-07-03 ENCOUNTER — Ambulatory Visit (INDEPENDENT_AMBULATORY_CARE_PROVIDER_SITE_OTHER): Payer: Medicare PPO | Admitting: Family Medicine

## 2022-07-03 DIAGNOSIS — Z125 Encounter for screening for malignant neoplasm of prostate: Secondary | ICD-10-CM

## 2022-07-03 DIAGNOSIS — Z0000 Encounter for general adult medical examination without abnormal findings: Secondary | ICD-10-CM

## 2022-07-03 DIAGNOSIS — Z23 Encounter for immunization: Secondary | ICD-10-CM | POA: Diagnosis not present

## 2022-07-03 LAB — HEPATIC FUNCTION PANEL
ALT: 9 U/L (ref 0–53)
AST: 17 U/L (ref 0–37)
Albumin: 4.6 g/dL (ref 3.5–5.2)
Alkaline Phosphatase: 61 U/L (ref 39–117)
Bilirubin, Direct: 0.2 mg/dL (ref 0.0–0.3)
Total Bilirubin: 1 mg/dL (ref 0.2–1.2)
Total Protein: 7.3 g/dL (ref 6.0–8.3)

## 2022-07-03 LAB — BASIC METABOLIC PANEL
BUN: 15 mg/dL (ref 6–23)
CO2: 31 mEq/L (ref 19–32)
Calcium: 10 mg/dL (ref 8.4–10.5)
Chloride: 100 mEq/L (ref 96–112)
Creatinine, Ser: 1.14 mg/dL (ref 0.40–1.50)
GFR: 66.95 mL/min (ref >60.00)
Glucose, Bld: 97 mg/dL (ref 70–99)
Potassium: 4.5 mEq/L (ref 3.5–5.1)
Sodium: 136 mEq/L (ref 135–145)

## 2022-07-03 LAB — CBC WITH DIFFERENTIAL/PLATELET
Basophils Absolute: 0 10*3/uL (ref 0.0–0.1)
Basophils Relative: 0.3 % (ref 0.0–3.0)
Eosinophils Absolute: 0.1 10*3/uL (ref 0.0–0.7)
Eosinophils Relative: 1 % (ref 0.0–5.0)
HCT: 45.3 % (ref 39.0–52.0)
Hemoglobin: 15.6 g/dL (ref 13.0–17.0)
Lymphocytes Relative: 21.5 % (ref 12.0–46.0)
Lymphs Abs: 1.5 10*3/uL (ref 0.7–4.0)
MCHC: 34.4 g/dL (ref 30.0–36.0)
MCV: 90.5 fl (ref 78.0–100.0)
Monocytes Absolute: 0.6 10*3/uL (ref 0.1–1.0)
Monocytes Relative: 8.3 % (ref 3.0–12.0)
Neutro Abs: 4.9 10*3/uL (ref 1.4–7.7)
Neutrophils Relative %: 68.9 % (ref 43.0–77.0)
Platelets: 237 10*3/uL (ref 150.0–400.0)
RBC: 5.01 Mil/uL (ref 4.22–5.81)
RDW: 13.5 % (ref 11.5–15.5)
WBC: 7.1 10*3/uL (ref 4.0–10.5)

## 2022-07-03 LAB — LIPID PANEL
Cholesterol: 209 mg/dL — ABNORMAL HIGH (ref 0–200)
HDL: 39.3 mg/dL (ref >39.00)
NonHDL: 169.42
Total CHOL/HDL Ratio: 5
Triglycerides: 229 mg/dL — ABNORMAL HIGH (ref 0.0–149.0)
VLDL: 45.8 mg/dL — ABNORMAL HIGH (ref 0.0–40.0)

## 2022-07-03 LAB — PSA: PSA: 0 ng/mL — ABNORMAL LOW (ref 0.10–4.00)

## 2022-07-03 LAB — LDL CHOLESTEROL, DIRECT: Direct LDL: 133 mg/dL

## 2022-07-03 LAB — TSH: TSH: 1.24 u[IU]/mL (ref 0.35–5.50)

## 2022-07-03 NOTE — Patient Instructions (Signed)
Remember Flu vaccine this Fall.   Consider Shingrix vaccine  We gave Prevnar 20 today which is a one time shot  Scale back sugars and starches.

## 2022-07-03 NOTE — Progress Notes (Signed)
Established Patient Office Visit  Subjective   Patient ID: James Collier, male    DOB: 1956-05-27  Age: 66 y.o. MRN: [id number]  Chief Complaint  Patient presents with   Annual Exam    HPI   James Collier  has history of Parkinson's disease now treated with Sinemet and also history of prostate cancer.  He is walking some for exercise.  Does have general fatigue issues.  Some stiffness.  No recent falls.  Has had some mild weight gain during the past year.  He knows he consumes more sugar than he should particularly informs of snacks and he drinks white grape juice.  Increased varicosities especially right lower extremity.  Mostly nonpainful.  Health maintenance reviewed:  -Tetanus due next year -Plans to get flu vaccine this fall -Needs pneumonia vaccination -Colonoscopy due 2027 -Prior hepatitis C screen negative  Family history significant for his mother having pancreatic cancer.  Father has psoriatic arthritis.  He had an aunt with esophageal cancer.  Social history-married.  Has a grown son.  Patient is non-smoker.  No regular alcohol.  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 He has never used smokeless tobacco. He reports current alcohol use of about 3.0 standard drinks of alcohol per week. He reports that he does not use drugs. family history includes Arthritis in his father; Cancer in his maternal aunt, maternal grandmother, mother, and paternal grandmother; Ovarian cancer in his paternal grandmother; Parkinson's disease in his father. Allergies  Allergen Reactions   Aloe     rash      Review of Systems  Constitutional:  Positive for  malaise/fatigue. Negative for chills, fever and weight loss.  HENT:  Negative for hearing loss.   Eyes:  Negative for blurred vision and double vision.  Respiratory:  Negative for cough and shortness of breath.   Cardiovascular:  Negative for chest pain, palpitations and leg swelling.  Gastrointestinal:  Negative for abdominal pain, blood in stool, constipation and diarrhea.  Genitourinary:  Negative for dysuria.  Skin:  Negative for rash.  Neurological:  Negative for dizziness, speech change, seizures, loss of consciousness and headaches.  Psychiatric/Behavioral:  Negative for depression.       Objective:     There were no vitals taken for this visit. BP Readings from Last 3 Encounters:  04/28/22 (!) 151/79  02/27/22 122/78  09/07/21 131/72   Wt Readings from Last 3 Encounters:  02/27/22 223 lb (101.2 kg)  09/07/21 215 lb 12.8 oz (97.9 kg)  03/29/21 210 lb (95.3 kg)      Physical Exam Constitutional:      General: He is not in acute distress.    Appearance: He is well-developed.  HENT:     Head: Normocephalic and atraumatic.     Right Ear: External ear normal.     Left Ear: External ear normal.  Eyes:     Conjunctiva/sclera: Conjunctivae normal.     Pupils: Pupils are equal, round, and reactive to light.  Neck:     Thyroid: No thyromegaly.  Cardiovascular:     Rate and  Rhythm: Normal rate and regular rhythm.     Heart sounds: Normal heart sounds. No murmur heard. Pulmonary:     Effort: No respiratory distress.     Breath sounds: No wheezing or rales.  Abdominal:     General: Bowel sounds are normal. There is no distension.     Palpations: Abdomen is soft. There is no mass.     Tenderness: There is no abdominal tenderness. There is no guarding or rebound.  Musculoskeletal:     Cervical back: Normal range of motion and neck supple.     Comments: Prominent varicose veins right lower extremity especially.  Good distal pulses.  Lymphadenopathy:     Cervical: No  cervical adenopathy.  Skin:    Findings: No rash.  Neurological:     Mental Status: He is alert and oriented to person, place, and time.     Cranial Nerves: No cranial nerve deficit.     Deep Tendon Reflexes: Reflexes normal.      No results found for any visits on 07/03/22.    The 10-year ASCVD risk score (Arnett DK, et al., 2019) is: 18.4%    Assessment & Plan:   Problem List Items Addressed This Visit   None Visit Diagnoses     Physical exam    -  Primary   Relevant Orders   Basic metabolic panel   CBC with Differential/Platelet   TSH   Lipid panel   Hepatic function panel   PSA   Need for pneumococcal 20-valent conjugate vaccination       Relevant Orders   Pneumococcal conjugate vaccine 20-valent (Prevnar 20) (Completed)     -Prevnar 20 given -Recommend annual flu vaccine.  He plans to wait till later -We did discuss Shingrix and he will consider -Scale back sugars and starches.  He did have some questions about the [facility].  He declines at this time.  Try to step up exercise -Obtain screening labs as above.  Patient does request PSA.  No follow-ups on file.    Carolann Littler, MD

## 2022-07-16 DIAGNOSIS — D225 Melanocytic nevi of trunk: Secondary | ICD-10-CM | POA: Diagnosis not present

## 2022-07-16 DIAGNOSIS — D2271 Melanocytic nevi of right lower limb, including hip: Secondary | ICD-10-CM | POA: Diagnosis not present

## 2022-07-16 DIAGNOSIS — L821 Other seborrheic keratosis: Secondary | ICD-10-CM | POA: Diagnosis not present

## 2022-07-16 DIAGNOSIS — L218 Other seborrheic dermatitis: Secondary | ICD-10-CM | POA: Diagnosis not present

## 2022-08-14 NOTE — Progress Notes (Unsigned)
Assessment/Plan:   1.  Parkinsons Disease  -continue carbidopa/levodopa 25/100 three times per day  -continue carbidopa/levodopa 50/200 CR at bed for cramping  -we discussed pramipexole along with r/b/se.  After a long discussion, we decided to start pramipexole and work to 0.5 mg 3 times per day.  -he needs to increase exercise  -We discussed that it used to be thought that levodopa would increase risk of melanoma but now it is believed that Parkinsons itself likely increases risk of melanoma. he is to get regular skin checks.  He is seeing Dr. Wilhemina Bonito  2.  Dysphagia  -MBE in May, 2022 with evidence of mild oropharyngeal dysphagia.  Regular solid as well as mechanical soft solids with thin liquids were recommended.  3.  SOB  -f/u with PCP re: inability to lie flat.  Told him could be related to cardiac issues.   Subjective:   James Collier was seen today in follow up for Parkinsons disease, diagnosed last visit.  My previous records were reviewed prior to todays visit as well as outside records available to me.  This patient is accompanied in the office by his spouse who supplements the history.  Pramipexole was added last visit but he admits he didn't take it.  He was worried about potential SE.  "I think I have had a steady, slow, but tangible, slide down."  No falls.  His exercise is working in the yard/cleaning house. He was in the emergency room in June but that was for bronchitis.  Seeing Dr. Wilhemina Bonito - dx with eczema.  As side note, c/o inability to lie on back b/c cannot breathe.  Current prescribed movement disorder medications: Carbidopa/levodopa 25/100, 1 tablet 3 times per day  Carbidopa/levodopa 50/200 CR at bedtime  Pramipexole, 0.5 mg 3 times per day (started last visit)  PREVIOUS MEDICATIONS: none to date  ALLERGIES:   Allergies  Allergen Reactions   Aloe     rash    CURRENT MEDICATIONS:  Outpatient Encounter Medications as of 08/16/2022  Medication Sig    carbidopa-levodopa (SINEMET CR) 50-200 MG tablet Take 1 tablet by mouth at bedtime.   carbidopa-levodopa (SINEMET IR) 25-100 MG tablet TAKE 1 TABLET BY MOUTH 3 (THREE) TIMES DAILY. 7AM/11AM/4PM   ibuprofen (ADVIL,MOTRIN) 200 MG tablet Take 200 mg by mouth every 6 (six) hours as needed.   Multiple Vitamins-Minerals (PRESERVISION AREDS 2 PO) Take by mouth.   Probiotic Product (PROBIOTIC DAILY PO) Take by mouth.   No facility-administered encounter medications on file as of 08/16/2022.    Objective:   PHYSICAL EXAMINATION:    VITALS:   Vitals:   08/16/22 0841  BP: 110/78  Pulse: 66  SpO2: 92%  Weight: 219 lb 12.8 oz (99.7 kg)  Height: '5\' 10"'$  (1.778 m)    GEN:  The patient appears stated age and is in NAD. HEENT:  Normocephalic, atraumatic.  The mucous membranes are moist. The superficial temporal arteries are without ropiness or tenderness. CV:  rrr Lungs:  ctab Neck:  no bruits  Neurological examination:  Orientation: The patient is alert and oriented x3. Cranial nerves: There is good facial symmetry with facial hypomimia. The speech is fluent and clear. Soft palate rises symmetrically and there is no tongue deviation. Hearing is intact to conversational tone. Sensation: Sensation is intact to light touch throughout Motor: Strength is at least antigravity x4.  Movement examination: Tone: There is mild to mod increased tone in the RUE and mild in the LUE  Abnormal movements: none even with distraction Coordination:  There is decremation, with any form of RAMS, including alternating supination and pronation of the forearm, hand opening and closing, finger taps, heel taps and toe taps, R>L Gait and Station: The patient has no difficulty arising out of a deep-seated chair without the use of the hands. He has decreased arm swing bilaterally  I have reviewed and interpreted the following labs independently    Chemistry      Component Value Date/Time   NA 136 07/03/2022 1054   K  4.5 07/03/2022 1054   CL 100 07/03/2022 1054   CO2 31 07/03/2022 1054   BUN 15 07/03/2022 1054   CREATININE 1.14 07/03/2022 1054      Component Value Date/Time   CALCIUM 10.0 07/03/2022 1054   ALKPHOS 61 07/03/2022 1054   AST 17 07/03/2022 1054   ALT 9 07/03/2022 1054   BILITOT 1.0 07/03/2022 1054       Lab Results  Component Value Date   WBC 7.1 07/03/2022   HGB 15.6 07/03/2022   HCT 45.3 07/03/2022   MCV 90.5 07/03/2022   PLT 237.0 07/03/2022    Lab Results  Component Value Date   TSH 1.24 07/03/2022     Total time spent on today's visit was 33 minutes, including both face-to-face time and nonface-to-face time.  Time included that spent on review of records (prior notes available to me/labs/imaging if pertinent), discussing treatment and goals, answering patient's questions and coordinating care.  Cc:  [name], MD

## 2022-08-16 ENCOUNTER — Telehealth: Payer: Self-pay

## 2022-08-16 ENCOUNTER — Encounter: Payer: Self-pay | Admitting: Neurology

## 2022-08-16 ENCOUNTER — Ambulatory Visit: Payer: Medicare PPO | Admitting: Neurology

## 2022-08-16 VITALS — BP 110/78 | HR 66 | Ht 70.0 in | Wt 219.8 lb

## 2022-08-16 DIAGNOSIS — G20 Parkinson's disease: Secondary | ICD-10-CM

## 2022-08-16 MED ORDER — PRAMIPEXOLE DIHYDROCHLORIDE 0.5 MG PO TABS: 0.5000 mg | ORAL_TABLET | Freq: Three times a day (TID) | ORAL | 1 refills | Status: DC

## 2022-08-16 NOTE — Telephone Encounter (Signed)
Patient scheduled for OV on 08/20/2022

## 2022-08-16 NOTE — Telephone Encounter (Signed)
-----   Message from [name], MD sent at 08/16/2022 10:06 AM EDT ----- Please see if we can get him in for 30 minute office follow up to further assess his dyspnea.   ----- Message ----- From: Ludwig Clarks, DO Sent: 08/16/2022   9:20 AM EDT To: [name], MD  Bruce, just fyi.  Pt having SOB when lying flat and can't lay on side at night b/c of it.  Told him to f/u with you.  Doesn't look edematous today.  Thanks!  rebecca

## 2022-08-16 NOTE — Patient Instructions (Addendum)
Start mirapex (pramipexole) as follows:  0.125 mg - 1 tablet three times per day for a week, then 2 tablets three times per day for a week and then fill the 0.5 mg tablet and take that, 1 pill three times per day  Continue carbidopa/levodopa 25/100 and 50/200 CR as previous  Local and Online Resources for Power over Parkinson's Group September 2023  LOCAL [place] PARKINSON'S GROUPS  Power over Parkinson's Group:   Power Over Parkinson's Patient Education Group will be Wednesday, September 13th-*Hybrid meting*- in person at [place] location and via [facility] at 2 pm.   Upcoming Power over [employer] Meetings:  2nd Wednesdays of the month at 2 pm:  September 13th, October 11th, November 8th Contact Amy Marriott at [email or web address] if interested in participating in this group Parkinson's Care Partners Group:    3rd Mondays, Contact Misty Paladino Atypical Parkinsonian Patient Group:   4th Wednesdays, Contact Misty Paladino If you are interested in participating in these groups with Misty, please contact her directly for how to join those meetings.  Her contact information is [email or web address].    LOCAL EVENTS AND NEW OFFERINGS New PWR! Moves Dynegy Instructor-Led Classes offering at [employer]!  Wednesdays 1-2 pm.   Contact Vonna Kotyk at  [email or web address] or Caron Presume at [employer], [email or web address] Dance for Parkinson 's classes will be on Tuesdays 9:30am-10:30am starting October 3-December 12 with a break the week of November 21 . Located in the [employer]  which is in the first floor of the [employer] ([place] for Parkinson's will be held on 2nd and 4th Mondays at 11:00am . First class will start  September 25th.  Located at the [place] ([place] Through support from the [place] and  [place] for Parkinson's classes are free for both patients and caregivers.  Contact Misty Taylor-Paladino for more details about registering.  [place]:  www.[email or web address] PD Health at Home continues:  Mindfulness Mondays, Wellness Wednesdays, Fitness Fridays  Upcoming Education:   Navigating Nutrition with PD.  Wednesday, Sept. 6th 1:00-2:00 pm Understanding Mind and Memory.  Wednesday, Sept. 20th 1:00-2:00 pm  Expert Briefing:    Parkinson's Disease and the Bladder.  Wednesday, Sept. 13th 1:00-2:00 pm Parkinson's and the Gut-Brain Connection.  Wednesday, Oct. 11th 1:00-2:00 pm Register for expert briefings (webinars) at [email or web address] Please check out their website to sign up for emails and see their full online offerings   [place]:  [email or web address]  Third Thursday Webinars:  On the third Thursday of every month at 12 p.m. ET, join our free live webinars to learn about various aspects of living with Parkinson's disease and our work to speed medical breakthroughs. Upcoming Webinar:  Stay tuned Check out additional information on their website to see their full online offerings  [employer]:  [email or web address] Upcoming Webinar:   Stay tuned Webinar Series:  Living with Parkinson's Meetup.   Third Thursdays each month, 3 pm Care Partner Monthly Meetup.  With Robin Searing Phinney.  First Tuesday of each month, 2 pm Check out additional information to Live Well Today on their website  Parkinson and Movement Disorders (PMD) Alliance:  www.pmdalliance.org [employer] Online:  Online Education Events Sign up for emails, which are sent weekly to give you updates on programming and online offerings  Parkinson's Association of the [place]:  www.[email or web address] Information on online support groups, education events, and  online exercises including Yoga, Parkinson's exercises and more-LOTS of information on links to PD resources and online events Virtual Support Group through Parkinson's Association of the [place]; next one is scheduled for Wednesday, October 4th at 2 pm. (No September meeting due to the symposium.  These are typically scheduled for the 1st Wednesday of the month at 2 pm).  Visit website for details. Register for "Caring for Parkinson's-Caring for You", 9th Annual Symposium.  In-person event in [place].  September 9th.  To register:  www.[email or web address].org/symposium-registration/?blm_aid=[email or web address] MOVEMENT AND EXERCISE OPPORTUNITIES PWR! Moves Classes at [place].  Wednesdays 10 and 11 am.   Contact Amy Marriott, PT [email or web address] if interested. NEW PWR! Moves Class offerings at [employer].  Wednesdays 1-2 pm.  Contact Vonna Kotyk at  [email or web address] or Caron Presume at [place],  [email or web address] Parkinson's Wellness Recovery (PWR! Moves)  www.[email or web address] Info on the PWR! Virtual Experience:  You will have access to our expertise through self-assessment, guided plans that start with the PD-specific fundamentals, educational content, tips, Q&A with an expert, and a growing [employer] of PD-specific pre-recorded and live exercise classes of varying types and intensity - both physical and cognitive! If that is not enough, we offer 1:1 wellness consultations (in-person or virtual) to personalize your PWR! Art therapist.  [place] Fridays:  As part of the PD Health @ Home program, this free video series focuses each week on one aspect of fitness designed to support people living with Parkinson's.  These weekly videos highlight the [place] recent fitness guidelines for people with Parkinson's disease. [address] Dance for PD website is offering free,  live-stream classes throughout the week, as well as links to ModemGamers.si of classes:  [email or web address]://danceforparkinsons.org/ Virtual dance and Pilates for Parkinson's classes: Click on the Community Tab> Parkinson's Movement Initiative Tab.  To register for classes and for more information, visit www.[employer] and click the "community" tab.  [employer] Parkinson's Cycling Classes  [employer]:  Thursdays @ Noon-Live classes at 01-10-1987 ([employer] [email or web address] or ([phone]) [employer]: Virtual Classes Mondays and Thursdays 01-10-1987 classes Tuesday, Wednesday and Thursday (contact Ursa at [email or web address]  or ([phone]) [place] Varied levels of classes are offered Tuesdays and Thursdays at [facility].  Stretching with [facility] weekly class is also offered for people with Parkinson's To observe a class or for more information, call [phone] or email [phone] at [email or web address] ADDITIONAL SUPPORT AND RESOURCES Well[employer]Spring [employer]:Online Caregiver Education Opportunities:  www.well-springsolutions.org/caregiver-education/caregiver-support-group.  You may also contact Hezzie Bump at [email or web address] or [phone].    Coping with Difficult Caregiver Emotions.  Wednesday, September 20th, 10:30 am-12.  The [facility], [facility] Navigating the Maze of Senior Care Options.  Thursday, September 28th, 4-5:15 pm.  The [facility]. Well-Spring Navigator:  05-26-1997 program, a free service to help individuals and families through the journey of determining care for older adults.  The "Navigator" is a [facility], [employer], who will speak with a prospective client and/or loved ones to provide an assessment of the situation and a set of recommendations for a personalized care plan -- all free of charge, and whether Well-Spring [employer] offers the  needed service or not. If the need is not a service we provide, we are well-connected with reputable programs in town that we can refer you to.  www.well-springsolutions.org or to speak with the Navigator, call [phone].

## 2022-08-20 ENCOUNTER — Ambulatory Visit: Payer: Medicare PPO | Admitting: Family Medicine

## 2022-08-20 VITALS — BP 132/70 | HR 68 | Temp 97.7°F | Ht 70.0 in | Wt 221.5 lb

## 2022-08-20 DIAGNOSIS — R0600 Dyspnea, unspecified: Secondary | ICD-10-CM

## 2022-08-20 NOTE — Progress Notes (Signed)
Established Patient Office Visit  Subjective   Patient ID: James Collier, male    DOB: [birth year]  Age: 66 y.o. MRN: [id number]  Chief Complaint  Patient presents with   Shortness of Breath    Patient complains of shortness of breath, x2 months   Cough    Patient complains of cough, Productive cough      HPI   Patient relates for about couple months now he has had some shortness of breath sometimes when he lays flat.  He had mentioned this to his neurologist and they had sent [place] a note regarding this and we recommended further evaluation.  He relates his weight has been stable.  He has not had any increased peripheral edema.  He had chest x-ray back in June which was unremarkable.  He rides exercise bike and also push mows without any significant difficulty.  No exertional chest pains.  No dizziness or syncope.  No dyspnea with activity such as exercise bike or push mowing.  No observed sleep apnea.  His weight is stable compared with back in April.  No family history of premature CAD.  Denies any recent pleuritic pain or any productive cough at this time.  No recent fever.  No active GERD symptoms.  Past Medical History:  Diagnosis Date   Allergy    Blood in stool    Cancer ([place]) 11/26/2010   prostate   Parkinson disease [facility])    Past Surgical History:  Procedure Laterality Date   CATARACT EXTRACTION Right    HEMORROIDECTOMY  1986   PROSTATE SURGERY  2011   TONSILLECTOMY  1965    reports that he has never smoked. He has never used smokeless tobacco. He reports current alcohol use of about 3.0 standard drinks of alcohol per week. He reports that he does not use drugs. family history includes Arthritis in his father; Cancer in his maternal aunt, maternal grandmother, mother, and paternal grandmother; Ovarian cancer in his paternal grandmother; Parkinson's disease in his father. Allergies  Allergen Reactions   Aloe     rash     Review of Systems  Constitutional:   Negative for chills, fever and malaise/fatigue.  HENT:  Negative for sinus pain and sore throat.   Respiratory:  Negative for hemoptysis, sputum production, shortness of breath and wheezing.   Cardiovascular:  Negative for chest pain, palpitations and PND.      Objective:     BP 132/70 (BP Location: Left Arm, Patient Position: Sitting, Cuff Size: Large)   Pulse 68   Temp 97.7 F (36.5 C) (Oral)   Ht '5\' 10"'$  (1.778 m)   Wt 221 lb 8 oz (100.5 kg)   SpO2 96%   BMI 31.78 kg/m  BP Readings from Last 3 Encounters:  08/20/22 132/70  08/16/22 110/78  04/28/22 (!) 151/79   Wt Readings from Last 3 Encounters:  08/20/22 221 lb 8 oz (100.5 kg)  08/16/22 219 lb 12.8 oz (99.7 kg)  02/27/22 223 lb (101.2 kg)      Physical Exam Vitals reviewed.  Constitutional:      Appearance: He is well-developed.  HENT:     Head: Normocephalic and atraumatic.  Cardiovascular:     Rate and Rhythm: Normal rate and regular rhythm.     Heart sounds: No murmur heard. Pulmonary:     Effort: Pulmonary effort is normal.     Breath sounds: No decreased breath sounds, wheezing or rales.  Musculoskeletal:     Cervical back: Normal  range of motion.     Comments: No pitting edema  Neurological:     Mental Status: He is alert.      No results found for any visits on 08/20/22.  Last CBC Lab Results  Component Value Date   WBC 7.1 07/03/2022   HGB 15.6 07/03/2022   HCT 45.3 07/03/2022   MCV 90.5 07/03/2022   MCH 31.9 05/24/2012   RDW 13.5 07/03/2022   PLT 237.0 93/57/0177   Last metabolic panel Lab Results  Component Value Date   GLUCOSE 97 07/03/2022   NA 136 07/03/2022   K 4.5 07/03/2022   CL 100 07/03/2022   CO2 31 07/03/2022   BUN 15 07/03/2022   CREATININE 1.14 07/03/2022   CALCIUM 10.0 07/03/2022   PROT 7.3 07/03/2022   ALBUMIN 4.6 07/03/2022   BILITOT 1.0 07/03/2022   ALKPHOS 61 07/03/2022   AST 17 07/03/2022   ALT 9 07/03/2022   Last thyroid functions Lab Results   Component Value Date   TSH 1.24 07/03/2022      The 10-year ASCVD risk score (Arnett DK, et al., 2019) is: 16.8%    Assessment & Plan:   Problem List Items Addressed This Visit   None Visit Diagnoses     Dyspnea, unspecified type    -  Primary   Relevant Orders   EKG 12-Lead (Completed)   ECHOCARDIOGRAM COMPLETE     Patient relates a couple month history of sensation of shortness of breath when he lays flat.  He states that he feels somewhat "full "when laying flat on his back but not as much on his side.  Not sure this represents any true orthopnea.  He has not had any recent rapid weight gain, increased peripheral edema, PND, etc. no cardiac history.  EKG today in office shows normal sinus rhythm.  No acute ST-T changes and no significant changes compared with prior tracing 2017  He had recent chest x-ray back in June which was unremarkable  We discussed setting up echocardiogram to further assess. -Watch for any weight gain or exertional dyspnea or new symptoms such as chest pain.  No follow-ups on file.    Carolann Littler, MD

## 2022-08-20 NOTE — Patient Instructions (Signed)
We are setting up Echocardiogram to further assess.

## 2022-08-21 ENCOUNTER — Telehealth (HOSPITAL_BASED_OUTPATIENT_CLINIC_OR_DEPARTMENT_OTHER): Payer: Self-pay | Admitting: *Deleted

## 2022-08-21 ENCOUNTER — Other Ambulatory Visit: Payer: Self-pay | Admitting: Neurology

## 2022-08-21 NOTE — Telephone Encounter (Signed)
Left message for patient to call and schedule the Echocardiogram ordered by Dr. Elease Hashimoto

## 2022-09-05 ENCOUNTER — Ambulatory Visit (INDEPENDENT_AMBULATORY_CARE_PROVIDER_SITE_OTHER): Payer: Medicare PPO

## 2022-09-05 DIAGNOSIS — R0600 Dyspnea, unspecified: Secondary | ICD-10-CM | POA: Diagnosis not present

## 2022-09-05 LAB — ECHOCARDIOGRAM COMPLETE
AV Vena cont: 0.5 cm
Area-P 1/2: 3.37 cm2
MV M vel: 4.28 m/s
MV Peak grad: 73.1 mmHg
P 1/2 time: 502 msec
S' Lateral: 2.74 cm

## 2022-09-06 ENCOUNTER — Ambulatory Visit: Payer: Medicare PPO | Attending: Neurology

## 2022-09-06 ENCOUNTER — Ambulatory Visit: Payer: Medicare PPO | Admitting: Occupational Therapy

## 2022-09-06 ENCOUNTER — Ambulatory Visit: Payer: Medicare PPO | Admitting: Physical Therapy

## 2022-09-06 DIAGNOSIS — R29818 Other symptoms and signs involving the nervous system: Secondary | ICD-10-CM

## 2022-09-06 DIAGNOSIS — R471 Dysarthria and anarthria: Secondary | ICD-10-CM | POA: Insufficient documentation

## 2022-09-06 DIAGNOSIS — R2689 Other abnormalities of gait and mobility: Secondary | ICD-10-CM

## 2022-09-06 NOTE — Therapy (Signed)
[facility] [address], [place], [place], [address] Phone: [phone]   Fax:  [phone]  Patient Details  Name: James Collier MRN: [id number] Date of Birth: 1956/10/02 Referring Provider:  [name], MD  Encounter Date: 09/06/2022  Physical Therapy Parkinson's Disease Screen   Timed Up and Go test:10.81 sec (compared to 9.82 sec)  10 meter walk test:9.75 sec (3.36 ft/sec)  5 time sit to stand test: requests not to perform due to arthritic pain  Patient does not require Physical Therapy services at this time.  Recommend Physical Therapy screen in 6-9 months.  Reports doing previous HEP exercises 3-4 times per week.  No reports of falls.  Cleopha Indelicato W., PT 09/06/2022, 7:42 AM  [facility] [address], [place], [place], [address] Phone: [phone]   Fax:  [phone]

## 2022-09-06 NOTE — Therapy (Signed)
[facility] [address] [place], [place], [address] Phone: [phone]   Fax:  [phone]  Patient Details  Name: James Collier MRN: [id number] Date of Birth: Jul 03, 1956 Referring Provider:  Alonza Bogus, DO  Encounter Date: 09/06/2022  Speech Therapy Parkinson's Disease Screen   Decibel Level today: mid 60sdB  (WNL=70-72 dB) with sound level meter 30cm away from pt's mouth. Pt's conversational volume is lower than WNL.   Pt does not not report difficulty with swallowing, which does not warrant further evaluation  Pt would benefit from speech-language eval for dysarthria - please order via EPIC or call [phone] to schedule   [facility] [place] 09/06/2022, 11:58 AM  [facility] [address] [place], [place], [address] Phone: [phone]   Fax:  [phone]

## 2022-09-06 NOTE — Therapy (Signed)
[facility] [address], [place], [place], [address] Phone: [phone]   Fax:  [phone]  Patient Details  Name: James Collier MRN: [id number] Date of Birth: July 14, 1956 Referring Provider:  [name], MD  Encounter Date: 09/06/2022  Occupational Therapy Parkinson's Disease Screen  Hand dominance:  Left   Physical Performance Test item #2 (simulated eating):  11.06 sec  Fastening/unfastening 3 buttons in:  28.22 sec  9-hole peg test:    RUE  36.6 sec        LUE  30.19 sec  Change in ability to perform ADLs/IADLs:  Pt reports no changes, but dreads putting socks on.  Pt does not require occupational therapy services at this time.  Recommended occupational therapy screen in   6-8 months   Keylie Beavers, [place], OTR/L 09/06/2022, 12:22 PM  [facility] [address], [place], [place], [address] Phone: [phone]   Fax:  [phone]

## 2022-09-26 ENCOUNTER — Other Ambulatory Visit: Payer: Self-pay | Admitting: Neurology

## 2022-09-26 DIAGNOSIS — G20A1 Parkinson's disease without dyskinesia, without mention of fluctuations: Secondary | ICD-10-CM

## 2022-11-15 ENCOUNTER — Telehealth (INDEPENDENT_AMBULATORY_CARE_PROVIDER_SITE_OTHER): Payer: Medicare PPO | Admitting: Family Medicine

## 2022-11-15 ENCOUNTER — Encounter: Payer: Self-pay | Admitting: Family Medicine

## 2022-11-15 DIAGNOSIS — Z0000 Encounter for general adult medical examination without abnormal findings: Secondary | ICD-10-CM

## 2022-11-15 NOTE — Patient Instructions (Signed)
I really enjoyed getting to talk with you today! I am available on Tuesdays and Thursdays for virtual visits if you have any questions or concerns, or if I can be of any further assistance.   CHECKLIST FROM ANNUAL WELLNESS VISIT:  -Follow up (please call to schedule if not scheduled after visit):  -yearly for annual wellness visit with primary care office  Here is a list of your preventive care/health maintenance measures and the plan for each if any are due:  Health Maintenance  Topic Date Due   Zoster Vaccines- Shingrix (1 of 2) Never done   DTaP/Tdap/Td (2 - Td or Tdap) 04/14/2023   Medicare Annual Wellness (AWV)  11/16/2023   COLONOSCOPY (Pts 45-76yr Insurance coverage will need to be confirmed)  11/15/2026   Pneumonia Vaccine 66 Years old  Completed   INFLUENZA VACCINE  Completed   COVID-19 Vaccine  Completed   Hepatitis C Screening  Completed   HPV VACCINES  Aged Out    -See a dentist at least yearly  -Get your eyes checked and then per your eye specialist's recommendations  -Other issues addressed today: -I included the advanced directive information and balance exercise information below  -I have included below further information regarding a healthy whole foods based diet, physical activity guidelines for adults, stress management and opportunities for social connections. I hope you find this information useful.     NUTRITION: -eat real food: lots of colorful vegetables (half the plate) and fruits -5-7 servings of vegetables and fruits per day (fresh or steamed is best), exp. 2 servings of vegetables with lunch and dinner and 2 servings of fruit per day. Berries and greens such as kale and collards are great choices.   -consume on a regular basis: whole grains (make sure first ingredient on label contains the word "whole"), fresh fruits, fish, nuts, seeds, healthy oils (such as olive oil, avocado oil, grape seed oil) -may eat small amounts of dairy and lean meat on occasion, but avoid processed meats such as ham, bacon, lunch meat, etc. -drink water -try to avoid fast food and pre-packaged foods, processed meat -most experts advise limiting sodium to < '2300mg'$  per day, should limit further is any chronic conditions such as high blood pressure, heart disease, diabetes, etc. The American Heart Association advised that < '1500mg'$  is is ideal -try to avoid foods that contain any ingredients with names you do not recognize  -try to avoid sugar/sweets (except for the natural sugar that occurs in fresh fruit) -try to avoid sweet drinks -try to avoid white rice, white bread, pasta (unless whole grain), white or yellow potatoes  EXERCISE GUIDELINES FOR ADULTS: -if you wish to increase your physical activity, do so gradually and with the approval of your doctor -STOP and seek medical care immediately if you have any chest pain, chest discomfort or trouble breathing when starting or increasing exercise  -move and stretch your body, legs, feet and arms when sitting for long periods -Physical activity guidelines for optimal health in adults: -least 150 minutes per week of aerobic exercise (can talk, but not sing) once approved by your doctor, 20-30 minutes of sustained activity or two 10 minute episodes of sustained activity every day.  -resistance training at least 2 days per week if approved by your doctor -balance exercises 3+ days per week:   Stand somewhere where you have something sturdy to hold onto if you lose balance.    1) lift up on toes, start with 5x per day and  work up to 20x   2) stand and lift on leg straight out to the side so that foot is a few inches of the floor, start with 5x each side and work up to 20x  each side   3) stand on one foot, start with 5 seconds each side and work up to 20 seconds on each side  If you need ideas or help with getting more active:  -Silver sneakers https://tools.silversneakers.com  -Walk with a Doc: http://stephens-thompson.biz/  -try to include resistance (weight lifting/strength building) and balance exercises twice per week: or the following link for ideas: ChessContest.fr  UpdateClothing.com.cy  STRESS MANAGEMENT: -can try meditating, or just sitting quietly with deep breathing while intentionally relaxing all parts of your body for 5 minutes daily -if you need further help with stress, anxiety or depression please follow up with your primary doctor or contact the wonderful folks at [place]: [place]: -options in [place] if you wish to engage in more social and exercise related activities:  -Silver sneakers https://tools.silversneakers.com  -Walk with a Doc: http://stephens-thompson.biz/  -Check out the [place] 50+ section on the [place] [employer] (hiking clubs, book clubs, cards and games, chess, exercise classes, aquatic classes and much more) - see the website for details: https://www.[place].gov/departments/parks-recreation/active-adults50  -YouTube has lots of exercise videos for different ages and abilities as well  -[place] (a variety of indoor and outdoor inperson activities for adults). ([phone]. [address]  -Virtual Online Classes (a variety of topics): see seniorplanet.org or call [phone]  -consider volunteering at a school, hospice center, church, senior center or elsewhere    ADVANCED HEALTHCARE DIRECTIVES:  Everyone should have advanced health care directives in place. This is so that you get the care you want, should you ever be in a  situation where you are unable to make your own medical decisions.   From the [place] Advanced Directive Website: "[place] are legal documents in which you give written instructions about your health care if, in the future, you cannot speak for yourself.   A health care power of attorney allows you to name a person you trust to make your health care decisions if you cannot make them yourself. A declaration of a desire for a natural death (or living will) is document, which states that you desire not to have your life prolonged by extraordinary measures if you have a terminal or incurable illness or if you are in a vegetative state. An advance instruction for mental health treatment makes a declaration of instructions, information and preferences regarding your mental health treatment. It also states that you are aware that the advance instruction authorizes a mental health treatment provider to act according to your wishes. It may also outline your consent or refusal of mental health treatment. A declaration of an anatomical gift allows anyone over the age of 88 to make a gift by will, organ donor card or other document."   Please see the following website or an elder law attorney for forms, FAQs and for completion of advanced directives: [place] Secretary of [place] ([email or web address])  Or copy and paste the following to your web browser: [email or web address]

## 2022-11-15 NOTE — Progress Notes (Addendum)
PATIENT CHECK-IN and HEALTH RISK ASSESSMENT QUESTIONNAIRE:  -completed by phone/video for upcoming Medicare Preventive Visit  Pre-Visit Check-in: 1)Vitals (height, wt, BP, etc) - record in vitals section for visit on day of visit 2)Review and Update Medications, Allergies PMH, Surgeries, Social history in Epic 3)Hospitalizations in the last year with date/reason?  None 4)Review and Update Care Team (patient's specialists) in Epic 5) Complete PHQ9 in Epic  6) Complete Fall Screening in Epic 7)Review all Health Maintenance Due and order under PCP if not done.  Medicare Wellness Patient Questionnaire:  Answer theses question about your habits: Do you drink alcohol? yes If yes, how many drinks do you have a day?1 or 2 beers biweekly  Have you ever smoked?no Quit date if applicable? NO How many packs a day do/did you smoke? N/A Do you use smokeless tobacco? NO Do you use an illicit drugs? No Do you exercises? Yes  IF so, what type and how many days/minutes per week?3 to 4 times per week.   30- 60 mins Are you sexually active? No Number of partners? 1 Typical diet: eats out some, they try to eat a balanced diet, cereal, sandwich, admits needs to cut back on meat and portions, broccoli, green bean  Beverages: water, juice  Answer theses question about you: Can you perform most household chores?yes Do you find it hard to follow a conversation in a noisy room? Yes, but feels is mild and does not feel needs hearing aides Do you often ask people to speak up or repeat themselves. sometimes Do you feel that you have a problem with memory?no Do you balance your checkbook and or bank acounts?no wife does Do you feel safe at home? Yes Last dentist visit?Yesterday Do you need assistance with any of the following: Please note if so  No assistance needed  Driving?  Feeding yourself?  Getting from bed to chair?  Getting to the toilet?  Bathing or showering?  Dressing yourself?  Managing  money?  Climbing a flight of stairs  Preparing meals?    Do you have Advanced Directives in place (Living Will, Healthcare Power or Attorney)? No   Last eye Exam and location? [facility]    Do you currently use prescribed or non-prescribed narcotic or opioid pain medications?None  Do you have a history or close family history of breast, ovarian, tubal or peritoneal cancer or a family member with BRCA (breast cancer susceptibility 1 and 2) gene mutations?  Nurse/Assistant Credentials/time stamp:   ----------------------------------------------------------------------------------------------------------------------------------------------------------------------------------------------------------------------    MEDICARE ANNUAL PREVENTIVE CARE VISIT WITH PROVIDER (Welcome to [employer], initial annual wellness or annual wellness exam)  Virtual Visit via Phone Note  I connected with James Collier  on 11/15/22 by a video enabled telemedicine application and verified that I am speaking with the correct person using two identifiers.  Location patient: home Location provider:work or home office Persons participating in the virtual visit: patient, provider  Concerns and/or follow up today: none   See HM section in Epic for other details of completed HM.    ROS: negative for report of fevers, unintentional weight loss, vision changes, vision loss, hearing loss or change, chest pain, sob, hemoptysis, melena, hematochezia, hematuria, genital discharge or lesions, falls, bleeding or bruising, loc, thoughts of suicide or self harm, memory loss  Patient-completed extensive health risk assessment - reviewed and discussed with the patient: See Health Risk Assessment completed with patient prior to the visit either above or in recent phone note. This was reviewed in detailed with the patient  today and appropriate recommendations, orders and referrals were placed as needed per Summary  below and patient instructions.   Review of Medical History: -PMH, PSH, Family History and current specialty and care providers reviewed and updated and listed below   Patient Care Team: [name], MD as PCP - General (Family Medicine) Tat, Eustace Quail, DO as Consulting Physician (Neurology)   Past Medical History:  Diagnosis Date   Allergy    Blood in stool    Cancer ([place]) 11/26/2010   prostate   Parkinson disease     Past Surgical History:  Procedure Laterality Date   CATARACT EXTRACTION Right    [place]    Social History   Socioeconomic History   Marital status: Married    Spouse name: Venkat Ankney    Number of children: 1   Years of education: Not on file   Highest education level: Not on file  Occupational History   Not on file  Tobacco Use   Smoking status: Never   Smokeless tobacco: Never  Vaping Use   Vaping Use: Never used  Substance and Sexual Activity   Alcohol use: Yes    Alcohol/week: 3.0 standard drinks of alcohol    Types: 3 Cans of beer per week   Drug use: No   Sexual activity: Not on file  Other Topics Concern   Not on file  Social History Narrative   Left Handed    Lives in a one story home    Drinks Caffeine    Social Determinants of Health   Financial Resource Strain: Not on file  Food Insecurity: Not on file  Transportation Needs: Not on file  Physical Activity: Not on file  Stress: Not on file  Social Connections: Not on file  Intimate Partner Violence: Not on file    Family History  Problem Relation Age of Onset   Cancer Mother        pancreatic   Arthritis Father        ?psoriatic   Parkinson's disease Father    Cancer Maternal Aunt        esophgeal   Cancer Paternal Grandmother        ovarian   Ovarian cancer Paternal Grandmother    Cancer Maternal Grandmother    Colon cancer Neg Hx     Current Outpatient Medications on File Prior to Visit   Medication Sig Dispense Refill   carbidopa-levodopa (SINEMET CR) 50-200 MG tablet TAKE 1 TABLET BY MOUTH EVERYDAY AT BEDTIME 90 tablet 1   carbidopa-levodopa (SINEMET IR) 25-100 MG tablet TAKE 1 TABLET BY MOUTH 3 (THREE) TIMES DAILY. 7AM/11AM/4PM 270 tablet 0   ibuprofen (ADVIL,MOTRIN) 200 MG tablet Take 200 mg by mouth every 6 (six) hours as needed.     Multiple Vitamins-Minerals (PRESERVISION AREDS 2 PO) Take by mouth.     pramipexole (MIRAPEX) 0.5 MG tablet Take 1 tablet (0.5 mg total) by mouth 3 (three) times daily. 270 tablet 1   Probiotic Product (PROBIOTIC DAILY PO) Take by mouth.     No current facility-administered medications on file prior to visit.    Allergies  Allergen Reactions   Aloe     rash       Physical Exam There were no vitals filed for this visit. Estimated body mass index is 31.78 kg/m as calculated from the following:   Height as of 08/20/22: _0  (1.778 m).  Weight as of 08/20/22: 221 lb 8 oz (100.5 kg).  EKG (optional): deferred due to virtual visit  GENERAL: alert, oriented, no audible sounds of distress  HEENT: vision exam deferred due to virtual  PSYCH/NEURO: pleasant and cooperative, no obvious depression or anxiety, speech and thought processing grossly intact, Cognitive function grossly intact        07/03/2022   10:06 AM 05/24/2020    7:59 AM 08/29/2018    8:05 AM  Depression screen PHQ 2/9  Decreased Interest 1 0 0  Down, Depressed, Hopeless 0 0 0  PHQ - 2 Score 1 0 0  Refused depression screening. Reports does      09/07/2021   10:48 AM 02/27/2022    8:33 AM 04/28/2022   10:53 AM 08/16/2022    8:42 AM 11/15/2022   10:28 AM  Fall Risk  Falls in the past year? 0 0  0 1  Was there an injury with Fall? 0   0 0  Fall Risk Category Calculator 0    1  Fall Risk Category Low    Low  Patient Fall Risk Level   Low fall risk  Low fall risk     SUMMARY AND PLAN:  Medicare annual wellness visit, subsequent   Discussed applicable  health maintenance/preventive health measures and advised and referred or ordered per patient preferences:  Health Maintenance  Topic Date Due   Zoster Vaccines- Shingrix (1 of 2) Never done   DTaP/Tdap/Td (2 - Td or Tdap) 04/14/2023   Medicare Annual Wellness (AWV)  11/16/2023   COLONOSCOPY (Pts 45-39yr Insurance coverage will need to be confirmed)  11/15/2026   Pneumonia Vaccine 66 Years old  Completed   INFLUENZA VACCINE  Completed   COVID-19 Vaccine  Completed   Hepatitis C Screening  Completed   HPV VACCINES  Aged Out   Education and counseling on the following was provided based on the above review of health and a plan/checklist for the patient, along with additional information discussed, was provided for the patient in the patient instructions :  -Advised on importance of and resources for completing advanced directives. Information provided. He reports is on his to do list.  -Advise a whole foods based healthy diet and regular exercise: discussed a heart healthy whole foods based diet at length. A summary of a healthy diet was provided in the Patient Instructions.Recommended regular exercise and discussed options within the community.  -Advised yearly dental visits at minimum and regular eye exams -Advised and counseled on alcohol safe limits/risks/etc.   Follow up: see patient instructions   Patient Instructions  I really enjoyed getting to talk with you today! I am available on Tuesdays and Thursdays for virtual visits if you have any questions or concerns, or if I can be of any further assistance.   CHECKLIST FROM ANNUAL WELLNESS VISIT:  -Follow up (please call to schedule if not scheduled after visit):  -yearly for annual wellness visit with primary care office  Here is a list of your preventive care/health maintenance measures and the plan for each if any are due:  Health Maintenance  Topic Date Due   Zoster Vaccines- Shingrix (1 of 2) Never done   DTaP/Tdap/Td (2  - Td or Tdap) 04/14/2023   Medicare Annual Wellness (AWV)  11/16/2023   COLONOSCOPY (Pts 45-486yrInsurance coverage will need to be confirmed)  11/15/2026   Pneumonia Vaccine [age]Years old  Completed   INFLUENZA VACCINE  Completed   COVID-19 Vaccine  Completed  Hepatitis C Screening  Completed   HPV VACCINES  Aged Out    -See a dentist at least yearly  -Get your eyes checked and then per your eye specialist's recommendations  -Other issues addressed today: -I included the advanced directive information and balance exercise information below  -I have included below further information regarding a healthy whole foods based diet, physical activity guidelines for adults, stress management and opportunities for social connections. I hope you find this information useful.   -----------------------------------------------------------------------------------------------------------------------------------------------------------------------------------------------------------------------------------------------------------  NUTRITION: -eat real food: lots of colorful vegetables (half the plate) and fruits -5-7 servings of vegetables and fruits per day (fresh or steamed is best), exp. 2 servings of vegetables with lunch and dinner and 2 servings of fruit per day. Berries and greens such as kale and collards are great choices.  -consume on a regular basis: whole grains (make sure first ingredient on label contains the word "whole"), fresh fruits, fish, nuts, seeds, healthy oils (such as olive oil, avocado oil, grape seed oil) -may eat small amounts of dairy and lean meat on occasion, but avoid processed meats such as ham, bacon, lunch meat, etc. -drink water -try to avoid fast food and pre-packaged foods, processed meat -most experts advise limiting sodium to < 2324m per day, should limit further is any chronic conditions such as high blood pressure, heart disease, diabetes, etc. The American  Heart Association advised that < 15036mis is ideal -try to avoid foods that contain any ingredients with names you do not recognize  -try to avoid sugar/sweets (except for the natural sugar that occurs in fresh fruit) -try to avoid sweet drinks -try to avoid white rice, white bread, pasta (unless whole grain), white or yellow potatoes  EXERCISE GUIDELINES FOR ADULTS: -if you wish to increase your physical activity, do so gradually and with the approval of your doctor -STOP and seek medical care immediately if you have any chest pain, chest discomfort or trouble breathing when starting or increasing exercise  -move and stretch your body, legs, feet and arms when sitting for long periods -Physical activity guidelines for optimal health in adults: -least 150 minutes per week of aerobic exercise (can talk, but not sing) once approved by your doctor, 20-30 minutes of sustained activity or two 10 minute episodes of sustained activity every day.  -resistance training at least 2 days per week if approved by your doctor -balance exercises 3+ days per week:   Stand somewhere where you have something sturdy to hold onto if you lose balance.    1) lift up on toes, start with 5x per day and work up to 20x   2) stand and lift on leg straight out to the side so that foot is a few inches of the floor, start with 5x each side and work up to 20x each side   3) stand on one foot, start with 5 seconds each side and work up to 20 seconds on each side  If you need ideas or help with getting more active:  -Silver sneakers https://tools.silversneakers.com  -Walk with a Doc: Hthttp://stephens-thompson.biz/-try to include resistance (weight lifting/strength building) and balance exercises twice per week: or the following link for ideas: htChessContest.frhtUpdateClothing.com.cySTRESS MANAGEMENT: -can try meditating, or just  sitting quietly with deep breathing while intentionally relaxing all parts of your body for 5 minutes daily -if you need further help with stress, anxiety or depression please follow up with your primary doctor or contact the wonderful folks at [employer]  Behavioral Health: [phone]  SOCIAL CONNECTIONS: -options in [place] if you wish to engage in more social and exercise related activities:  -Silver sneakers https://tools.silversneakers.com  -Walk with a Doc: http://stephens-thompson.biz/  -Check out the [place] 50+ section on the [place] [employer] (hiking clubs, book clubs, cards and games, chess, exercise classes, aquatic classes and much more) - see the website for details: https://www.[place].gov/departments/parks-recreation/active-adults50  -YouTube has lots of exercise videos for different ages and abilities as well  -[place] (a variety of indoor and outdoor inperson activities for adults). [phone]. [address]  -Virtual Online Classes (a variety of topics): see seniorplanet.org or call [phone]  -consider volunteering at a school, hospice center, church, senior center or elsewhere    ADVANCED HEALTHCARE DIRECTIVES:  Everyone should have advanced health care directives in place. This is so that you get the care you want, should you ever be in a situation where you are unable to make your own medical decisions.   From the [place] Advanced Directive Website: "[place] are legal documents in which you give written instructions about your health care if, in the future, you cannot speak for yourself.   A health care power of attorney allows you to name a person you trust to make your health care decisions if you cannot make them yourself. A declaration of a desire for a natural death (or living will) is document, which states that you desire not to have your life prolonged by extraordinary measures  if you have a terminal or incurable illness or if you are in a vegetative state. An advance instruction for mental health treatment makes a declaration of instructions, information and preferences regarding your mental health treatment. It also states that you are aware that the advance instruction authorizes a mental health treatment provider to act according to your wishes. It may also outline your consent or refusal of mental health treatment. A declaration of an anatomical gift allows anyone over the age of 72 to make a gift by will, organ donor card or other document."   Please see the following website or an elder law attorney for forms, FAQs and for completion of advanced directives: [place] Secretary of [place] ([email or web address])  Or copy and paste the following to your web browser: [email or web address]         Lucretia Kern, DO

## 2022-12-14 NOTE — Progress Notes (Signed)
Assessment/Plan:   1.  Parkinsons Disease  -Increase carbidopa/levodopa 25/100, 2 tablets at 7 AM, 2 tablets at 11 AM, 1 tablet at 4 PM  -continue carbidopa/levodopa 50/200 CR at bed for cramping.  This has helped.  -Discontinue pramipexole to sleepiness  -We discussed that it used to be thought that levodopa would increase risk of melanoma but now it is believed that Parkinsons itself likely increases risk of melanoma. he is to get regular skin checks.  He is seeing Dr. Wilhemina Bonito  -discussed no climbing ladders.  Discussed increasing exercise.  2.  Dysphagia  -MBE in May, 2022 with evidence of mild oropharyngeal dysphagia.  Regular solid as well as mechanical soft solids with thin liquids were recommended.   Subjective:   James Collier was seen today in follow up for Parkinsons disease, diagnosed last visit.  My previous records were reviewed prior to todays visit as well as outside records available to me.  This patient is accompanied in the office by his spouse who supplements the history.  Pramipexole was started last visit.  He states that the medication is making him sleepy but he isn't sure its helping.  We encouraged him to start exercising.  He's doing "some." Last visit, he was complaining about shortness of breath/orthopnea and followed up with primary care who did an echocardiogram which looked unremarkable.  Patient has had no falls since last visit.  No lightheadedness or near syncope.  Current prescribed movement disorder medications: Carbidopa/levodopa 25/100, 1 tablet 3 times per day  Carbidopa/levodopa 50/200 CR at bedtime  Pramipexole, 0.5 mg 3 times per day (started last visit)  PREVIOUS MEDICATIONS: pramipexole (eds)  ALLERGIES:   Allergies  Allergen Reactions   Aloe     rash    CURRENT MEDICATIONS:  Outpatient Encounter Medications as of 12/18/2022  Medication Sig   carbidopa-levodopa (SINEMET CR) 50-200 MG tablet TAKE 1 TABLET BY MOUTH EVERYDAY AT  BEDTIME   carbidopa-levodopa (SINEMET IR) 25-100 MG tablet TAKE 1 TABLET BY MOUTH 3 (THREE) TIMES DAILY. 7AM/11AM/4PM   ibuprofen (ADVIL,MOTRIN) 200 MG tablet Take 200 mg by mouth every 6 (six) hours as needed.   Multiple Vitamins-Minerals (PRESERVISION AREDS 2 PO) Take by mouth.   pramipexole (MIRAPEX) 0.5 MG tablet Take 1 tablet (0.5 mg total) by mouth 3 (three) times daily.   Probiotic Product (PROBIOTIC DAILY PO) Take by mouth.   No facility-administered encounter medications on file as of 12/18/2022.    Objective:   PHYSICAL EXAMINATION:    VITALS:   Vitals:   12/18/22 1050  BP: 116/72  Pulse: 67  SpO2: 98%  Weight: 227 lb 12.8 oz (103.3 kg)  Height: '5\' 10"'$  (1.778 m)     GEN:  The patient appears stated age and is in NAD. HEENT:  Normocephalic, atraumatic.  The mucous membranes are moist. The superficial temporal arteries are without ropiness or tenderness.   Neurological examination:  Orientation: The patient is alert and oriented x3. Cranial nerves: There is good facial symmetry with facial hypomimia. The speech is fluent and clear. Soft palate rises symmetrically and there is no tongue deviation. Hearing is intact to conversational tone. Sensation: Sensation is intact to light touch throughout Motor: Strength is at least antigravity x4.  Movement examination: Tone: There is mild to mod increased tone in the RUE and mild in the LUE (similar to last visit and was taking his medication in the room) Abnormal movements: none even with distraction Coordination:  There is no decremation  with any form of RAMS, including alternating supination and pronation of the forearm, hand opening and closing, finger taps, heel taps and toe taps.  Gait and Station: The patient has no difficulty arising out of a deep-seated chair without the use of the hands. He has decreased arm swing bilaterally  I have reviewed and interpreted the following labs independently    Chemistry       Component Value Date/Time   NA 136 07/03/2022 1054   K 4.5 07/03/2022 1054   CL 100 07/03/2022 1054   CO2 31 07/03/2022 1054   BUN 15 07/03/2022 1054   CREATININE 1.14 07/03/2022 1054      Component Value Date/Time   CALCIUM 10.0 07/03/2022 1054   ALKPHOS 61 07/03/2022 1054   AST 17 07/03/2022 1054   ALT 9 07/03/2022 1054   BILITOT 1.0 07/03/2022 1054       Lab Results  Component Value Date   WBC 7.1 07/03/2022   HGB 15.6 07/03/2022   HCT 45.3 07/03/2022   MCV 90.5 07/03/2022   PLT 237.0 07/03/2022    Lab Results  Component Value Date   TSH 1.24 07/03/2022     Total time spent on today's visit was 31 minutes, including both face-to-face time and nonface-to-face time.  Time included that spent on review of records (prior notes available to me/labs/imaging if pertinent), discussing treatment and goals, answering patient's questions and coordinating care.  Cc:  [name], MD

## 2022-12-18 ENCOUNTER — Ambulatory Visit: Payer: Medicare PPO | Admitting: Neurology

## 2022-12-18 ENCOUNTER — Encounter: Payer: Self-pay | Admitting: Neurology

## 2022-12-18 VITALS — BP 116/72 | HR 67 | Ht 70.0 in | Wt 227.8 lb

## 2022-12-18 DIAGNOSIS — G20A1 Parkinson's disease without dyskinesia, without mention of fluctuations: Secondary | ICD-10-CM

## 2022-12-18 MED ORDER — CARBIDOPA-LEVODOPA 25-100 MG PO TABS: ORAL_TABLET | ORAL | 1 refills | Status: DC

## 2022-12-18 MED ORDER — CARBIDOPA-LEVODOPA ER 50-200 MG PO TBCR: EXTENDED_RELEASE_TABLET | ORAL | 1 refills | Status: DC

## 2022-12-18 MED ORDER — PRAMIPEXOLE DIHYDROCHLORIDE 0.5 MG PO TABS: ORAL_TABLET | ORAL | 0 refills | Status: DC

## 2022-12-18 NOTE — Patient Instructions (Addendum)
Decrease pramipexole to 1 tablet twice per day for a week and then 1 tablet daily for a week and then stop the pramipexole   Take carbidopa/levodopa 25/100, 2 at 7am, 2 at 11am, 1 at 4pm

## 2022-12-26 ENCOUNTER — Other Ambulatory Visit: Payer: Self-pay

## 2022-12-26 ENCOUNTER — Telehealth: Payer: Self-pay | Admitting: Neurology

## 2022-12-26 DIAGNOSIS — G20A1 Parkinson's disease without dyskinesia, without mention of fluctuations: Secondary | ICD-10-CM

## 2022-12-26 MED ORDER — CARBIDOPA-LEVODOPA 25-100 MG PO TABS: ORAL_TABLET | ORAL | 1 refills | Status: DC

## 2022-12-26 NOTE — Telephone Encounter (Signed)
Patein feels stiffer and more weak patient wanting to know if he can start taking additional Carbidopa levodopa

## 2022-12-26 NOTE — Telephone Encounter (Signed)
Called patient he agreed to change of meds  and changed in chart

## 2022-12-26 NOTE — Telephone Encounter (Signed)
Pt called in wanting to speak with someone about the pramipexole. He is weaning himself off of it, but he's really noticing the shaking coming back.

## 2022-12-27 NOTE — Telephone Encounter (Signed)
I did speak to patient yesterday about changing the dose of his levodopa to 2 pills tid he agreed to this do you want him to continue to decrease the pramipexole or should he D/C

## 2023-02-20 IMAGING — DX DG CHEST 2V
2 series · 2 of 2 positions shown · non-contrast
Comparison: None Available.

CLINICAL DATA: Worsening cough.

EXAM:
CHEST - 2 VIEW

[chest pa]
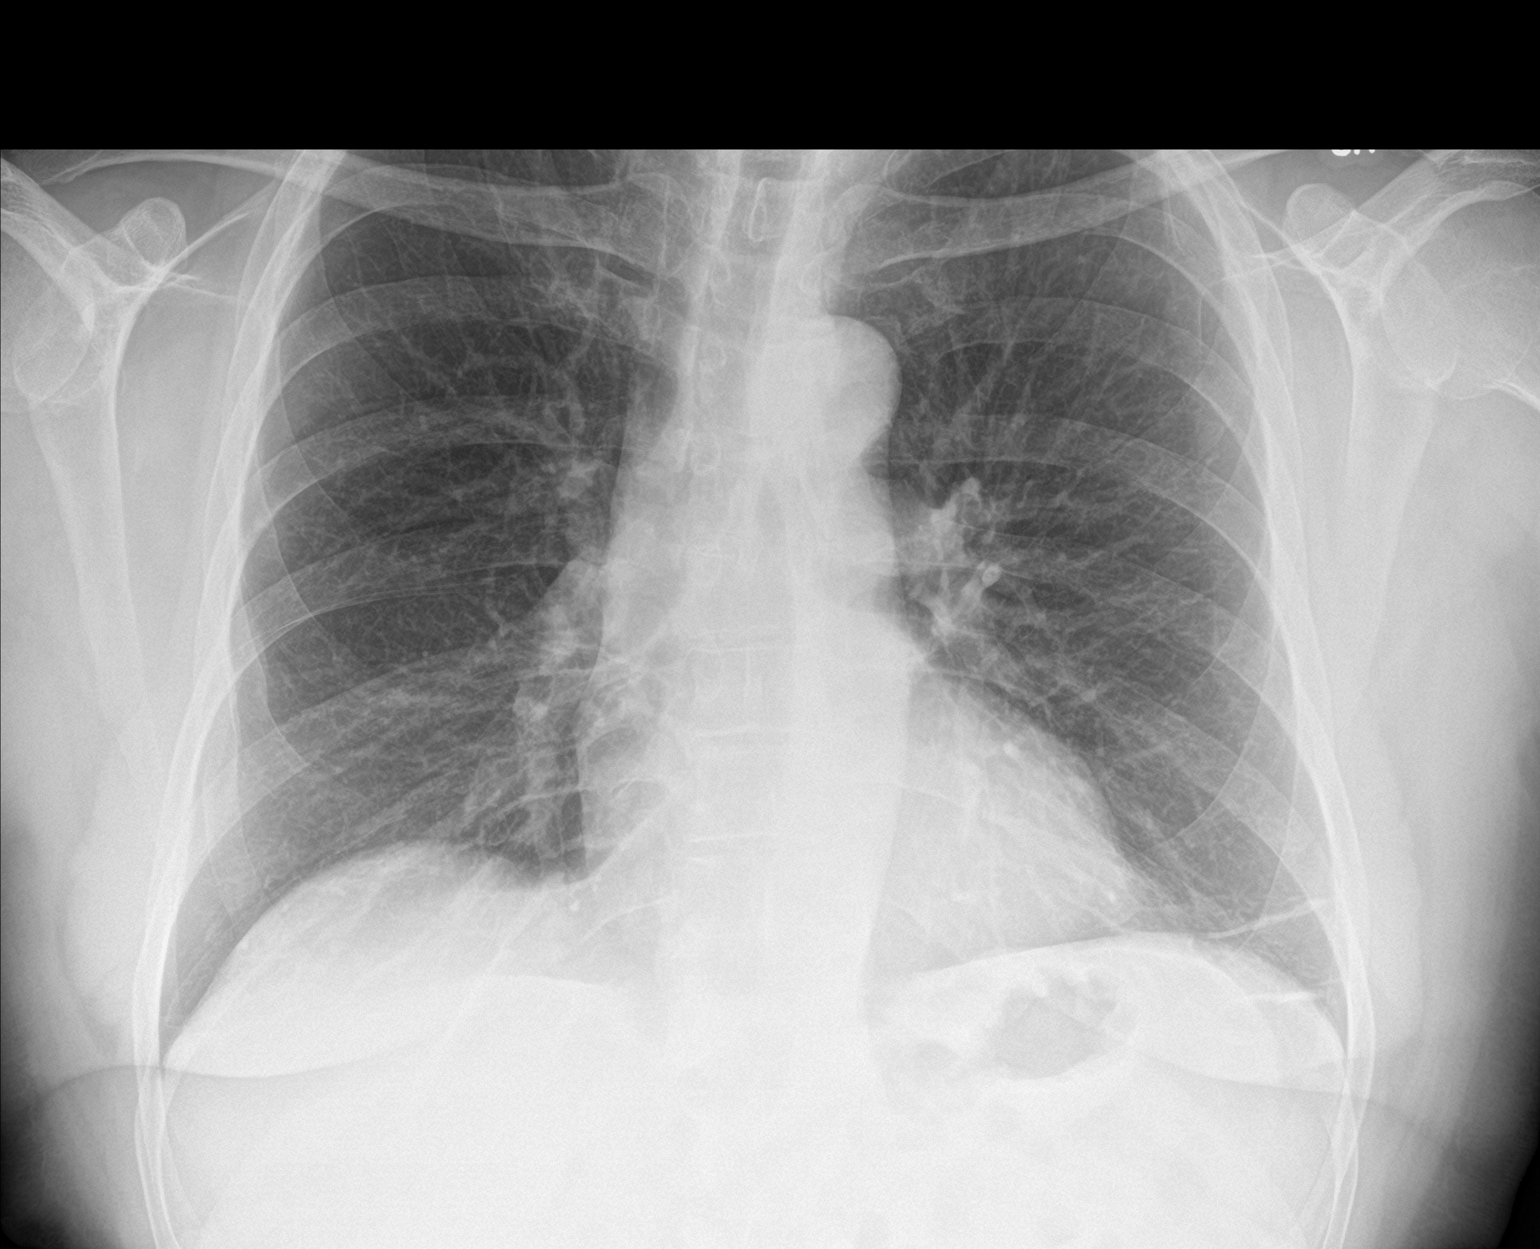

[chest lat]
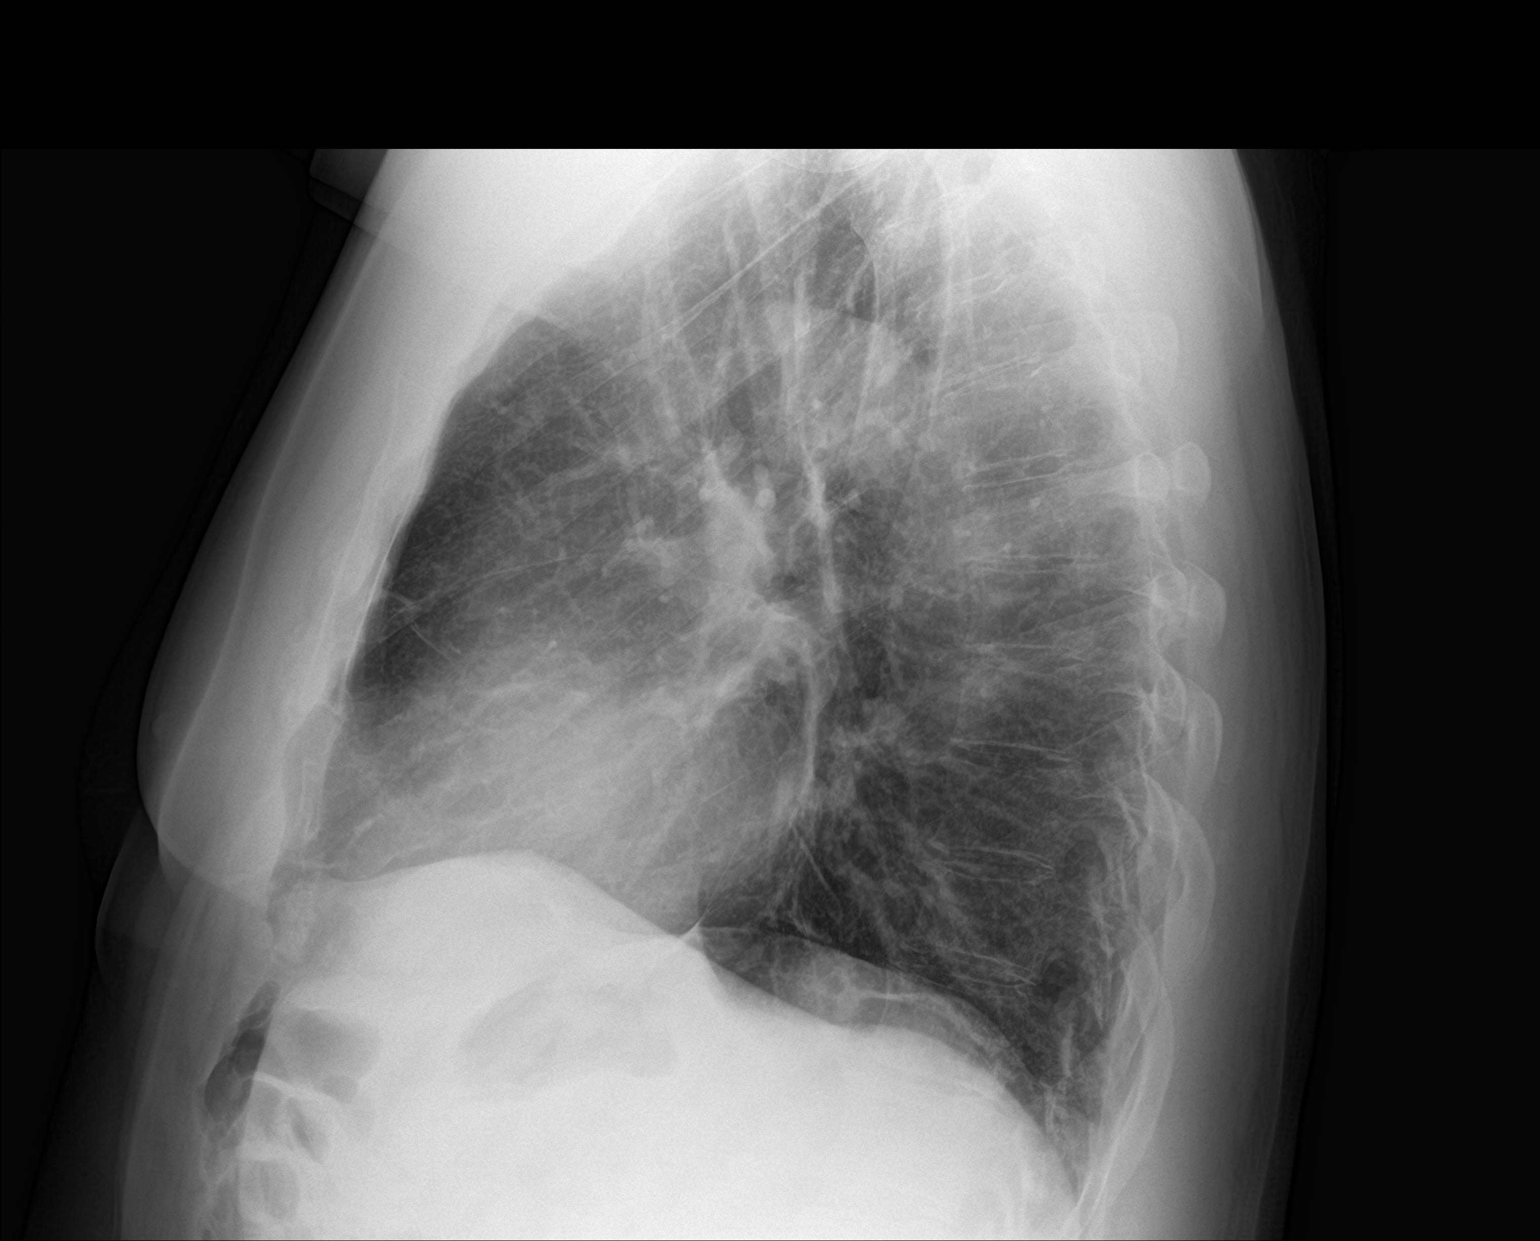

[2 of 2 positions shown; findings below may reference images not displayed]

FINDINGS: No pneumothorax. Linear opacities in the left base are consistent
with atelectasis or scar. No suspicious infiltrate. The heart, hila,
mediastinum, lungs, and pleura are otherwise unremarkable.
IMPRESSION: Scar or atelectasis in the left base.  No other abnormalities.

## 2023-03-11 ENCOUNTER — Telehealth: Payer: Self-pay | Admitting: Neurology

## 2023-03-11 ENCOUNTER — Other Ambulatory Visit: Payer: Self-pay

## 2023-03-11 DIAGNOSIS — G20A1 Parkinson's disease without dyskinesia, without mention of fluctuations: Secondary | ICD-10-CM

## 2023-03-11 MED ORDER — CARBIDOPA-LEVODOPA 25-100 MG PO TABS: ORAL_TABLET | ORAL | 0 refills | Status: DC

## 2023-03-11 NOTE — Telephone Encounter (Signed)
Called patient and sent in prescription.  

## 2023-03-11 NOTE — Telephone Encounter (Signed)
Left message with after hour service on 03-11-23 at 12:15 pm   Caller states he needs to talk with the Dr about a RX  he's states he had a change in pills and thinks he was supposed to take 5 pills a day but he was taking 6 pills a day and now he is running short  he needs to increase his dosage to 6 pills aday.   They did not get the name of the medication or Drug store

## 2023-03-22 DIAGNOSIS — H524 Presbyopia: Secondary | ICD-10-CM | POA: Diagnosis not present

## 2023-03-22 DIAGNOSIS — H353113 Nonexudative age-related macular degeneration, right eye, advanced atrophic without subfoveal involvement: Secondary | ICD-10-CM | POA: Diagnosis not present

## 2023-03-22 DIAGNOSIS — Z961 Presence of intraocular lens: Secondary | ICD-10-CM | POA: Diagnosis not present

## 2023-03-22 DIAGNOSIS — H353122 Nonexudative age-related macular degeneration, left eye, intermediate dry stage: Secondary | ICD-10-CM | POA: Diagnosis not present

## 2023-03-22 DIAGNOSIS — D3131 Benign neoplasm of right choroid: Secondary | ICD-10-CM | POA: Diagnosis not present

## 2023-03-22 DIAGNOSIS — H02403 Unspecified ptosis of bilateral eyelids: Secondary | ICD-10-CM | POA: Diagnosis not present

## 2023-04-01 DIAGNOSIS — Z961 Presence of intraocular lens: Secondary | ICD-10-CM | POA: Diagnosis not present

## 2023-04-01 DIAGNOSIS — H353132 Nonexudative age-related macular degeneration, bilateral, intermediate dry stage: Secondary | ICD-10-CM | POA: Diagnosis not present

## 2023-04-01 DIAGNOSIS — H43813 Vitreous degeneration, bilateral: Secondary | ICD-10-CM | POA: Diagnosis not present

## 2023-04-01 DIAGNOSIS — D3131 Benign neoplasm of right choroid: Secondary | ICD-10-CM | POA: Diagnosis not present

## 2023-05-08 DIAGNOSIS — L820 Inflamed seborrheic keratosis: Secondary | ICD-10-CM | POA: Diagnosis not present

## 2023-05-08 DIAGNOSIS — L821 Other seborrheic keratosis: Secondary | ICD-10-CM | POA: Diagnosis not present

## 2023-05-08 DIAGNOSIS — L718 Other rosacea: Secondary | ICD-10-CM | POA: Diagnosis not present

## 2023-05-08 DIAGNOSIS — D225 Melanocytic nevi of trunk: Secondary | ICD-10-CM | POA: Diagnosis not present

## 2023-05-09 ENCOUNTER — Ambulatory Visit: Payer: Medicare PPO | Admitting: Rehabilitative and Restorative Service Providers"

## 2023-05-09 ENCOUNTER — Ambulatory Visit: Payer: Medicare PPO | Attending: Neurology | Admitting: Physical Therapy

## 2023-05-09 DIAGNOSIS — R29818 Other symptoms and signs involving the nervous system: Secondary | ICD-10-CM

## 2023-05-09 DIAGNOSIS — R2689 Other abnormalities of gait and mobility: Secondary | ICD-10-CM

## 2023-05-09 NOTE — Therapy (Incomplete)
[facility] [address] [place], [place], [address] Phone: [phone]   Fax:  [phone]  Patient Details  Name: James Collier MRN: [id number] Date of Birth: [birth year] Referring Provider:  Kristian Covey, MD  Encounter Date: 05/09/2023  Physical Therapy Parkinson's Disease Screen  09/06/22 screen:  TUG 10.81, 3.36 ft/sec,  Timed Up and Go test:***  10 meter walk test:***  5 time sit to stand test:***  Patient would benefit from Physical Therapy evaluation due to ***  Patient does not require Physical Therapy services at this time.  Recommend Physical Therapy screen in ***    Duston Smolenski W., PT 05/09/2023, 7:56 AM  [place]  [facility] [address], [address] [place], [place], [address] Phone: [phone]   Fax:  [phone]

## 2023-05-09 NOTE — Therapy (Addendum)
[facility] [place] [facility] [address], [address] [place], [place], [address] Phone: [phone]   Fax:  [phone]  Patient Details  Name: James Collier MRN: [id number] Date of Birth: 1956/05/18 Referring Provider:  Kerin Salen, DO  Encounter Date: 05/09/2023  SCREENING FOR MOVEMENT DISORDERS CLINIC:  Physical therapy measurements:  Gait speed=3.82 ft/sec TUG=10.25 seconds (7.1 on 05/10/21) 5 time sit to stand=15.75 seconds  Discussed asking about PT for neck stiffness and pain  "If there is anything that we can help immediately, it would be my neck."    If you answer "yes" to any of these questions you may benefit from therapy, particularly if this has changed in the last 3-6 months.    If you answer "Yes" to the following, you may benefit from occupational therapy.  Do you have difficulty cutting your food or do you spill food off your spoon or fork when eating? Yes___   No_X_          Do you have difficulty brushing your teeth, brushing your hair, or shaving? Yes___   No_X__          Do you have difficulty getting dressed (fastening buttons, tying your shoes, putting on your socks, pulling a shirt down in the back)? Yes___   No_ x__          Do you have difficulty getting in or out of the shower? Yes___   No__x_          Does your writing get small or is it hard to read?   Yes__x_   No___           Do you have increased difficulty with cooking, cleaning, or shopping?  (reaching for items in cabinets, carrying pots and pans, carrying laundry basket, carrying groceries, etc.)  Yes___   No_x__          Do you have shoulder pain? "I hurt everywhere. Back, knees, hips. Nothing has changed except neck is worse than it was."  Yes_x__   No___           Is it taking you longer to get ready for the day than it did 6 months ago? Yes___   No_x__  If you answer "Yes" to the following, you may benefit from physical therapy.   Do you  have falls?     Yes___   No__x_           Do you feel unsteady while walking or notice changes in your balance?       Yes___   No__x_           Do you have difficulty getting started walking?   Yes___   No__x_           Do you experience "freezing" where you feel like your feet are stuck to the ground?     Yes___   No__x_            If you answer "Yes" to the following, you may benefit from speech therapy.    Do others tell you that they have difficulty hearing you?      Yes___   No__x_          Do you have difficulty swallowing food and/or liquids?             Yes___   No_x__          Do you have excess saliva?      Yes___  No_x__            During meals, do you cough or clear throat excessively?        Yes_x__   No__            ADDENDED ON 05/31/23: called patient and recommended he f/u with primary care to have neck evaluated. Discussed Dr. Arbutus Leas recommended this when I reached out via epic.  He notes he will f/u as needed.  Krystofer Hevener, PT 05/09/2023, 12:02 PM  [place] [facility] [address], [address] [place], [place], [address] Phone: [phone]   Fax:  [phone]

## 2023-06-07 ENCOUNTER — Other Ambulatory Visit: Payer: Self-pay | Admitting: Neurology

## 2023-06-07 DIAGNOSIS — G20A1 Parkinson's disease without dyskinesia, without mention of fluctuations: Secondary | ICD-10-CM

## 2023-06-17 NOTE — Progress Notes (Unsigned)
Assessment/Plan:   1.  Parkinsons Disease  -continue carbidopa/levodopa 25/100, 2 tablets at 7 AM, 2 tablets at 11 AM, 2 tablet at 4 PM  -continue carbidopa/levodopa 50/200 CR at bed for cramping.  This has helped.  -discussed entacapone but we decided to hold on that for now  -pramipexole d/c due to sleepiness   -discussed Parkinsons Disease GeneRation study  -We discussed that it used to be thought that levodopa would increase risk of melanoma but now it is believed that Parkinsons itself likely increases risk of melanoma. he is to get regular skin checks.  He is seeing Dr. Karlyn Agee  -Long discussion again about increasing exercise and types of exercise.  Multiple questions asked and answered those to the best of my ability today.  2.  Dysphagia  -MBE in May, 2022 with evidence of mild oropharyngeal dysphagia.  Regular solid as well as mechanical soft solids with thin liquids were recommended.  3.  Lightheadedness  -start taking BP in various positions at different times of days and send me a log  -increase water intake  4.  Urinary frequency  -needs to f/u with urology  5.  Fatigue  -we discussed that fatigue is the number one treatment resistant complaint of Parkinsons Disease patients.  No matter what we do with the Parkinsons Disease medications (including d/c them), patients tend to c/o fatigue, unfortunately.    6.  Orthopnea/chest discomfort  -told him this was NOT associated with Parkinsons Disease and is very concerning and he needs to f/u with primary care for further workup  7.  Constipation  -discussed nature and pathophysiology and association with PD  -discussed importance of hydration.  Pt is to increase water intake  -pt is given a copy of the rancho recipe  -recommended daily colace  -recommended miralax prn  8.  Follow-up in 6 months, sooner should new neurologic issues arise. Subjective:   James Collier was seen today in follow up for Parkinsons  disease, diagnosed last visit.  My previous records were reviewed prior to todays visit as well as outside records available to me.  This patient is accompanied in the office by his spouse who supplements the history.  We increased his levodopa to last visit and ended up stopping his pramipexole, as he did not think it was helping and he said it caused sleepiness.  He ended up calling us about a week later stating that he was more stiff with going off the pramipexole and wanted to increase the levodopa further.  We told him he could take 2 tablets 3 times per day, but nothing higher.  He is not as sleepy with the d/c of pramipexole but he is fatigued.  He is having lightheadedness with prolonged standing.  Had 1 episode where he was outside with a contractor and it was hot but he was in the shade and he became lightheaded.  EMS was contacted.  EKG was done and it was negative.  He did not pass out.  He has cramping of the feet at random times and not sure if associated with wearing off.  Having urinary frequency - hasn't seen urology in years.    Current prescribed movement disorder medications: Carbidopa/levodopa 25/100, 2/2/2 (increased last visit) Carbidopa/levodopa 50/200 CR at bedtime    PREVIOUS MEDICATIONS: pramipexole (eds)  ALLERGIES:   Allergies  Allergen Reactions   Aloe     rash    CURRENT MEDICATIONS:  Outpatient Encounter Medications as of 06/19/2023  Medication Sig   carbidopa-levodopa (SINEMET CR) 50-200 MG tablet TAKE 1 TABLET BY MOUTH EVERYDAY AT BEDTIME   carbidopa-levodopa (SINEMET IR) 25-100 MG tablet 2 AT 7AM, 2 AT 11AM, 2 AT 4PM   ibuprofen (ADVIL,MOTRIN) 200 MG tablet Take 200 mg by mouth every 6 (six) hours as needed.   Multiple Vitamins-Minerals (PRESERVISION AREDS 2 PO) Take by mouth.   Probiotic Product (PROBIOTIC DAILY PO) Take by mouth.   [DISCONTINUED] pramipexole (MIRAPEX) 0.5 MG tablet 1 po bid x 1 week, then 1 po qd x 1 week (Patient not taking: Reported on  06/19/2023)   No facility-administered encounter medications on file as of 06/19/2023.    Objective:   PHYSICAL EXAMINATION:    VITALS:   Vitals:   06/19/23 0940  BP: 131/77  Pulse: 68  SpO2: 96%  Weight: 225 lb (102.1 kg)  Height: 5\' 10"  (1.778 m)      GEN:  The patient appears stated age and is in NAD. HEENT:  Normocephalic, atraumatic.  The mucous membranes are moist. The superficial temporal arteries are without ropiness or tenderness. CV:  RRR Lungs:  CTAB Neck:  no bruit   Neurological examination:  Orientation: The patient is alert and oriented x3. Cranial nerves: There is good facial symmetry with facial hypomimia. The speech is fluent and clear. Soft palate rises symmetrically and there is no tongue deviation. Hearing is intact to conversational tone. Sensation: Sensation is intact to light touch throughout Motor: Strength is at least antigravity x4.  Movement examination: Tone: There is mild increased tone in the bilateral UE Abnormal movements: none even with distraction Coordination:  There is mild decremation on the L Gait and Station: The patient has no difficulty arising out of a deep-seated chair without the use of the hands. He has decreased arm swing bilaterally.  He is not short stepped  I have reviewed and interpreted the following labs independently    Chemistry      Component Value Date/Time   NA 136 07/03/2022 1054   K 4.5 07/03/2022 1054   CL 100 07/03/2022 1054   CO2 31 07/03/2022 1054   BUN 15 07/03/2022 1054   CREATININE 1.14 07/03/2022 1054      Component Value Date/Time   CALCIUM 10.0 07/03/2022 1054   ALKPHOS 61 07/03/2022 1054   AST 17 07/03/2022 1054   ALT 9 07/03/2022 1054   BILITOT 1.0 07/03/2022 1054       Lab Results  Component Value Date   WBC 7.1 07/03/2022   HGB 15.6 07/03/2022   HCT 45.3 07/03/2022   MCV 90.5 07/03/2022   PLT 237.0 07/03/2022    Lab Results  Component Value Date   TSH 1.24 07/03/2022      Total time spent on today's visit was 50 minutes, including both face-to-face time and nonface-to-face time.  Time included that spent on review of records (prior notes available to me/labs/imaging if pertinent), discussing treatment and goals, answering patient's questions and coordinating care.  Cc:  Kristian Covey, MD

## 2023-06-19 ENCOUNTER — Encounter: Payer: Self-pay | Admitting: Neurology

## 2023-06-19 ENCOUNTER — Ambulatory Visit: Payer: Medicare PPO | Admitting: Neurology

## 2023-06-19 VITALS — BP 131/77 | HR 68 | Ht 70.0 in | Wt 225.0 lb

## 2023-06-19 DIAGNOSIS — R42 Dizziness and giddiness: Secondary | ICD-10-CM

## 2023-06-19 DIAGNOSIS — R5383 Other fatigue: Secondary | ICD-10-CM

## 2023-06-19 DIAGNOSIS — R0601 Orthopnea: Secondary | ICD-10-CM | POA: Diagnosis not present

## 2023-06-19 DIAGNOSIS — G20A1 Parkinson's disease without dyskinesia, without mention of fluctuations: Secondary | ICD-10-CM

## 2023-06-19 NOTE — Patient Instructions (Addendum)
SAVE THE DATE!  We are planning a Parkinsons Disease educational symposium at [facility] in [place] on October 11.  More details to come!  If you would like to be added to our email list to get further information, email [email or web address].  We hope to see you there!   Constipation and Parkinson's disease:  1.Rancho recipe for constipation in Parkinsons Disease:  -1 cup of unprocessed bran (need to get this at [employer], [employer] or similar type of store), 2 cups of applesauce in 1 cup of prune juice 2.  Increase fiber intake (Metamucil,vegetables) 3.  Regular, moderate exercise can be beneficial. 4.  Avoid medications causing constipation, such as medications like antacids with calcium or magnesium 5.  It's okay to take daily Miralax, and taper if stools become too loose or you experience diarrhea 6.  Stool softeners (Colace) can help with chronic constipation and I recommend you take this daily. 7.  Increase water intake.  You should be drinking 1/2 gallon of water a day as long as you have not been diagnosed with congestive heart failure or renal/kidney failure.  This is probably the single greatest thing that you can do to help your constipation.  SAVE THE DATE!  We are planning a Parkinsons Disease educational symposium at [facility] in [place] on October 11.  More details to come!  If you would like to be added to our email list to get further information, email [email or web address].  We hope to see you there!

## 2023-07-11 ENCOUNTER — Encounter (INDEPENDENT_AMBULATORY_CARE_PROVIDER_SITE_OTHER): Payer: Self-pay

## 2023-07-12 DIAGNOSIS — M170 Bilateral primary osteoarthritis of knee: Secondary | ICD-10-CM | POA: Diagnosis not present

## 2023-07-30 ENCOUNTER — Telehealth: Payer: Self-pay | Admitting: Family Medicine

## 2023-07-30 ENCOUNTER — Encounter: Payer: Self-pay | Admitting: Family Medicine

## 2023-07-30 ENCOUNTER — Ambulatory Visit (INDEPENDENT_AMBULATORY_CARE_PROVIDER_SITE_OTHER): Payer: Medicare PPO | Admitting: Family Medicine

## 2023-07-30 VITALS — BP 110/70 | HR 75 | Temp 98.0°F | Ht 70.08 in | Wt 220.2 lb

## 2023-07-30 DIAGNOSIS — Z0000 Encounter for general adult medical examination without abnormal findings: Secondary | ICD-10-CM

## 2023-07-30 DIAGNOSIS — Z23 Encounter for immunization: Secondary | ICD-10-CM

## 2023-07-30 DIAGNOSIS — R5383 Other fatigue: Secondary | ICD-10-CM | POA: Diagnosis not present

## 2023-07-30 DIAGNOSIS — Z8546 Personal history of malignant neoplasm of prostate: Secondary | ICD-10-CM | POA: Diagnosis not present

## 2023-07-30 DIAGNOSIS — M542 Cervicalgia: Secondary | ICD-10-CM

## 2023-07-30 LAB — BASIC METABOLIC PANEL
BUN: 13 mg/dL (ref 6–23)
CO2: 31 meq/L (ref 19–32)
Calcium: 10.1 mg/dL (ref 8.4–10.5)
Chloride: 98 meq/L (ref 96–112)
Creatinine, Ser: 1.08 mg/dL (ref 0.40–1.50)
GFR: 70.9 mL/min (ref >60.00)
Glucose, Bld: 92 mg/dL (ref 70–99)
Potassium: 4.4 meq/L (ref 3.5–5.1)
Sodium: 138 meq/L (ref 135–145)

## 2023-07-30 LAB — LIPID PANEL
Cholesterol: 186 mg/dL (ref 0–200)
HDL: 35.6 mg/dL — ABNORMAL LOW (ref >39.00)
LDL Cholesterol: 105 mg/dL — ABNORMAL HIGH (ref 0–99)
NonHDL: 149.96
Total CHOL/HDL Ratio: 5
Triglycerides: 227 mg/dL — ABNORMAL HIGH (ref 0.0–149.0)
VLDL: 45.4 mg/dL — ABNORMAL HIGH (ref 0.0–40.0)

## 2023-07-30 LAB — CBC WITH DIFFERENTIAL/PLATELET
Basophils Absolute: 0 10*3/uL (ref 0.0–0.1)
Basophils Relative: 0.3 % (ref 0.0–3.0)
Eosinophils Absolute: 0.1 10*3/uL (ref 0.0–0.7)
Eosinophils Relative: 0.9 % (ref 0.0–5.0)
HCT: 47.3 % (ref 39.0–52.0)
Hemoglobin: 15.8 g/dL (ref 13.0–17.0)
Lymphocytes Relative: 25 % (ref 12.0–46.0)
Lymphs Abs: 1.9 10*3/uL (ref 0.7–4.0)
MCHC: 33.4 g/dL (ref 30.0–36.0)
MCV: 92.9 fl (ref 78.0–100.0)
Monocytes Absolute: 0.6 10*3/uL (ref 0.1–1.0)
Monocytes Relative: 8.2 % (ref 3.0–12.0)
Neutro Abs: 4.9 10*3/uL (ref 1.4–7.7)
Neutrophils Relative %: 65.6 % (ref 43.0–77.0)
Platelets: 249 10*3/uL (ref 150.0–400.0)
RBC: 5.09 Mil/uL (ref 4.22–5.81)
RDW: 13.2 % (ref 11.5–15.5)
WBC: 7.5 10*3/uL (ref 4.0–10.5)

## 2023-07-30 LAB — HEPATIC FUNCTION PANEL
ALT: 7 U/L (ref 0–53)
AST: 16 U/L (ref 0–37)
Albumin: 4.3 g/dL (ref 3.5–5.2)
Alkaline Phosphatase: 71 U/L (ref 39–117)
Bilirubin, Direct: 0.2 mg/dL (ref 0.0–0.3)
Total Bilirubin: 1.1 mg/dL (ref 0.2–1.2)
Total Protein: 7.2 g/dL (ref 6.0–8.3)

## 2023-07-30 NOTE — Telephone Encounter (Signed)
Orders placed.

## 2023-07-30 NOTE — Patient Instructions (Addendum)
Let me know if you decide to pursue sleep study.   Avoid eating within 3 hours of bedtime  -Consider elevate head of bed 4 to 6 inches  -Consider trial of over-the-counter Nexium or Prilosec 20 mg daily

## 2023-07-30 NOTE — Telephone Encounter (Signed)
Pt states he is having trouble with his neck and would like physical therapy done.  He states he saw Dr. Caryl Never today but didn't have time to mention it.  He has PT orders already so would like his neck to be added to it.  Pt states he is having pain, stiffness and it pops alot.

## 2023-07-30 NOTE — Progress Notes (Signed)
Established Patient Office Visit  Subjective   Patient ID: James Collier, male    DOB: Jun 06, 1956  Age: 67 y.o. MRN: [id number]  Chief Complaint  Patient presents with   Annual Exam    HPI   James Collier is here for physical exam.  He has history of Parkinson's disease and prostate cancer.  Followed regularly by neurology.  Remains on Sinemet.  Only occasional dizziness.  He has noted this mostly when being out in the heat.  She has some struggles with losing weight.  Has all but eliminated alcohol.  Still drinks white grape juice twice per day.  He actually has lost a few pounds since last year.  Has decreased stamina which limits his physical activity.  Also complaining of some urine frequency especially early mornings.  Usually drinks 2 cups of coffee.  Only gets up once at night.  No obstructive symptoms.  No polydipsia.  No history of diabetes.  He also relates that at night when he lays down on his back or left side he tends to have increased phlegm and congestion.  Occasional cough.  No observed apnea episodes.  No obvious reflux symptoms.  Health maintenance reviewed:  -Shingles vaccine completed -Pneumonia vaccine completed -Colonoscopy due 2027 -Needs flu vaccine and also tetanus due this year  Family history significant for his mother having pancreatic cancer.  Father has psoriatic arthritis.  He had an aunt with esophageal cancer. No siblings.    Social history-married.  Has a grown son.  Patient is non-smoker.  No regular alcohol.  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 He has never used smokeless tobacco. He reports current alcohol use of about 3.0  standard drinks of alcohol per week. He reports that he does not use drugs. family history includes Arthritis in his father; Cancer in his maternal aunt, maternal grandmother, mother, and paternal grandmother; Ovarian cancer in his paternal grandmother; Parkinson's disease in his father. Allergies  Allergen Reactions   Aloe     rash    Review of Systems  Constitutional:  Positive for malaise/fatigue. Negative for chills, fever and weight loss.  HENT:  Negative for hearing loss.   Eyes:  Negative for blurred vision and double vision.  Respiratory:  Negative for shortness of breath.        See HPI  Cardiovascular:  Negative for chest pain, palpitations and leg swelling.  Gastrointestinal:  Negative for abdominal pain, blood in stool, constipation and diarrhea.  Genitourinary:  Negative for dysuria.  Skin:  Negative for rash.  Neurological:  Negative for speech change, seizures, loss of consciousness and headaches.  Psychiatric/Behavioral:  Negative for depression.       Objective:     BP 110/70 (BP Location: Left Arm, Patient Position: Sitting, Cuff Size: Normal)   Pulse 75   Temp 98 F (36.7 C) (Oral)   Ht 5' 10.08" (1.78 m)   Wt 220 lb 3.2 oz (99.9 kg)   SpO2 98%   BMI 31.52 kg/m  BP Readings from Last 3 Encounters:  07/30/23 110/70  06/19/23 131/77  12/18/22  116/72   Wt Readings from Last 3 Encounters:  07/30/23 220 lb 3.2 oz (99.9 kg)  06/19/23 225 lb (102.1 kg)  12/18/22 227 lb 12.8 oz (103.3 kg)      Physical Exam Vitals reviewed.  Constitutional:      General: He is not in acute distress.    Appearance: He is well-developed. He is not ill-appearing.  HENT:     Head: Normocephalic and atraumatic.     Right Ear: External ear normal.     Left Ear: External ear normal.     Ears:     Comments: Mild erythema both TMs Eyes:     Conjunctiva/sclera: Conjunctivae normal.     Pupils: Pupils are equal, round, and reactive to light.  Neck:     Thyroid: No  thyromegaly.  Cardiovascular:     Rate and Rhythm: Normal rate and regular rhythm.     Heart sounds: Normal heart sounds. No murmur heard. Pulmonary:     Effort: No respiratory distress.     Breath sounds: No wheezing or rales.  Abdominal:     General: Bowel sounds are normal. There is no distension.     Palpations: Abdomen is soft. There is no mass.     Tenderness: There is no abdominal tenderness. There is no guarding or rebound.  Musculoskeletal:     Cervical back: Normal range of motion and neck supple.  Lymphadenopathy:     Cervical: No cervical adenopathy.  Skin:    Comments: He has some darker pigmentation lower legs especially medially consistent with venous stasis changes.  No ulceration.  Neurological:     Mental Status: He is alert and oriented to person, place, and time.     Cranial Nerves: No cranial nerve deficit.      No results found for any visits on 07/30/23.  Last CBC Lab Results  Component Value Date   WBC 7.1 07/03/2022   HGB 15.6 07/03/2022   HCT 45.3 07/03/2022   MCV 90.5 07/03/2022   MCH 31.9 05/24/2012   RDW 13.5 07/03/2022   PLT 237.0 07/03/2022   Last metabolic panel Lab Results  Component Value Date   GLUCOSE 97 07/03/2022   NA 136 07/03/2022   K 4.5 07/03/2022   CL 100 07/03/2022   CO2 31 07/03/2022   BUN 15 07/03/2022   CREATININE 1.14 07/03/2022   GFR 66.95 07/03/2022   CALCIUM 10.0 07/03/2022   PROT 7.3 07/03/2022   ALBUMIN 4.6 07/03/2022   BILITOT 1.0 07/03/2022   ALKPHOS 61 07/03/2022   AST 17 07/03/2022   ALT 9 07/03/2022   Last lipids Lab Results  Component Value Date   CHOL 209 (H) 07/03/2022   HDL 39.30 07/03/2022   LDLCALC 105 (H) 05/24/2020   LDLDIRECT 133.0 07/03/2022   TRIG 229.0 (H) 07/03/2022   CHOLHDL 5 07/03/2022   Last thyroid functions Lab Results  Component Value Date   TSH 1.24 07/03/2022      The 10-year ASCVD risk score (Arnett DK, et al., 2019) is: 13.3%    Assessment & Plan:   Problem  List Items Addressed This Visit       Unprioritized   History of prostate cancer   Relevant Orders   PSA   Other Visit Diagnoses     Physical exam    -  Primary   Relevant Orders   Lipid panel   Basic metabolic panel   CBC with Differential/Platelet   Hepatic function panel   Fatigue, unspecified  type       Relevant Orders   TSH     67 year old male with history of prostate cancer and Parkinson disease.  He relates fatigue and decreased stamina.  Has gained some weight in recent years and struggling to lose this.  He had some urine frequency without obstructive symptoms and no recent burning with urination.  We discussed the following issues  -Recommend flu vaccine and patient consents.  This was given -Discussed tetanus booster at some point he will check on insurance coverage -With his increased fatigue issues consider rule out obstructive sleep apnea.  He declines referral at this time but will consider -He is having some urine frequency we discussed reducing caffeine as a first step.  Definitely need to avoid anticholinergics with his Parkinson's disease.  If symptoms remain could consider trial of Myrbetriq or Gemtesa -Obtain follow-up labs.  Patient requesting PSA with his past history of prostate cancer although his PSAs have been 0 over the past 10 years. -Regarding nighttime symptoms of cough and congestion consider elevate head of bed 4 to 6 inches.  Avoid eating within 3 hours of bedtime.  Consider trial of over-the-counter PPI such as Nexium or Prilosec.  No follow-ups on file.    Evelena Peat, MD

## 2023-07-30 NOTE — Addendum Note (Signed)
Addended by: Christy Sartorius on: 07/30/2023 10:05 AM   Modules accepted: Orders

## 2023-07-31 LAB — PSA: PSA: 0 ng/mL — ABNORMAL LOW (ref 0.10–4.00)

## 2023-07-31 LAB — TSH: TSH: 1.66 u[IU]/mL (ref 0.35–5.50)

## 2023-08-02 DIAGNOSIS — M25562 Pain in left knee: Secondary | ICD-10-CM | POA: Diagnosis not present

## 2023-08-02 DIAGNOSIS — M25561 Pain in right knee: Secondary | ICD-10-CM | POA: Diagnosis not present

## 2023-08-06 DIAGNOSIS — D3131 Benign neoplasm of right choroid: Secondary | ICD-10-CM | POA: Diagnosis not present

## 2023-08-06 DIAGNOSIS — H353132 Nonexudative age-related macular degeneration, bilateral, intermediate dry stage: Secondary | ICD-10-CM | POA: Diagnosis not present

## 2023-08-06 DIAGNOSIS — H43823 Vitreomacular adhesion, bilateral: Secondary | ICD-10-CM | POA: Diagnosis not present

## 2023-08-06 DIAGNOSIS — Z961 Presence of intraocular lens: Secondary | ICD-10-CM | POA: Diagnosis not present

## 2023-08-06 DIAGNOSIS — M25561 Pain in right knee: Secondary | ICD-10-CM | POA: Diagnosis not present

## 2023-08-06 DIAGNOSIS — M25562 Pain in left knee: Secondary | ICD-10-CM | POA: Diagnosis not present

## 2023-08-13 DIAGNOSIS — M170 Bilateral primary osteoarthritis of knee: Secondary | ICD-10-CM | POA: Diagnosis not present

## 2023-08-16 DIAGNOSIS — M25561 Pain in right knee: Secondary | ICD-10-CM | POA: Diagnosis not present

## 2023-08-16 DIAGNOSIS — M25562 Pain in left knee: Secondary | ICD-10-CM | POA: Diagnosis not present

## 2023-08-19 DIAGNOSIS — M25562 Pain in left knee: Secondary | ICD-10-CM | POA: Diagnosis not present

## 2023-08-19 DIAGNOSIS — M25561 Pain in right knee: Secondary | ICD-10-CM | POA: Diagnosis not present

## 2023-08-20 DIAGNOSIS — M170 Bilateral primary osteoarthritis of knee: Secondary | ICD-10-CM | POA: Diagnosis not present

## 2023-08-26 DIAGNOSIS — M25561 Pain in right knee: Secondary | ICD-10-CM | POA: Diagnosis not present

## 2023-08-26 DIAGNOSIS — M25562 Pain in left knee: Secondary | ICD-10-CM | POA: Diagnosis not present

## 2023-08-27 DIAGNOSIS — M170 Bilateral primary osteoarthritis of knee: Secondary | ICD-10-CM | POA: Diagnosis not present

## 2023-08-28 ENCOUNTER — Other Ambulatory Visit: Payer: Self-pay | Admitting: Neurology

## 2023-08-28 DIAGNOSIS — G20A1 Parkinson's disease without dyskinesia, without mention of fluctuations: Secondary | ICD-10-CM

## 2023-09-02 DIAGNOSIS — M25562 Pain in left knee: Secondary | ICD-10-CM | POA: Diagnosis not present

## 2023-09-02 DIAGNOSIS — M25561 Pain in right knee: Secondary | ICD-10-CM | POA: Diagnosis not present

## 2023-09-05 DIAGNOSIS — M25561 Pain in right knee: Secondary | ICD-10-CM | POA: Diagnosis not present

## 2023-09-05 DIAGNOSIS — M25562 Pain in left knee: Secondary | ICD-10-CM | POA: Diagnosis not present

## 2023-09-10 DIAGNOSIS — M25562 Pain in left knee: Secondary | ICD-10-CM | POA: Diagnosis not present

## 2023-09-10 DIAGNOSIS — M25561 Pain in right knee: Secondary | ICD-10-CM | POA: Diagnosis not present

## 2023-09-13 DIAGNOSIS — M25562 Pain in left knee: Secondary | ICD-10-CM | POA: Diagnosis not present

## 2023-09-13 DIAGNOSIS — M25561 Pain in right knee: Secondary | ICD-10-CM | POA: Diagnosis not present

## 2023-09-16 ENCOUNTER — Telehealth: Payer: Self-pay | Admitting: Neurology

## 2023-09-16 NOTE — Telephone Encounter (Signed)
-  start taking BP in various positions at different times of days and send me a log   Fatigue             -we discussed that fatigue is the number one treatment resistant complaint of Parkinsons Disease patients.  No matter what we do with the Parkinsons Disease medications (including d/c them), patients tend to c/o fatigue, unfortunately.

## 2023-09-16 NOTE — Telephone Encounter (Signed)
Patient called stating that Carbiddopa-Levodopa 25-100mg  . Patient states the medication isnt lasting long enojugh, by 3:00pm-3:30Pm he is barely functional

## 2023-09-16 NOTE — Telephone Encounter (Signed)
Pt called in and left a message with the access nurse. Pt states he feels more weak and tired in the late afternoon. Symptoms have been ongoing for 2 months and appears to be getting worse. Patient has parkinson's and takes carbidopa.

## 2023-09-16 NOTE — Telephone Encounter (Signed)
Called patient and left voicemail. I need to know the dose time and amount patient is taking

## 2023-09-16 NOTE — Telephone Encounter (Signed)
Patient taking 2 tablets at 7 AM, 2 tablets at 11 AM, 2 tablet at 4 PM  Patient is feeling very tired and not having off periods around 2-3:30

## 2023-09-17 NOTE — Telephone Encounter (Signed)
Called patient and let him know of the recommendations of Dr. Arbutus Leas

## 2023-09-25 ENCOUNTER — Encounter: Payer: Self-pay | Admitting: Family Medicine

## 2023-09-25 ENCOUNTER — Telehealth: Payer: Medicare PPO | Admitting: Family Medicine

## 2023-09-25 VITALS — HR 73

## 2023-09-25 DIAGNOSIS — M25562 Pain in left knee: Secondary | ICD-10-CM | POA: Diagnosis not present

## 2023-09-25 DIAGNOSIS — R062 Wheezing: Secondary | ICD-10-CM | POA: Diagnosis not present

## 2023-09-25 DIAGNOSIS — R051 Acute cough: Secondary | ICD-10-CM

## 2023-09-25 DIAGNOSIS — M25561 Pain in right knee: Secondary | ICD-10-CM | POA: Diagnosis not present

## 2023-09-25 MED ORDER — PREDNISONE 20 MG PO TABS: ORAL_TABLET | ORAL | 0 refills | Status: DC

## 2023-09-25 MED ORDER — ALBUTEROL SULFATE HFA 108 (90 BASE) MCG/ACT IN AERS: 2.0000 | INHALATION_SPRAY | RESPIRATORY_TRACT | 1 refills | Status: DC | PRN

## 2023-09-25 NOTE — Progress Notes (Signed)
Patient ID: James Collier, male   DOB: 1956-10-17, 67 y.o.   MRN: [id number]   Virtual Visit via Video Note  I connected with [name] on 09/25/23 at  5:00 PM EDT by a video enabled telemedicine application and verified that I am speaking with the correct person using two identifiers.  Location patient: home Location provider:work or home office Persons participating in the virtual visit: patient, provider  I discussed the limitations of evaluation and management by telemedicine and the availability of in person appointments. The patient expressed understanding and agreed to proceed.   HPI:  [place] called with chief complaint of increased chest congestion for the past couple weeks.  He has some chronic allergies and uses Zyrtec for postnasal drip.  His chest mucus has been mostly clear but thick.  He denies any fever.  No hemoptysis.  No pleuritic pain.  No sick contacts.  No chronic sinusitis symptoms.  O2 sat 94% currently.  Has had difficulty sleeping at night and has felt more short of breath at night.  No increased peripheral edema.  No history of asthma.  Non-smoker.  Feels like he may be wheezing some especially at night.   ROS: See pertinent positives and negatives per HPI.  Past Medical History:  Diagnosis Date   Allergy    Blood in stool    Cancer (HCC) 11/26/2010   prostate   Parkinson disease [facility])     Past Surgical History:  Procedure Laterality Date   CATARACT EXTRACTION Right    HEMORROIDECTOMY  1986   PROSTATE SURGERY  2011   TONSILLECTOMY  1965    Family History  Problem Relation Age of Onset   Cancer Mother        pancreatic   Arthritis Father        ?psoriatic   Parkinson's disease Father    Cancer Maternal Aunt        esophgeal   Cancer Paternal Grandmother        ovarian   Ovarian cancer Paternal Grandmother    Cancer Maternal Grandmother    Colon cancer Neg Hx     SOCIAL HX: Non-smoker   Current Outpatient Medications:    albuterol  (VENTOLIN HFA) 108 (90 Base) MCG/ACT inhaler, Inhale 2 puffs into the lungs every 4 (four) hours as needed for wheezing or shortness of breath., Disp: 8 g, Rfl: 1   carbidopa-levodopa (SINEMET CR) 50-200 MG tablet, TAKE 1 TABLET BY MOUTH EVERYDAY AT BEDTIME, Disp: 90 tablet, Rfl: 1   carbidopa-levodopa (SINEMET IR) 25-100 MG tablet, 2 AT 7AM, 2 AT 11AM, 2 AT 4PM, Disp: 540 tablet, Rfl: 0   ibuprofen (ADVIL,MOTRIN) 200 MG tablet, Take 200 mg by mouth every 6 (six) hours as needed., Disp: , Rfl:    Multiple Vitamins-Minerals (PRESERVISION AREDS 2 PO), Take by mouth., Disp: , Rfl:    predniSONE (DELTASONE) 20 MG tablet, Take 2 tablets by mouth once daily for 5 days, Disp: 10 tablet, Rfl: 0   Probiotic Product (PROBIOTIC DAILY PO), Take by mouth., Disp: , Rfl:   EXAM:  VITALS per patient if applicable:  GENERAL: alert, oriented, appears well and in no acute distress  HEENT: atraumatic, conjunttiva clear, no obvious abnormalities on inspection of external nose and ears  NECK: normal movements of the head and neck  LUNGS: on inspection no signs of respiratory distress, breathing rate appears normal, no obvious gross SOB, gasping or wheezing  CV: no obvious cyanosis  MS: moves all visible extremities without  noticeable abnormality  PSYCH/NEURO: pleasant and cooperative, no obvious depression or anxiety, speech and thought processing grossly intact  ASSESSMENT AND PLAN:  Discussed the following assessment and plan:  Cough.  Patient has had concerns for possible intermittent wheezing.  Denies any red flags such as fever, pleuritic pain, hemoptysis, etc.  -Send in albuterol MDI 2 puffs every 4-6 hours as needed for cough and wheeze -Consider over-the-counter Mucinex up to 1200 mg twice daily -Stay well-hydrated -Start prednisone 20 mg 2 tablets daily for 5 days.  He has no history of diabetes. -Monitor O2 sats and be in touch if consistently low 90s or below -Office follow-up for further  evaluation for any persistent or worsening symptoms.     I discussed the assessment and treatment plan with the patient. The patient was provided an opportunity to ask questions and all were answered. The patient agreed with the plan and demonstrated an understanding of the instructions.   The patient was advised to call back or seek an in-person evaluation if the symptoms worsen or if the condition fails to improve as anticipated.     Evelena Peat, MD

## 2023-09-27 DIAGNOSIS — M170 Bilateral primary osteoarthritis of knee: Secondary | ICD-10-CM | POA: Diagnosis not present

## 2023-11-11 ENCOUNTER — Other Ambulatory Visit: Payer: Self-pay

## 2023-11-11 DIAGNOSIS — G20A1 Parkinson's disease without dyskinesia, without mention of fluctuations: Secondary | ICD-10-CM

## 2023-11-11 MED ORDER — CARBIDOPA-LEVODOPA ER 50-200 MG PO TBCR: EXTENDED_RELEASE_TABLET | ORAL | 0 refills | Status: DC

## 2023-11-23 ENCOUNTER — Other Ambulatory Visit: Payer: Self-pay | Admitting: Neurology

## 2023-11-23 DIAGNOSIS — G20A1 Parkinson's disease without dyskinesia, without mention of fluctuations: Secondary | ICD-10-CM

## 2023-12-03 ENCOUNTER — Other Ambulatory Visit: Payer: Self-pay

## 2023-12-03 ENCOUNTER — Telehealth: Payer: Self-pay | Admitting: Neurology

## 2023-12-03 DIAGNOSIS — G20A1 Parkinson's disease without dyskinesia, without mention of fluctuations: Secondary | ICD-10-CM

## 2023-12-03 MED ORDER — CARBIDOPA-LEVODOPA 25-100 MG PO TABS: ORAL_TABLET | ORAL | 0 refills | Status: DC

## 2023-12-03 NOTE — Telephone Encounter (Signed)
 Pt's wife called in and left a message. She says the pharmacy told her we denied the request for refills of the pt's carbidopa levopa. He runs out of pills tomorrow and wants to see what the issue is

## 2023-12-24 NOTE — Progress Notes (Unsigned)
Assessment/Plan:   1.  Parkinsons Disease  -continue carbidopa/levodopa 25/100, 2 tablets at 7 AM, 2 tablets at 11 AM, 2 tablet at 4 PM  -continue carbidopa/levodopa 50/200 CR at bed for cramping.  This has helped.  -discussed entacapone but we decided to hold on that for now  -pramipexole d/c due to sleepiness   -discussed Parkinsons Disease GeneRation study  -We discussed that it used to be thought that levodopa would increase risk of melanoma but now it is believed that Parkinsons itself likely increases risk of melanoma. he is to get regular skin checks.  He is seeing Dr. Karlyn Agee  -Long discussion again about increasing exercise and types of exercise.  Multiple questions asked and answered those to the best of my ability today.  2.  Dysphagia  -MBE in May, 2022 with evidence of mild oropharyngeal dysphagia.  Regular solid as well as mechanical soft solids with thin liquids were recommended.  3.  Lightheadedness  -start taking BP in various positions at different times of days and send me a log  -increase water intake  4.  Urinary frequency  -needs to f/u with urology  5.  Fatigue  -we discussed that fatigue is the number one treatment resistant complaint of Parkinsons Disease patients.  No matter what we do with the Parkinsons Disease medications (including d/c them), patients tend to c/o fatigue, unfortunately.    -Primary care has recommended sleep study, and I agree with that, but patient declined. 6.  Constipation  -discussed nature and pathophysiology and association with PD  -discussed importance of hydration.  Pt is to increase water intake  -pt is given a copy of the rancho recipe  -recommended daily colace  -recommended miralax prn  Subjective:   James Collier was seen today in follow up for Parkinsons disease, diagnosed last visit.  My previous records were reviewed prior to todays visit as well as outside records available to me.  This patient is accompanied  in the office by his spouse who supplements the history.  Last visit, we had a long discussion about increasing his exercise needs.  We also talked about the fact that he needs to take his blood pressure in various positions and send me a log of those because he was complaining about lightheadedness.  This was back in July.  He emailed me 3 months later to state that he was continuing to have the symptoms, feeling weak and tired in the late afternoon.  We advised him to take the blood pressures and let us know, but also advised that fatigue tends to be the number one treatment resistant complaint of Parkinsons patients.  Again, we have not heard back from him regarding blood pressures.  We also talked about the fact that he needed to see his primary care physician as he was describing orthopnea and chest discomfort.  He has seen his primary care since last visit, but they did not discuss the orthopnea or chest discomfort, but did discuss the fatigue.  I have reviewed primary care notes.  Primary care did want to rule out sleep apnea, but patient declined.  Current prescribed movement disorder medications: Carbidopa/levodopa 25/100, 2/2/2 (increased last visit) Carbidopa/levodopa 50/200 CR at bedtime    PREVIOUS MEDICATIONS: pramipexole (eds)  ALLERGIES:   Allergies  Allergen Reactions   Aloe     rash    CURRENT MEDICATIONS:  Outpatient Encounter Medications as of 12/25/2023  Medication Sig   albuterol (VENTOLIN HFA) 108 (90 Base)  MCG/ACT inhaler Inhale 2 puffs into the lungs every 4 (four) hours as needed for wheezing or shortness of breath.   carbidopa-levodopa (SINEMET CR) 50-200 MG tablet TAKE 1 TABLET BY MOUTH EVERYDAY AT BEDTIME   carbidopa-levodopa (SINEMET IR) 25-100 MG tablet 2 at 7am, 2 at 11am, 2 at 4pm   ibuprofen (ADVIL,MOTRIN) 200 MG tablet Take 200 mg by mouth every 6 (six) hours as needed.   Multiple Vitamins-Minerals (PRESERVISION AREDS 2 PO) Take by mouth.   predniSONE  (DELTASONE) 20 MG tablet Take 2 tablets by mouth once daily for 5 days   Probiotic Product (PROBIOTIC DAILY PO) Take by mouth.   No facility-administered encounter medications on file as of 12/25/2023.    Objective:   PHYSICAL EXAMINATION:    VITALS:   There were no vitals filed for this visit.     GEN:  The patient appears stated age and is in NAD. HEENT:  Normocephalic, atraumatic.  The mucous membranes are moist. The superficial temporal arteries are without ropiness or tenderness. CV:  RRR Lungs:  CTAB Neck:  no bruit   Neurological examination:  Orientation: The patient is alert and oriented x3. Cranial nerves: There is good facial symmetry with facial hypomimia. The speech is fluent and clear. Soft palate rises symmetrically and there is no tongue deviation. Hearing is intact to conversational tone. Sensation: Sensation is intact to light touch throughout Motor: Strength is at least antigravity x4.  Movement examination: Tone: There is mild increased tone in the bilateral UE Abnormal movements: none even with distraction Coordination:  There is mild decremation on the L Gait and Station: The patient has no difficulty arising out of a deep-seated chair without the use of the hands. He has decreased arm swing bilaterally.  He is not short stepped  I have reviewed and interpreted the following labs independently    Chemistry      Component Value Date/Time   NA 138 07/30/2023 0959   K 4.4 07/30/2023 0959   CL 98 07/30/2023 0959   CO2 31 07/30/2023 0959   BUN 13 07/30/2023 0959   CREATININE 1.08 07/30/2023 0959      Component Value Date/Time   CALCIUM 10.1 07/30/2023 0959   ALKPHOS 71 07/30/2023 0959   AST 16 07/30/2023 0959   ALT 7 07/30/2023 0959   BILITOT 1.1 07/30/2023 0959       Lab Results  Component Value Date   WBC 7.5 07/30/2023   HGB 15.8 07/30/2023   HCT 47.3 07/30/2023   MCV 92.9 07/30/2023   PLT 249.0 07/30/2023    Lab Results   Component Value Date   TSH 1.66 07/30/2023     Total time spent on today's visit was *** minutes, including both face-to-face time and nonface-to-face time.  Time included that spent on review of records (prior notes available to me/labs/imaging if pertinent), discussing treatment and goals, answering patient's questions and coordinating care.  Cc:  Kristian Covey, MD

## 2023-12-25 ENCOUNTER — Ambulatory Visit: Payer: Medicare PPO | Admitting: Neurology

## 2023-12-25 ENCOUNTER — Encounter: Payer: Self-pay | Admitting: Neurology

## 2023-12-25 VITALS — BP 132/81 | HR 60 | Resp 20 | Wt 230.0 lb

## 2023-12-25 DIAGNOSIS — G20B2 Parkinson's disease with dyskinesia, with fluctuations: Secondary | ICD-10-CM | POA: Diagnosis not present

## 2023-12-25 MED ORDER — ENTACAPONE 200 MG PO TABS: 200.0000 mg | ORAL_TABLET | Freq: Three times a day (TID) | ORAL | 1 refills | Status: DC

## 2024-01-10 ENCOUNTER — Ambulatory Visit: Payer: Medicare PPO | Admitting: Family Medicine

## 2024-01-10 ENCOUNTER — Encounter: Payer: Self-pay | Admitting: Family Medicine

## 2024-01-10 VITALS — BP 130/70 | HR 78 | Temp 98.1°F | Wt 231.1 lb

## 2024-01-10 DIAGNOSIS — J019 Acute sinusitis, unspecified: Secondary | ICD-10-CM | POA: Diagnosis not present

## 2024-01-10 MED ORDER — AMOXICILLIN-POT CLAVULANATE 875-125 MG PO TABS: 1.0000 | ORAL_TABLET | Freq: Two times a day (BID) | ORAL | 0 refills | Status: DC

## 2024-01-10 NOTE — Progress Notes (Signed)
Established Patient Office Visit  Subjective   Patient ID: James Collier, male    DOB: [birth year]  Age: 68 y.o. MRN: [id number]  Chief Complaint  Patient presents with   Sinusitis   Shortness of Breath    HPI   James Collier is here with concerns for sinusitis.  He states he is not felt well for months.  He had virtual visit for cough back in October and basically has not felt well since then.  His main symptom is sinus congestion and he started to have some more headaches and sinus pressure.  He has noticed some thick mucus with some postnasal drainage.  Significant malaise.  No productive cough.  He has tried saline nose spray without much improvement.  He thinks dry ambient air has contributed.  He is also tried Mucinex and NyQuil.  No fever.  Past Medical History:  Diagnosis Date   Allergy    Blood in stool    Cancer (HCC) 11/26/2010   prostate   Parkinson disease [facility])    Past Surgical History:  Procedure Laterality Date   CATARACT EXTRACTION Right    HEMORROIDECTOMY  1986   PROSTATE SURGERY  2011   TONSILLECTOMY  1965    reports that he has never smoked. He has never used smokeless tobacco. He reports current alcohol use of about 3.0 standard drinks of alcohol per week. He reports that he does not use drugs. family history includes Arthritis in his father; Cancer in his maternal aunt, maternal grandmother, mother, and paternal grandmother; Ovarian cancer in his paternal grandmother; Parkinson's disease in his father. Allergies  Allergen Reactions   Aloe     rash    Review of Systems  Constitutional:  Negative for chills and fever.  HENT:  Positive for congestion and sinus pain.   Neurological:  Positive for headaches.      Objective:     BP 130/70 (BP Location: Left Arm, Patient Position: Sitting, Cuff Size: Large)   Pulse 78   Temp 98.1 F (36.7 C) (Oral)   Wt 231 lb 1.6 oz (104.8 kg)   SpO2 95%   BMI 33.08 kg/m  BP Readings from Last 3 Encounters:   01/10/24 130/70  12/25/23 132/81  07/30/23 110/70   Wt Readings from Last 3 Encounters:  01/10/24 231 lb 1.6 oz (104.8 kg)  12/25/23 230 lb (104.3 kg)  07/30/23 220 lb 3.2 oz (99.9 kg)      Physical Exam Vitals reviewed.  Constitutional:      General: He is not in acute distress.    Appearance: He is not ill-appearing.  HENT:     Mouth/Throat:     Mouth: Mucous membranes are moist.     Pharynx: Oropharynx is clear.  Cardiovascular:     Rate and Rhythm: Normal rate and regular rhythm.  Pulmonary:     Effort: Pulmonary effort is normal.     Breath sounds: No decreased breath sounds, wheezing or rales.  Musculoskeletal:     Cervical back: Neck supple.  Neurological:     Mental Status: He is alert.      No results found for any visits on 01/10/24.    The 10-year ASCVD risk score (Arnett DK, et al., 2019) is: 18.3%    Assessment & Plan:   Several week history of progressive malaise, intermittent headaches, sinus congestive symptoms.  Suspect probably chronic sinusitis.  Lung exam is clear.  Start Augmentin 875 mg twice daily for 10 days.  Plenty fluids  and rest.  Continue Mucinex.  Be in touch if not symptomatically improving after antibiotics.  Evelena Peat, MD

## 2024-01-17 ENCOUNTER — Inpatient Hospital Stay (HOSPITAL_COMMUNITY)
Admission: EM | Admit: 2024-01-17 | Discharge: 2024-01-24 | DRG: 330 | Disposition: A | Discharge status: Home / Self Care | Payer: Medicare PPO | Attending: Surgery | Admitting: Surgery

## 2024-01-17 ENCOUNTER — Encounter (HOSPITAL_COMMUNITY): Payer: Self-pay | Admitting: General Surgery

## 2024-01-17 ENCOUNTER — Emergency Department (HOSPITAL_COMMUNITY): Payer: Medicare PPO

## 2024-01-17 ENCOUNTER — Other Ambulatory Visit: Payer: Self-pay

## 2024-01-17 DIAGNOSIS — K5730 Diverticulosis of large intestine without perforation or abscess without bleeding: Secondary | ICD-10-CM | POA: Diagnosis not present

## 2024-01-17 DIAGNOSIS — K660 Peritoneal adhesions (postprocedural) (postinfection): Secondary | ICD-10-CM | POA: Diagnosis not present

## 2024-01-17 DIAGNOSIS — K5700 Diverticulitis of small intestine with perforation and abscess without bleeding: Principal | ICD-10-CM | POA: Diagnosis present

## 2024-01-17 DIAGNOSIS — K55029 Acute infarction of small intestine, extent unspecified: Secondary | ICD-10-CM | POA: Diagnosis not present

## 2024-01-17 DIAGNOSIS — E871 Hypo-osmolality and hyponatremia: Secondary | ICD-10-CM | POA: Diagnosis not present

## 2024-01-17 DIAGNOSIS — J811 Chronic pulmonary edema: Secondary | ICD-10-CM | POA: Diagnosis not present

## 2024-01-17 DIAGNOSIS — K8020 Calculus of gallbladder without cholecystitis without obstruction: Secondary | ICD-10-CM | POA: Diagnosis not present

## 2024-01-17 DIAGNOSIS — K651 Peritoneal abscess: Secondary | ICD-10-CM | POA: Diagnosis present

## 2024-01-17 DIAGNOSIS — K56609 Unspecified intestinal obstruction, unspecified as to partial versus complete obstruction: Secondary | ICD-10-CM | POA: Diagnosis not present

## 2024-01-17 DIAGNOSIS — Z9109 Other allergy status, other than to drugs and biological substances: Secondary | ICD-10-CM

## 2024-01-17 DIAGNOSIS — K631 Perforation of intestine (nontraumatic): Principal | ICD-10-CM | POA: Diagnosis present

## 2024-01-17 DIAGNOSIS — K5780 Diverticulitis of intestine, part unspecified, with perforation and abscess without bleeding: Secondary | ICD-10-CM | POA: Diagnosis present

## 2024-01-17 DIAGNOSIS — R918 Other nonspecific abnormal finding of lung field: Secondary | ICD-10-CM | POA: Diagnosis not present

## 2024-01-17 DIAGNOSIS — R140 Abdominal distension (gaseous): Secondary | ICD-10-CM | POA: Diagnosis not present

## 2024-01-17 DIAGNOSIS — Z5331 Laparoscopic surgical procedure converted to open procedure: Secondary | ICD-10-CM

## 2024-01-17 DIAGNOSIS — R1030 Lower abdominal pain, unspecified: Secondary | ICD-10-CM | POA: Diagnosis not present

## 2024-01-17 DIAGNOSIS — R0602 Shortness of breath: Secondary | ICD-10-CM | POA: Diagnosis not present

## 2024-01-17 DIAGNOSIS — R9431 Abnormal electrocardiogram [ECG] [EKG]: Secondary | ICD-10-CM | POA: Diagnosis not present

## 2024-01-17 DIAGNOSIS — R109 Unspecified abdominal pain: Secondary | ICD-10-CM | POA: Diagnosis not present

## 2024-01-17 DIAGNOSIS — N281 Cyst of kidney, acquired: Secondary | ICD-10-CM | POA: Diagnosis not present

## 2024-01-17 DIAGNOSIS — R112 Nausea with vomiting, unspecified: Secondary | ICD-10-CM | POA: Diagnosis not present

## 2024-01-17 DIAGNOSIS — K5720 Diverticulitis of large intestine with perforation and abscess without bleeding: Secondary | ICD-10-CM | POA: Diagnosis not present

## 2024-01-17 DIAGNOSIS — J9811 Atelectasis: Secondary | ICD-10-CM | POA: Diagnosis not present

## 2024-01-17 DIAGNOSIS — G20A1 Parkinson's disease without dyskinesia, without mention of fluctuations: Secondary | ICD-10-CM | POA: Diagnosis not present

## 2024-01-17 DIAGNOSIS — Z8546 Personal history of malignant neoplasm of prostate: Secondary | ICD-10-CM | POA: Diagnosis not present

## 2024-01-17 DIAGNOSIS — K5712 Diverticulitis of small intestine without perforation or abscess without bleeding: Secondary | ICD-10-CM | POA: Diagnosis not present

## 2024-01-17 DIAGNOSIS — K567 Ileus, unspecified: Secondary | ICD-10-CM | POA: Diagnosis not present

## 2024-01-17 DIAGNOSIS — K314 Gastric diverticulum: Secondary | ICD-10-CM | POA: Diagnosis not present

## 2024-01-17 DIAGNOSIS — Z9079 Acquired absence of other genital organ(s): Secondary | ICD-10-CM

## 2024-01-17 DIAGNOSIS — R1084 Generalized abdominal pain: Secondary | ICD-10-CM | POA: Diagnosis not present

## 2024-01-17 DIAGNOSIS — Z820 Family history of epilepsy and other diseases of the nervous system: Secondary | ICD-10-CM | POA: Diagnosis not present

## 2024-01-17 DIAGNOSIS — E876 Hypokalemia: Secondary | ICD-10-CM | POA: Diagnosis not present

## 2024-01-17 DIAGNOSIS — J019 Acute sinusitis, unspecified: Secondary | ICD-10-CM | POA: Diagnosis present

## 2024-01-17 DIAGNOSIS — R0989 Other specified symptoms and signs involving the circulatory and respiratory systems: Secondary | ICD-10-CM | POA: Diagnosis not present

## 2024-01-17 LAB — CBC WITH DIFFERENTIAL/PLATELET
Abs Immature Granulocytes: 0.08 10*3/uL — ABNORMAL HIGH (ref 0.00–0.07)
Basophils Absolute: 0 10*3/uL (ref 0.0–0.1)
Basophils Relative: 0 %
Eosinophils Absolute: 0 10*3/uL (ref 0.0–0.5)
Eosinophils Relative: 0 %
HCT: 44.7 % (ref 39.0–52.0)
Hemoglobin: 15.7 g/dL (ref 13.0–17.0)
Immature Granulocytes: 1 %
Lymphocytes Relative: 7 %
Lymphs Abs: 1.2 10*3/uL (ref 0.7–4.0)
MCH: 31.8 pg (ref 26.0–34.0)
MCHC: 35.1 g/dL (ref 30.0–36.0)
MCV: 90.5 fL (ref 80.0–100.0)
Monocytes Absolute: 1.1 10*3/uL — ABNORMAL HIGH (ref 0.1–1.0)
Monocytes Relative: 6 %
Neutro Abs: 14.5 10*3/uL — ABNORMAL HIGH (ref 1.7–7.7)
Neutrophils Relative %: 86 %
Platelets: 245 10*3/uL (ref 150–400)
RBC: 4.94 MIL/uL (ref 4.22–5.81)
RDW: 12.9 % (ref 11.5–15.5)
WBC: 16.9 10*3/uL — ABNORMAL HIGH (ref 4.0–10.5)
nRBC: 0 % (ref 0.0–0.2)

## 2024-01-17 LAB — COMPREHENSIVE METABOLIC PANEL WITH GFR
ALT: 11 U/L (ref 0–44)
AST: 19 U/L (ref 15–41)
Albumin: 3.7 g/dL (ref 3.5–5.0)
Alkaline Phosphatase: 49 U/L (ref 38–126)
Anion gap: 9 (ref 5–15)
BUN: 13 mg/dL (ref 8–23)
CO2: 29 mmol/L (ref 22–32)
Calcium: 9.5 mg/dL (ref 8.9–10.3)
Chloride: 99 mmol/L (ref 98–111)
Creatinine, Ser: 1.14 mg/dL (ref 0.61–1.24)
GFR, Estimated: >60 mL/min (ref >60)
Glucose, Bld: 121 mg/dL — ABNORMAL HIGH (ref 70–99)
Potassium: 4.1 mmol/L (ref 3.5–5.1)
Sodium: 137 mmol/L (ref 135–145)
Total Bilirubin: 0.9 mg/dL (ref 0.0–1.2)
Total Protein: 6.7 g/dL (ref 6.5–8.1)

## 2024-01-17 LAB — URINALYSIS, ROUTINE W REFLEX MICROSCOPIC
Bilirubin Urine: NEGATIVE
Glucose, UA: NEGATIVE mg/dL
Hgb urine dipstick: NEGATIVE
Ketones, ur: 5 mg/dL — AB
Leukocytes,Ua: NEGATIVE
Nitrite: NEGATIVE
Protein, ur: NEGATIVE mg/dL
Specific Gravity, Urine: 1.021 (ref 1.005–1.030)
pH: 5 (ref 5.0–8.0)

## 2024-01-17 LAB — CBC
HCT: 41 % (ref 39.0–52.0)
Hemoglobin: 14.3 g/dL (ref 13.0–17.0)
MCH: 31.4 pg (ref 26.0–34.0)
MCHC: 34.9 g/dL (ref 30.0–36.0)
MCV: 89.9 fL (ref 80.0–100.0)
Platelets: 206 10*3/uL (ref 150–400)
RBC: 4.56 MIL/uL (ref 4.22–5.81)
RDW: 12.6 % (ref 11.5–15.5)
WBC: 14.1 10*3/uL — ABNORMAL HIGH (ref 4.0–10.5)
nRBC: 0 % (ref 0.0–0.2)

## 2024-01-17 LAB — LACTIC ACID, PLASMA
Lactic Acid, Venous: 2.3 mmol/L (ref 0.5–1.9)
Lactic Acid, Venous: 1.9 mmol/L (ref 0.5–1.9)

## 2024-01-17 LAB — BRAIN NATRIURETIC PEPTIDE: B Natriuretic Peptide: 14.3 pg/mL (ref 0.0–100.0)

## 2024-01-17 LAB — CREATININE, SERUM
Creatinine, Ser: 1.09 mg/dL (ref 0.61–1.24)
GFR, Estimated: >60 mL/min (ref >60)

## 2024-01-17 LAB — TROPONIN I (HIGH SENSITIVITY)
Troponin I (High Sensitivity): <2 ng/L (ref <18)
Troponin I (High Sensitivity): 3 ng/L (ref <18)

## 2024-01-17 LAB — HIV ANTIBODY (ROUTINE TESTING W REFLEX): HIV Screen 4th Generation wRfx: NONREACTIVE

## 2024-01-17 LAB — LIPASE, BLOOD: Lipase: 34 U/L (ref 11–51)

## 2024-01-17 MED ORDER — PIPERACILLIN-TAZOBACTAM 3.375 G IVPB 30 MIN
3.3750 g | Freq: Once | INTRAVENOUS | Status: AC
  Administered 2024-01-17: 3.375 g via INTRAVENOUS
  Filled 2024-01-17: qty 50

## 2024-01-17 MED ORDER — FENTANYL CITRATE PF 50 MCG/ML IJ SOSY
50.0000 ug | PREFILLED_SYRINGE | Freq: Once | INTRAMUSCULAR | Status: AC
  Administered 2024-01-17: 50 ug via INTRAVENOUS
  Filled 2024-01-17: qty 1

## 2024-01-17 MED ORDER — METHOCARBAMOL 500 MG PO TABS
500.0000 mg | ORAL_TABLET | Freq: Three times a day (TID) | ORAL | Status: DC | PRN
  Administered 2024-01-21: 500 mg via ORAL
  Filled 2024-01-17: qty 1

## 2024-01-17 MED ORDER — DIPHENHYDRAMINE HCL 50 MG/ML IJ SOLN
25.0000 mg | Freq: Four times a day (QID) | INTRAMUSCULAR | Status: DC | PRN
  Administered 2024-01-18: 25 mg via INTRAVENOUS
  Filled 2024-01-17: qty 1

## 2024-01-17 MED ORDER — DIPHENHYDRAMINE HCL 25 MG PO CAPS
25.0000 mg | ORAL_CAPSULE | Freq: Four times a day (QID) | ORAL | Status: DC | PRN
  Administered 2024-01-18: 25 mg via ORAL
  Filled 2024-01-17: qty 1

## 2024-01-17 MED ORDER — CARBIDOPA-LEVODOPA ER 50-200 MG PO TBCR
1.0000 | EXTENDED_RELEASE_TABLET | Freq: Every day | ORAL | Status: DC
Start: 2024-01-17 — End: 2024-01-18
  Administered 2024-01-17: 1 via ORAL
  Filled 2024-01-17 (×2): qty 1

## 2024-01-17 MED ORDER — MELATONIN 3 MG PO TABS: 3.0000 mg | ORAL_TABLET | Freq: Every evening | ORAL | Status: DC | PRN

## 2024-01-17 MED ORDER — ONDANSETRON 4 MG PO TBDP: 4.0000 mg | ORAL_TABLET | Freq: Four times a day (QID) | ORAL | Status: DC | PRN

## 2024-01-17 MED ORDER — ENTACAPONE 200 MG PO TABS
200.0000 mg | ORAL_TABLET | Freq: Three times a day (TID) | ORAL | Status: DC
  Administered 2024-01-18 (×2): 200 mg via ORAL
  Filled 2024-01-17 (×5): qty 1

## 2024-01-17 MED ORDER — LACTATED RINGERS IV SOLN: INTRAVENOUS | Status: DC

## 2024-01-17 MED ORDER — ALBUTEROL SULFATE (2.5 MG/3ML) 0.083% IN NEBU: 3.0000 mL | INHALATION_SOLUTION | RESPIRATORY_TRACT | Status: DC | PRN

## 2024-01-17 MED ORDER — ONDANSETRON HCL 4 MG/2ML IJ SOLN: 4.0000 mg | Freq: Four times a day (QID) | INTRAMUSCULAR | Status: DC | PRN

## 2024-01-17 MED ORDER — CARBIDOPA-LEVODOPA 25-100 MG PO TABS
2.0000 | ORAL_TABLET | Freq: Three times a day (TID) | ORAL | Status: DC
  Administered 2024-01-17 – 2024-01-18 (×3): 2 via ORAL
  Filled 2024-01-17 (×3): qty 2

## 2024-01-17 MED ORDER — HYDROMORPHONE HCL 1 MG/ML IJ SOLN: 0.5000 mg | INTRAMUSCULAR | Status: DC | PRN

## 2024-01-17 MED ORDER — PIPERACILLIN-TAZOBACTAM 3.375 G IVPB
3.3750 g | Freq: Three times a day (TID) | INTRAVENOUS | Status: DC
  Administered 2024-01-17 – 2024-01-23 (×17): 3.375 g via INTRAVENOUS
  Filled 2024-01-17 (×16): qty 50

## 2024-01-17 MED ORDER — ONDANSETRON HCL 4 MG/2ML IJ SOLN
4.0000 mg | Freq: Once | INTRAMUSCULAR | Status: AC
  Administered 2024-01-17: 4 mg via INTRAVENOUS
  Filled 2024-01-17: qty 2

## 2024-01-17 MED ORDER — SENNOSIDES-DOCUSATE SODIUM 8.6-50 MG PO TABS
1.0000 | ORAL_TABLET | Freq: Every evening | ORAL | Status: DC | PRN
  Administered 2024-01-21: 1 via ORAL
  Filled 2024-01-17: qty 1

## 2024-01-17 MED ORDER — ACETAMINOPHEN 500 MG PO TABS
1000.0000 mg | ORAL_TABLET | Freq: Four times a day (QID) | ORAL | Status: DC
  Administered 2024-01-17 – 2024-01-18 (×3): 1000 mg via ORAL
  Filled 2024-01-17 (×3): qty 2

## 2024-01-17 MED ORDER — SIMETHICONE 80 MG PO CHEW
40.0000 mg | CHEWABLE_TABLET | Freq: Four times a day (QID) | ORAL | Status: DC | PRN
  Administered 2024-01-19 – 2024-01-23 (×8): 40 mg via ORAL
  Filled 2024-01-17 (×8): qty 1

## 2024-01-17 MED ORDER — DOCUSATE SODIUM 100 MG PO CAPS
100.0000 mg | ORAL_CAPSULE | Freq: Two times a day (BID) | ORAL | Status: DC
  Administered 2024-01-17 – 2024-01-23 (×10): 100 mg via ORAL
  Filled 2024-01-17 (×13): qty 1

## 2024-01-17 MED ORDER — METHOCARBAMOL 1000 MG/10ML IJ SOLN
500.0000 mg | Freq: Three times a day (TID) | INTRAMUSCULAR | Status: DC | PRN
  Administered 2024-01-19: 500 mg via INTRAVENOUS
  Filled 2024-01-17: qty 10

## 2024-01-17 MED ORDER — IOHEXOL 350 MG/ML SOLN
75.0000 mL | Freq: Once | INTRAVENOUS | Status: AC | PRN
  Administered 2024-01-17: 75 mL via INTRAVENOUS

## 2024-01-17 MED ORDER — ENOXAPARIN SODIUM 40 MG/0.4ML IJ SOSY
40.0000 mg | PREFILLED_SYRINGE | INTRAMUSCULAR | Status: DC
  Administered 2024-01-17 – 2024-01-23 (×7): 40 mg via SUBCUTANEOUS
  Filled 2024-01-17 (×7): qty 0.4

## 2024-01-17 MED ORDER — HYDROMORPHONE HCL 1 MG/ML IJ SOLN
0.5000 mg | INTRAMUSCULAR | Status: DC | PRN
  Administered 2024-01-17: 1 mg via INTRAVENOUS
  Filled 2024-01-17: qty 1

## 2024-01-17 MED ORDER — SODIUM CHLORIDE 0.9 % IV BOLUS
500.0000 mL | Freq: Once | INTRAVENOUS | Status: AC
  Administered 2024-01-17: 500 mL via INTRAVENOUS

## 2024-01-17 MED ORDER — METOPROLOL TARTRATE 5 MG/5ML IV SOLN: 5.0000 mg | Freq: Four times a day (QID) | INTRAVENOUS | Status: DC | PRN

## 2024-01-17 MED ORDER — OXYCODONE HCL 5 MG PO TABS
5.0000 mg | ORAL_TABLET | ORAL | Status: DC | PRN
  Administered 2024-01-17 – 2024-01-22 (×7): 5 mg via ORAL
  Filled 2024-01-17: qty 1
  Filled 2024-01-17: qty 2
  Filled 2024-01-17 (×3): qty 1
  Filled 2024-01-17: qty 2
  Filled 2024-01-17 (×2): qty 1

## 2024-01-17 NOTE — Progress Notes (Signed)
Patient received 1mg  of dilaudid at 709PM and felt it was too strong, feeling loopy.  But he says he isn't having any abdominal pain.  Only if he pushes around and looks for the pain can he find it.  Vitals are stable.  Will continue to monitor abdominal exam closely.  If he worsens tonight will consider taking him to the operating room emergently, otherwise will have team reassess in the morning.  Quentin Ore, MD General, Bariatric and Minimally Invasive Surgery [facility]

## 2024-01-17 NOTE — ED Notes (Signed)
After administer carbidopa-levodopa (SINEMET IR) 25-100 MG per tablet immediate release 2 tablet ,Pt informed RN that Pt had taken home dosage of medication after talking with a provider in the room.   While trying to stop pt from taking med upon realizing it was the same as home med taken, wife knocked oxycodone 5mg  on floor.   Ciro Backer MD paged and aware of ongoing situation.

## 2024-01-17 NOTE — ED Triage Notes (Signed)
According to [facility]: Coming from abdominal pain starting at 0700, lower quadrants with distention all round.  No rigidity, rebound tenderness, or palpable mass. Was able to use restroom this am. Polypharmacy with multiple medications with abdominal side effects (entacapone,carbidopa, multiple antibiotic, etc).  Vitals; Bp 128/76 HR 72 Spo2 96% room air

## 2024-01-17 NOTE — H&P (Addendum)
History and Physical Exam  James Collier Jan 01, 1956  [id number].    Requesting MD: Dr. Rush Landmark Chief Complaint/Reason for Consult: small bowel perforation with abscess  HPI:  68 y.o. male with medical history significant for Parkinson's disease, prostate cancer s/p prostatectomy who presented to [facility] ED via EMS with abdominal pain. Pain began this morning soon after waking and was initially mild but progressively worsened to severe pain. He had a small but otherwise normal bowel movement after pain began. He has some chronic constipation in setting of Parkinsons for which he intermittently takes stool softeners. He has not been nauseas or had emesis and is actually feeling hungry at time of my exam. He has had some worsening abdominal distension noticeable over the last 2 weeks. He has not had changes in bowel habits or PO intake during that time. Pain has improved in the ED with pain medications. He saw his PCP on 2/14 for sinusitis for which he has been taking augmentin with last dose this am.  He denies personal or family history of GI cancers. He has seen GI in the past for colonoscopies with last scope in 2017 by Dr. [name] with findings of diverticulosis in left colon.  He drinks alcohol occasionally. He denies tobacco or other substance use.  Past abdominal surgeries include prostatectomy  ROS: Reviewed and as above  Family History  Problem Relation Age of Onset   Cancer Mother        pancreatic   Arthritis Father        ?psoriatic   Parkinson's disease Father    Cancer Maternal Aunt        esophgeal   Cancer Paternal Grandmother        ovarian   Ovarian cancer Paternal Grandmother    Cancer Maternal Grandmother    Colon cancer Neg Hx     Past Medical History:  Diagnosis Date   Allergy    Blood in stool    Cancer (HCC) 11/26/2010   prostate   Parkinson disease (HCC)     Past Surgical History:  Procedure Laterality Date   CATARACT EXTRACTION Right     HEMORROIDECTOMY  1986   PROSTATE SURGERY  2011   TONSILLECTOMY  1965    Social History:  reports that he has never smoked. He has never used smokeless tobacco. He reports current alcohol use of about 3.0 standard drinks of alcohol per week. He reports that he does not use drugs.  Allergies:  Allergies  Allergen Reactions   Aloe     rash    (Not in a hospital admission)   Blood pressure 125/72, pulse 92, temperature 98.1 F (36.7 C), temperature source Oral, resp. rate 16, height 5\' 10"  (1.778 m), weight 104.3 kg, SpO2 96%. Physical Exam: General: pleasant, WD, male who is laying in bed in NAD. Uncomfortable appearing HEENT: head is normocephalic, atraumatic.  Sclera are noninjected.  Pupils equal and round. EOMs intact.  Ears and nose without any masses or lesions.  Mouth is pink and moist Heart: tachycardia in 100s with regular rhythm  Lungs: Respiratory effort nonlabored Abd: soft, moderate distension. Focal TTP LUQ and L mid abdomen without peritonitis MSK: all 4 extremities are symmetrical with no cyanosis, clubbing, or edema. Skin: warm and dry with no masses, lesions, or rashes Neuro: Cranial nerves 2-12 grossly intact, sensation is normal throughout Psych: A&Ox3 with an appropriate affect.    Results for orders placed or performed during the hospital encounter of  01/17/24 (from the past 48 hours)  Urinalysis, Routine w reflex microscopic -Urine, Clean Catch     Status: Abnormal   Collection Time: 01/17/24  8:56 AM  Result Value Ref Range   Color, Urine AMBER (A) YELLOW    Comment: BIOCHEMICALS MAY BE AFFECTED BY COLOR   APPearance CLEAR CLEAR   Specific Gravity, Urine 1.021 1.005 - 1.030   pH 5.0 5.0 - 8.0   Glucose, UA NEGATIVE NEGATIVE mg/dL   Hgb urine dipstick NEGATIVE NEGATIVE   Bilirubin Urine NEGATIVE NEGATIVE   Ketones, ur 5 (A) NEGATIVE mg/dL   Protein, ur NEGATIVE NEGATIVE mg/dL   Nitrite NEGATIVE NEGATIVE   Leukocytes,Ua NEGATIVE NEGATIVE    Comment:  Performed at [facility], [address] [place], [place] [address]  Comprehensive metabolic panel     Status: Abnormal   Collection Time: 01/17/24  9:18 AM  Result Value Ref Range   Sodium 137 135 - 145 mmol/L   Potassium 4.1 3.5 - 5.1 mmol/L   Chloride 99 98 - 111 mmol/L   CO2 29 22 - 32 mmol/L   Glucose, Bld 121 (H) 70 - 99 mg/dL    Comment: Glucose reference range applies only to samples taken after fasting for at least 8 hours.   BUN 13 8 - 23 mg/dL   Creatinine, Ser 6.57 0.61 - 1.24 mg/dL   Calcium 9.5 8.9 - 84.6 mg/dL   Total Protein 6.7 6.5 - 8.1 g/dL   Albumin 3.7 3.5 - 5.0 g/dL   AST 19 15 - 41 U/L   ALT 11 0 - 44 U/L   Alkaline Phosphatase 49 38 - 126 U/L   Total Bilirubin 0.9 0.0 - 1.2 mg/dL   GFR, Estimated >96 >29 mL/min    Comment: (NOTE) Calculated using the CKD-EPI Creatinine Equation (2021)    Anion gap 9 5 - 15    Comment: Performed at [facility], [address], [place], [place] [address]  Lipase, blood     Status: None   Collection Time: 01/17/24  9:18 AM  Result Value Ref Range   Lipase 34 11 - 51 U/L    Comment: Performed at [facility], [address], [place], [place] [address]  CBC with Diff     Status: Abnormal   Collection Time: 01/17/24  9:18 AM  Result Value Ref Range   WBC 16.9 (H) 4.0 - 10.5 K/uL   RBC 4.94 4.22 - 5.81 MIL/uL   Hemoglobin 15.7 13.0 - 17.0 g/dL   HCT 10.2 72.5 - 36.6 %   MCV 90.5 80.0 - 100.0 fL   MCH 31.8 26.0 - 34.0 pg   MCHC 35.1 30.0 - 36.0 g/dL   RDW 44.0 34.7 - 42.5 %   Platelets 245 150 - 400 K/uL   nRBC 0.0 0.0 - 0.2 %   Neutrophils Relative % 86 %   Neutro Abs 14.5 (H) 1.7 - 7.7 K/uL   Lymphocytes Relative 7 %   Lymphs Abs 1.2 0.7 - 4.0 K/uL   Monocytes Relative 6 %   Monocytes Absolute 1.1 (H) 0.1 - 1.0 K/uL   Eosinophils Relative 0 %   Eosinophils Absolute 0.0 0.0 - 0.5 K/uL   Basophils Relative 0 %   Basophils Absolute 0.0 0.0 - 0.1 K/uL   Immature Granulocytes 1 %   Abs Immature  Granulocytes 0.08 (H) 0.00 - 0.07 K/uL    Comment: Performed at [facility], [address] [place], [place] [address]  Lactic acid, plasma     Status: Abnormal   Collection Time: 01/17/24  9:18 AM  Result Value Ref Range   Lactic Acid, Venous 2.3 (HH) 0.5 - 1.9 mmol/L    Comment: CRITICAL RESULT CALLED TO, READ BACK BY AND VERIFIED WITH Ashok Croon RN , @1002 , 01/17/24, Dabdee,T. Performed at [facility], [address] [place], [place] [address]   Troponin I (High Sensitivity)     Status: None   Collection Time: 01/17/24  9:18 AM  Result Value Ref Range   Troponin I (High Sensitivity) <2 <18 ng/L    Comment: (NOTE) Elevated high sensitivity troponin I (hsTnI) values and significant  changes across serial measurements may suggest ACS but many other  chronic and acute conditions are known to elevate hsTnI results.  Refer to the "Links" section for chest pain algorithms and additional  guidance. Performed at [facility], [address], [place], [place] [address]   Troponin I (High Sensitivity)     Status: None   Collection Time: 01/17/24 11:52 AM  Result Value Ref Range   Troponin I (High Sensitivity) 3 <18 ng/L    Comment: (NOTE) Elevated high sensitivity troponin I (hsTnI) values and significant  changes across serial measurements may suggest ACS but many other  chronic and acute conditions are known to elevate hsTnI results.  Refer to the "Links" section for chest pain algorithms and additional  guidance. Performed at [facility], [address] [place], [place] [address]   Lactic acid, plasma     Status: None   Collection Time: 01/17/24  2:11 PM  Result Value Ref Range   Lactic Acid, Venous 1.9 0.5 - 1.9 mmol/L    Comment: Performed at [facility], [address], [place], [place] [address]   CT ABDOMEN PELVIS W CONTRAST Addendum Date: 01/17/2024 ADDENDUM REPORT: 01/17/2024 14:17 ADDENDUM: Findings conveyed toCHRISTOPHER TEGELER on  01/17/2024  at14:15. Electronically Signed   By: Genevive Bi M.D.   On: 01/17/2024 14:17   Result Date: 01/17/2024 CLINICAL DATA:  Bowel obstruction suspected. Abdominal pain. Pain and distension. Nine hundred ten pain. EXAM: CT ABDOMEN AND PELVIS WITH CONTRAST TECHNIQUE: Multidetector CT imaging of the abdomen and pelvis was performed using the standard protocol following bolus administration of intravenous contrast. RADIATION DOSE REDUCTION: This exam was performed according to the departmental dose-optimization program which includes automated exposure control, adjustment of the mA and/or kV according to patient size and/or use of iterative reconstruction technique. CONTRAST:  75mL OMNIPAQUE IOHEXOL 350 MG/ML SOLN COMPARISON:  CT 05/24/2012 FINDINGS: Lower chest: Mild linear atelectasis at the lung bases. Hepatobiliary: No biliary duct dilatation. Multiple depending gallstones. No gallbladder inflammation. Pancreas: Pancreas is normal. No ductal dilatation. No pancreatic inflammation. Spleen: Normal spleen Adrenals/urinary tract: Adrenal glands normal. Bilateral simple fluid attenuation renal cyst on postcontrast imaging consistent benign nonenhancing cysts. Ureters and bladder normal. Stomach/Bowel: The stomach and duodenum are normal. In the LEFT upper quadrant there is inflammation within the peritoneal fat surrounding the small bowel. There is a rounded collection adjacent to a loop of small bowel measuring 3.4 x 3.5 cm (image 45/series 3). This collection has fluid and gas centrally and appears to be contiguous with the small bowel (coronal image 120/series 6). There is small amount of intraluminal gas adjacent to this contained collection (image 47/3). Additionally small amount of extraluminal gas in the LEFT ventral peritoneal surface on image 38/3. Findings are consistent with  small bowel perforation. Suspect perforation and an adjacent and contained abscess formation. No significant intraperitoneal  free fluid. Appendix is normal. Colon rectosigmoid colon normal. There are several diverticula of the descending colon. Vascular/Lymphatic: Abdominal aorta is normal caliber. No periportal or retroperitoneal adenopathy. No pelvic adenopathy. Reproductive: Abdominal is normal caliber. No minimal intimal calcification. No calcification at the ostia of the SMA or celiac trunk. No lymphadenopathy. Other: No free fluid. Musculoskeletal: No aggressive osseous lesion. IMPRESSION: 1. Small bowel perforation in the LEFT upper quadrant with adjacent contained abscess formation. Small volume intraperitoneal free air. No clear etiology for perforation identified. 2. No significant intraperitoneal free fluid. 3. Normal appendix. 4. Cholelithiasis without evidence cholecystitis. 5. Benign renal cysts. No follow-up recommended. Electronically Signed: By: Genevive Bi M.D. On: 01/17/2024 14:02   DG Chest Portable 1 View Result Date: 01/17/2024 CLINICAL DATA:  Shortness of breath EXAM: PORTABLE CHEST 1 VIEW COMPARISON:  Chest radiograph dated 04/28/2022 FINDINGS: Low lung volumes with bronchovascular crowding. Diffuse interstitial opacities. Left basilar linear opacity. Suspected trace bilateral pleural effusions with fluid along the right minor fissure. No pneumothorax. Enlarged cardiomediastinal silhouette. No acute osseous abnormality. IMPRESSION: 1. Findings of pulmonary edema with suspected trace bilateral pleural effusions. 2. Cardiomegaly. Electronically Signed   By: Agustin Cree M.D.   On: 01/17/2024 10:25      Assessment/Plan Intraabdominal abscess with concern for small bowel perforation - 3.4 x 3.5 cm  Patient seen and examined and relevant labs and imaging personally reviewed. Work up concerning for small bowel perforation of uncertain etiology. Vital signs overall reassuring - BP normal to high and heart rate slightly elevated 100s. Abdominal exam overall reassuring with focal tenderness on left. No  nausea/vomiting and having bowel function. Emergent surgery is not needed and can hold off on NGT given lack of n/v. Admit for IV antibiotics, IV fluids, bowel rest and close monitoring of abdominal exam. May ultimately require IR drain or surgical exploration with risk for ostomy pending progress.  FEN: NPO ID: [email or web address] VTE: [email or web address]  Sinusitis - on Augmentin 2/14. On iv abx here Parkinson's disease - home meds once rx review complete  I reviewed ED provider notes, last 24 h vitals and pain scores, last 48 h intake and output, last 24 h labs and trends, and last 24 h imaging results.   Eric Form, [facility] 01/17/2024, 3:06 PM Please see Amion for pager number during day hours 7:00am-4:30pm

## 2024-01-17 NOTE — ED Provider Notes (Signed)
[place] EMERGENCY DEPARTMENT AT [facility] Provider Note   CSN: [id number] Arrival date & time: 01/17/24  9562     History  Chief Complaint  Patient presents with   Abdominal Pain    James Collier is a 68 y.o. male.  The history is provided by the patient, medical records and the EMS personnel. No language interpreter was used.  Abdominal Pain Pain location:  Generalized Pain quality: aching, bloating and cramping   Pain radiates to:  Does not radiate Pain severity:  Severe Onset quality:  Sudden Duration:  1 hour Timing:  Constant Progression:  Worsening Chronicity:  New Context: previous surgery (prostate)   Context: not trauma   Relieved by:  Nothing Worsened by:  Palpation Ineffective treatments:  None tried Associated symptoms: constipation, nausea and shortness of breath   Associated symptoms: no chest pain, no chills, no cough, no diarrhea, no dysuria, no fatigue, no fever and no vomiting        Home Medications Prior to Admission medications   Medication Sig Start Date End Date Taking? Authorizing Provider  albuterol (VENTOLIN HFA) 108 (90 Base) MCG/ACT inhaler Inhale 2 puffs into the lungs every 4 (four) hours as needed for wheezing or shortness of breath. 09/25/23   Burchette, Elberta Fortis, MD  amoxicillin-clavulanate (AUGMENTIN) 875-125 MG tablet Take 1 tablet by mouth 2 (two) times daily. 01/10/24   Burchette, Elberta Fortis, MD  carbidopa-levodopa (SINEMET CR) 50-200 MG tablet TAKE 1 TABLET BY MOUTH EVERYDAY AT BEDTIME 11/11/23   Tat, Rebecca S, DO  carbidopa-levodopa (SINEMET IR) 25-100 MG tablet 2 at 7am, 2 at 11am, 2 at 4pm 12/03/23   Tat, Octaviano Batty, DO  cetirizine (ZYRTEC) 10 MG tablet Take 10 mg by mouth daily.    [provider]  entacapone (COMTAN) 200 MG tablet Take 1 tablet (200 mg total) by mouth 3 (three) times daily. 7am/11am/4pm with carbidopa/levodopa 12/25/23   Tat, Octaviano Batty, DO  ibuprofen (ADVIL,MOTRIN) 200 MG tablet Take 200 mg by  mouth every 6 (six) hours as needed.    [provider]  Multiple Vitamins-Minerals (PRESERVISION AREDS 2 PO) Take by mouth.    [provider]  predniSONE (DELTASONE) 20 MG tablet Take 2 tablets by mouth once daily for 5 days 09/25/23   Kristian Covey, MD  Probiotic Product (PROBIOTIC DAILY PO) Take by mouth.    [provider]      Allergies    Aloe    Review of Systems   Review of Systems  Constitutional:  Negative for chills, fatigue and fever.  HENT:  Positive for congestion.   Respiratory:  Positive for shortness of breath. Negative for cough, chest tightness and wheezing.   Cardiovascular:  Positive for leg swelling. Negative for chest pain and palpitations.  Gastrointestinal:  Positive for abdominal distention, abdominal pain, constipation and nausea. Negative for blood in stool, diarrhea and vomiting.  Genitourinary:  Negative for dysuria, flank pain and frequency.  Musculoskeletal:  Negative for back pain.  Skin:  Negative for rash.  Neurological:  Negative for headaches.  Psychiatric/Behavioral:  Negative for agitation and confusion.   All other systems reviewed and are negative.   Physical Exam Updated Vital Signs BP 128/67 (BP Location: Right Arm)   Pulse 72   Temp 98 F (36.7 C) (Oral)   Resp 19   Ht 5\' 10"  (1.778 m)   Wt 104.3 kg   SpO2 96%   BMI 33.00 kg/m  Physical Exam Vitals and  nursing note reviewed.  Constitutional:      General: He is not in acute distress.    Appearance: He is well-developed. He is not ill-appearing, toxic-appearing or diaphoretic.  HENT:     Head: Normocephalic and atraumatic.     Mouth/Throat:     Mouth: Mucous membranes are moist.  Eyes:     Conjunctiva/sclera: Conjunctivae normal.  Cardiovascular:     Rate and Rhythm: Normal rate and regular rhythm.     Heart sounds: No murmur heard. Pulmonary:     Effort: Pulmonary effort is normal. No respiratory distress.     Breath sounds: Normal breath  sounds.  Abdominal:     General: Bowel sounds are normal. There is distension.     Palpations: Abdomen is soft.     Tenderness: There is abdominal tenderness. There is no guarding or rebound.  Musculoskeletal:        General: No swelling.     Cervical back: Neck supple.  Skin:    General: Skin is warm and dry.     Capillary Refill: Capillary refill takes less than 2 seconds.     Coloration: Skin is not pale.     Findings: No rash.  Neurological:     General: No focal deficit present.     Mental Status: He is alert.  Psychiatric:        Mood and Affect: Mood normal.     ED Results / Procedures / Treatments   Labs (all labs ordered are listed, but only abnormal results are displayed) Labs Reviewed  COMPREHENSIVE METABOLIC PANEL - Abnormal; Notable for the following components:      Result Value   Glucose, Bld 121 (*)    All other components within normal limits  CBC WITH DIFFERENTIAL/PLATELET - Abnormal; Notable for the following components:   WBC 16.9 (*)    Neutro Abs 14.5 (*)    Monocytes Absolute 1.1 (*)    Abs Immature Granulocytes 0.08 (*)    All other components within normal limits  URINALYSIS, ROUTINE W REFLEX MICROSCOPIC - Abnormal; Notable for the following components:   Color, Urine AMBER (*)    Ketones, ur 5 (*)    All other components within normal limits  LACTIC ACID, PLASMA - Abnormal; Notable for the following components:   Lactic Acid, Venous 2.3 (*)    All other components within normal limits  LIPASE, BLOOD  LACTIC ACID, PLASMA  BRAIN NATRIURETIC PEPTIDE  TROPONIN I (HIGH SENSITIVITY)  TROPONIN I (HIGH SENSITIVITY)    EKG EKG Interpretation Date/Time:  Friday January 17 2024 09:56:41 EST Ventricular Rate:  84 PR Interval:  152 QRS Duration:  97 QT Interval:  361 QTC Calculation: 427 R Axis:   28  Text Interpretation: Sinus rhythm no prior ECG for comparison No STEMI Confirmed by Theda Belfast ([id number]) on 01/17/2024 10:44:57  AM  Radiology CT ABDOMEN PELVIS W CONTRAST Addendum Date: 01/17/2024 ADDENDUM REPORT: 01/17/2024 14:17 ADDENDUM: Findings conveyed toCHRISTOPHER Oseias Horsey on 01/17/2024  at14:15. Electronically Signed   By: Genevive Bi M.D.   On: 01/17/2024 14:17   Result Date: 01/17/2024 CLINICAL DATA:  Bowel obstruction suspected. Abdominal pain. Pain and distension. Nine hundred ten pain. EXAM: CT ABDOMEN AND PELVIS WITH CONTRAST TECHNIQUE: Multidetector CT imaging of the abdomen and pelvis was performed using the standard protocol following bolus administration of intravenous contrast. RADIATION DOSE REDUCTION: This exam was performed according to the departmental dose-optimization program which includes automated exposure control, adjustment of the mA and/or kV according  to patient size and/or use of iterative reconstruction technique. CONTRAST:  75mL OMNIPAQUE IOHEXOL 350 MG/ML SOLN COMPARISON:  CT 05/24/2012 FINDINGS: Lower chest: Mild linear atelectasis at the lung bases. Hepatobiliary: No biliary duct dilatation. Multiple depending gallstones. No gallbladder inflammation. Pancreas: Pancreas is normal. No ductal dilatation. No pancreatic inflammation. Spleen: Normal spleen Adrenals/urinary tract: Adrenal glands normal. Bilateral simple fluid attenuation renal cyst on postcontrast imaging consistent benign nonenhancing cysts. Ureters and bladder normal. Stomach/Bowel: The stomach and duodenum are normal. In the LEFT upper quadrant there is inflammation within the peritoneal fat surrounding the small bowel. There is a rounded collection adjacent to a loop of small bowel measuring 3.4 x 3.5 cm (image 45/series 3). This collection has fluid and gas centrally and appears to be contiguous with the small bowel (coronal image 120/series 6). There is small amount of intraluminal gas adjacent to this contained collection (image 47/3). Additionally small amount of extraluminal gas in the LEFT ventral peritoneal surface on image  38/3. Findings are consistent with small bowel perforation. Suspect perforation and an adjacent and contained abscess formation. No significant intraperitoneal free fluid. Appendix is normal. Colon rectosigmoid colon normal. There are several diverticula of the descending colon. Vascular/Lymphatic: Abdominal aorta is normal caliber. No periportal or retroperitoneal adenopathy. No pelvic adenopathy. Reproductive: Abdominal is normal caliber. No minimal intimal calcification. No calcification at the ostia of the SMA or celiac trunk. No lymphadenopathy. Other: No free fluid. Musculoskeletal: No aggressive osseous lesion. IMPRESSION: 1. Small bowel perforation in the LEFT upper quadrant with adjacent contained abscess formation. Small volume intraperitoneal free air. No clear etiology for perforation identified. 2. No significant intraperitoneal free fluid. 3. Normal appendix. 4. Cholelithiasis without evidence cholecystitis. 5. Benign renal cysts. No follow-up recommended. Electronically Signed: By: Genevive Bi M.D. On: 01/17/2024 14:02   DG Chest Portable 1 View Result Date: 01/17/2024 CLINICAL DATA:  Shortness of breath EXAM: PORTABLE CHEST 1 VIEW COMPARISON:  Chest radiograph dated 04/28/2022 FINDINGS: Low lung volumes with bronchovascular crowding. Diffuse interstitial opacities. Left basilar linear opacity. Suspected trace bilateral pleural effusions with fluid along the right minor fissure. No pneumothorax. Enlarged cardiomediastinal silhouette. No acute osseous abnormality. IMPRESSION: 1. Findings of pulmonary edema with suspected trace bilateral pleural effusions. 2. Cardiomegaly. Electronically Signed   By: Agustin Cree M.D.   On: 01/17/2024 10:25    Procedures Procedures    CRITICAL CARE Performed by: Canary Brim Lodema Parma Total critical care time: 45 minutes Critical care time was exclusive of separately billable procedures and treating other patients. Critical care was necessary to treat or  prevent imminent or life-threatening deterioration. Critical care was time spent personally by me on the following activities: development of treatment plan with patient and/or surrogate as well as nursing, discussions with consultants, evaluation of patient's response to treatment, examination of patient, obtaining history from patient or surrogate, ordering and performing treatments and interventions, ordering and review of laboratory studies, ordering and review of radiographic studies, pulse oximetry and re-evaluation of patient's condition.  Medications Ordered in ED Medications  piperacillin-tazobactam (ZOSYN) IVPB 3.375 g (3.375 g Intravenous New Bag/Given 01/17/24 1420)    Followed by  piperacillin-tazobactam (ZOSYN) IVPB 3.375 g (has no administration in time range)  sodium chloride 0.9 % bolus 500 mL (has no administration in time range)  fentaNYL (SUBLIMAZE) injection 50 mcg (50 mcg Intravenous Given 01/17/24 1022)  ondansetron (ZOFRAN) injection 4 mg (4 mg Intravenous Given 01/17/24 1016)  iohexol (OMNIPAQUE) 350 MG/ML injection 75 mL (75 mLs Intravenous Contrast Given 01/17/24  1250)  sodium chloride 0.9 % bolus 500 mL (500 mLs Intravenous New Bag/Given 01/17/24 1414)    ED Course/ Medical Decision Making/ A&P                                 Medical Decision Making Amount and/or Complexity of Data Reviewed Labs: ordered. Radiology: ordered.  Risk Prescription drug management. Decision regarding hospitalization.    James Collier is a 68 y.o. male with a past medical history significant for Parkinson's disease, previous prostate cancer status post surgery who presents with severe abdominal pain.  According to patient, he went to bed normal last night and woke up this morning with severe pain around 7 AM.  Reports it is all across his abdomen and he is had some nausea.  No vomiting.  He reports he had a bowel movement right before the pain began and since then he is not sure if he  has passed gas.  It feels full and distended.  He denies history of previous bowel obstruction, diverticulitis, appendicitis, cholecystitis.  He reports no urinary changes.  He does report he has had worsening peripheral edema for about 2 months and his legs and has had shortness of breath he recently where he cannot lay flat at night.  He is reporting some shortness of breath now with his abdominal distention.  Otherwise he denies fevers, chills, or cough.  He has been on Augmentin recently for suspected sinus infection with some congestion.  On exam, lungs clear.  Chest nontender.  Abdomen is quite tender diffusely.  It is slightly swollen.  Bowel sounds were difficult to auscultate but I did hear some bowel sounds.  Flanks nontender.  Legs are edematous bilaterally reports has been there for several months.  Good pulses.  Clinically I do feel need to get imaging to rule out acute bowel obstruction however with his new edema in the legs, difficulty with shortness of breath and not laying flat, will get troponin, EKG, BNP, and a chest x-ray.  Will give some pain medicine nausea medicine initially.  Anticipate reassessment after workup to determine disposition.  I reviewed the images myself unconcerned about abscess or perforation of some kind.  I called radiology who will look at the images.  Spoke to radiology who suspects small bowel perforation and abscess and intra-abdominal and close free air as opposed to a diverticulitis causing this.  Will call general surgery  2:24 PM Spoke to general surgery who will come see patient.  Zosyn was ordered.  He will remain NPO until they see him.  Anticipate admission  4:07 PM General Surgery will admit for further management.        Final Clinical Impression(s) / ED Diagnoses Final diagnoses:  Bowel perforation (HCC)  Intra-abdominal abscess (HCC)    Clinical Impression: 1. Bowel perforation (HCC)   2. Intra-abdominal abscess (HCC)      Disposition: Admit  This note was prepared with assistance of Dragon voice recognition software. Occasional wrong-word or sound-a-like substitutions may have occurred due to the inherent limitations of voice recognition software.     Maili Shutters, Canary Brim, MD 01/17/24 [phone]

## 2024-01-18 ENCOUNTER — Encounter (HOSPITAL_COMMUNITY): Payer: Self-pay

## 2024-01-18 ENCOUNTER — Inpatient Hospital Stay (HOSPITAL_COMMUNITY): Payer: Medicare PPO | Admitting: Certified Registered Nurse Anesthetist

## 2024-01-18 ENCOUNTER — Other Ambulatory Visit: Payer: Self-pay

## 2024-01-18 ENCOUNTER — Encounter (HOSPITAL_COMMUNITY): Admission: EM | Disposition: A | Discharge status: Home / Self Care | Payer: Self-pay | Source: Home / Self Care

## 2024-01-18 DIAGNOSIS — Z5331 Laparoscopic surgical procedure converted to open procedure: Secondary | ICD-10-CM | POA: Diagnosis not present

## 2024-01-18 DIAGNOSIS — K56609 Unspecified intestinal obstruction, unspecified as to partial versus complete obstruction: Secondary | ICD-10-CM | POA: Diagnosis not present

## 2024-01-18 HISTORY — PX: LAPAROSCOPY: SHX197

## 2024-01-18 LAB — CBC
HCT: 42.1 % (ref 39.0–52.0)
Hemoglobin: 14.5 g/dL (ref 13.0–17.0)
MCH: 31.8 pg (ref 26.0–34.0)
MCHC: 34.4 g/dL (ref 30.0–36.0)
MCV: 92.3 fL (ref 80.0–100.0)
Platelets: 203 10*3/uL (ref 150–400)
RBC: 4.56 MIL/uL (ref 4.22–5.81)
RDW: 12.9 % (ref 11.5–15.5)
WBC: 10.3 10*3/uL (ref 4.0–10.5)
nRBC: 0 % (ref 0.0–0.2)

## 2024-01-18 LAB — BASIC METABOLIC PANEL
Anion gap: 12 (ref 5–15)
BUN: 11 mg/dL (ref 8–23)
CO2: 27 mmol/L (ref 22–32)
Calcium: 9.1 mg/dL (ref 8.9–10.3)
Chloride: 98 mmol/L (ref 98–111)
Creatinine, Ser: 1.09 mg/dL (ref 0.61–1.24)
GFR, Estimated: >60 mL/min (ref >60)
Glucose, Bld: 100 mg/dL — ABNORMAL HIGH (ref 70–99)
Potassium: 3.8 mmol/L (ref 3.5–5.1)
Sodium: 137 mmol/L (ref 135–145)

## 2024-01-18 SURGERY — LAPAROSCOPY, DIAGNOSTIC
Anesthesia: General | Site: Abdomen

## 2024-01-18 MED ORDER — LACTATED RINGERS IV SOLN: INTRAVENOUS | Status: AC

## 2024-01-18 MED ORDER — ORAL CARE MOUTH RINSE: 15.0000 mL | Freq: Once | OROMUCOSAL | Status: AC

## 2024-01-18 MED ORDER — PIPERACILLIN-TAZOBACTAM 3.375 G IVPB 30 MIN
3.3750 g | Freq: Once | INTRAVENOUS | Status: DC
  Filled 2024-01-18: qty 50

## 2024-01-18 MED ORDER — ROCURONIUM BROMIDE 10 MG/ML (PF) SYRINGE
PREFILLED_SYRINGE | INTRAVENOUS | Status: AC
  Filled 2024-01-18: qty 10

## 2024-01-18 MED ORDER — FENTANYL CITRATE (PF) 250 MCG/5ML IJ SOLN
INTRAMUSCULAR | Status: DC | PRN
Start: 2024-01-18 — End: 2024-01-18
  Administered 2024-01-18 (×2): 50 ug via INTRAVENOUS
  Administered 2024-01-18: 100 ug via INTRAVENOUS

## 2024-01-18 MED ORDER — HYDROMORPHONE HCL 1 MG/ML IJ SOLN
INTRAMUSCULAR | Status: AC
  Filled 2024-01-18: qty 1

## 2024-01-18 MED ORDER — PHENOL 1.4 % MT LIQD
1.0000 | OROMUCOSAL | Status: DC | PRN
Start: 2024-01-18 — End: 2024-01-24
  Administered 2024-01-20: 1 via OROMUCOSAL
  Filled 2024-01-18: qty 177

## 2024-01-18 MED ORDER — HYDROMORPHONE HCL 1 MG/ML IJ SOLN
0.5000 mg | INTRAMUSCULAR | Status: DC | PRN
  Administered 2024-01-19 – 2024-01-20 (×3): 0.5 mg via INTRAVENOUS
  Filled 2024-01-18 (×3): qty 0.5

## 2024-01-18 MED ORDER — PHENYLEPHRINE 80 MCG/ML (10ML) SYRINGE FOR IV PUSH (FOR BLOOD PRESSURE SUPPORT)
PREFILLED_SYRINGE | INTRAVENOUS | Status: DC | PRN
  Administered 2024-01-18 (×3): 80 ug via INTRAVENOUS

## 2024-01-18 MED ORDER — PROPOFOL 10 MG/ML IV BOLUS
INTRAVENOUS | Status: DC | PRN
  Administered 2024-01-18: 130 mg via INTRAVENOUS

## 2024-01-18 MED ORDER — PHENYLEPHRINE 80 MCG/ML (10ML) SYRINGE FOR IV PUSH (FOR BLOOD PRESSURE SUPPORT)
PREFILLED_SYRINGE | INTRAVENOUS | Status: AC
  Filled 2024-01-18: qty 10

## 2024-01-18 MED ORDER — LIDOCAINE 2% (20 MG/ML) 5 ML SYRINGE
INTRAMUSCULAR | Status: AC
  Filled 2024-01-18: qty 5

## 2024-01-18 MED ORDER — SUGAMMADEX SODIUM 200 MG/2ML IV SOLN
INTRAVENOUS | Status: AC
  Filled 2024-01-18: qty 2

## 2024-01-18 MED ORDER — CARBIDOPA-LEVODOPA 25-100 MG PO TABS: 2.0000 | ORAL_TABLET | Freq: Three times a day (TID) | ORAL | Status: DC

## 2024-01-18 MED ORDER — LIDOCAINE 2% (20 MG/ML) 5 ML SYRINGE
INTRAMUSCULAR | Status: DC | PRN
  Administered 2024-01-18: 60 mg via INTRAVENOUS

## 2024-01-18 MED ORDER — LACTATED RINGERS IV SOLN: INTRAVENOUS | Status: DC

## 2024-01-18 MED ORDER — BUPIVACAINE-EPINEPHRINE (PF) 0.25% -1:200000 IJ SOLN
INTRAMUSCULAR | Status: AC
  Filled 2024-01-18: qty 30

## 2024-01-18 MED ORDER — CARBIDOPA-LEVODOPA 25-100 MG PO TABS
2.0000 | ORAL_TABLET | Freq: Three times a day (TID) | ORAL | Status: DC
  Administered 2024-01-18 – 2024-01-24 (×18): 2 via ORAL
  Filled 2024-01-18 (×21): qty 2

## 2024-01-18 MED ORDER — ONDANSETRON HCL 4 MG/2ML IJ SOLN
INTRAMUSCULAR | Status: AC
  Filled 2024-01-18: qty 2

## 2024-01-18 MED ORDER — SUGAMMADEX SODIUM 200 MG/2ML IV SOLN
INTRAVENOUS | Status: DC | PRN
  Administered 2024-01-18: 200 mg via INTRAVENOUS

## 2024-01-18 MED ORDER — FENTANYL CITRATE (PF) 250 MCG/5ML IJ SOLN
INTRAMUSCULAR | Status: AC
Start: 2024-01-18 — End: ?
  Filled 2024-01-18: qty 5

## 2024-01-18 MED ORDER — PROPOFOL 10 MG/ML IV BOLUS
INTRAVENOUS | Status: AC
  Filled 2024-01-18: qty 20

## 2024-01-18 MED ORDER — ONDANSETRON HCL 4 MG/2ML IJ SOLN
INTRAMUSCULAR | Status: DC | PRN
  Administered 2024-01-18: 4 mg via INTRAVENOUS

## 2024-01-18 MED ORDER — DEXAMETHASONE SODIUM PHOSPHATE 10 MG/ML IJ SOLN
INTRAMUSCULAR | Status: DC | PRN
Start: 2024-01-18 — End: 2024-01-18
  Administered 2024-01-18: 5 mg via INTRAVENOUS

## 2024-01-18 MED ORDER — DEXAMETHASONE SODIUM PHOSPHATE 10 MG/ML IJ SOLN
INTRAMUSCULAR | Status: AC
  Filled 2024-01-18: qty 1

## 2024-01-18 MED ORDER — SODIUM CHLORIDE 0.9 % IR SOLN
Status: DC | PRN
  Administered 2024-01-18: 1000 mL
  Administered 2024-01-18: 2000 mL

## 2024-01-18 MED ORDER — ACETAMINOPHEN 10 MG/ML IV SOLN
INTRAVENOUS | Status: AC
  Filled 2024-01-18: qty 100

## 2024-01-18 MED ORDER — BUPIVACAINE-EPINEPHRINE 0.25% -1:200000 IJ SOLN
INTRAMUSCULAR | Status: DC | PRN
  Administered 2024-01-18: 5 mL

## 2024-01-18 MED ORDER — SUCCINYLCHOLINE CHLORIDE 200 MG/10ML IV SOSY
PREFILLED_SYRINGE | INTRAVENOUS | Status: DC | PRN
Start: 2024-01-18 — End: 2024-01-18
  Administered 2024-01-18: 120 mg via INTRAVENOUS

## 2024-01-18 MED ORDER — LORAZEPAM 2 MG/ML IJ SOLN
1.0000 mg | Freq: Once | INTRAMUSCULAR | Status: AC
  Administered 2024-01-18: 1 mg via INTRAVENOUS
  Filled 2024-01-18: qty 1

## 2024-01-18 MED ORDER — CHLORHEXIDINE GLUCONATE 0.12 % MT SOLN
15.0000 mL | Freq: Once | OROMUCOSAL | Status: AC
  Administered 2024-01-18: 15 mL via OROMUCOSAL

## 2024-01-18 MED ORDER — DROPERIDOL 2.5 MG/ML IJ SOLN: 0.6250 mg | Freq: Once | INTRAMUSCULAR | Status: DC | PRN

## 2024-01-18 MED ORDER — ROCURONIUM BROMIDE 10 MG/ML (PF) SYRINGE
PREFILLED_SYRINGE | INTRAVENOUS | Status: DC | PRN
  Administered 2024-01-18: 50 mg via INTRAVENOUS
  Administered 2024-01-18: 20 mg via INTRAVENOUS

## 2024-01-18 MED ORDER — HYDROMORPHONE HCL 1 MG/ML IJ SOLN
0.2500 mg | INTRAMUSCULAR | Status: DC | PRN
  Administered 2024-01-18 (×2): 0.5 mg via INTRAVENOUS

## 2024-01-18 MED ORDER — LACTATED RINGERS IV SOLN: INTRAVENOUS | Status: DC | PRN

## 2024-01-18 MED ORDER — ACETAMINOPHEN 10 MG/ML IV SOLN
1000.0000 mg | Freq: Once | INTRAVENOUS | Status: AC
  Administered 2024-01-18: 1000 mg via INTRAVENOUS

## 2024-01-18 MED ORDER — SUCCINYLCHOLINE CHLORIDE 200 MG/10ML IV SOSY
PREFILLED_SYRINGE | INTRAVENOUS | Status: AC
  Filled 2024-01-18: qty 10

## 2024-01-18 SURGICAL SUPPLY — 41 items
APPLIER CLIP 5 13 M/L LIGAMAX5 (MISCELLANEOUS) IMPLANT
BAG COUNTER SPONGE SURGICOUNT (BAG) ×2 IMPLANT
CANISTER SUCT 3000ML PPV (MISCELLANEOUS) IMPLANT
CHLORAPREP W/TINT 26 (MISCELLANEOUS) ×2 IMPLANT
CLIP APPLIE 5 13 M/L LIGAMAX5 (MISCELLANEOUS) IMPLANT
COVER SURGICAL LIGHT HANDLE (MISCELLANEOUS) ×2 IMPLANT
DERMABOND ADVANCED .7 DNX12 (GAUZE/BANDAGES/DRESSINGS) ×2 IMPLANT
DRAPE C-ARM 42X72 X-RAY (DRAPES) IMPLANT
DRAPE LAPAROSCOPIC ABDOMINAL (DRAPES) ×2 IMPLANT
DRSG OPSITE POSTOP 4X10 (GAUZE/BANDAGES/DRESSINGS) IMPLANT
DRSG TEGADERM 4X4.5 CHG (GAUZE/BANDAGES/DRESSINGS) IMPLANT
ELECT REM PT RETURN 9FT ADLT (ELECTROSURGICAL) ×1 IMPLANT
ELECTRODE REM PT RTRN 9FT ADLT (ELECTROSURGICAL) ×2 IMPLANT
GAUZE SPONGE 2X2 8PLY STRL LF (GAUZE/BANDAGES/DRESSINGS) IMPLANT
GLOVE SURG SIGNA 7.5 PF LTX (GLOVE) ×2 IMPLANT
GOWN STRL REUS W/ TWL LRG LVL3 (GOWN DISPOSABLE) ×4 IMPLANT
GOWN STRL REUS W/ TWL XL LVL3 (GOWN DISPOSABLE) ×2 IMPLANT
IRRIG SUCT STRYKERFLOW 2 WTIP (MISCELLANEOUS) IMPLANT
IRRIGATION SUCT STRKRFLW 2 WTP (MISCELLANEOUS) IMPLANT
KIT BASIN OR (CUSTOM PROCEDURE TRAY) ×2 IMPLANT
KIT TURNOVER KIT B (KITS) ×2 IMPLANT
LIGASURE IMPACT 36 18CM CVD LR (INSTRUMENTS) IMPLANT
NS IRRIG 1000ML POUR BTL (IV SOLUTION) ×2 IMPLANT
PAD ARMBOARD 7.5X6 YLW CONV (MISCELLANEOUS) ×2 IMPLANT
RELOAD PROXIMATE 75MM BLUE (ENDOMECHANICALS) ×2 IMPLANT
RELOAD STAPLE 75 3.8 BLU REG (ENDOMECHANICALS) IMPLANT
SCISSORS LAP 5X35 DISP (ENDOMECHANICALS) IMPLANT
SET TUBE SMOKE EVAC HIGH FLOW (TUBING) ×2 IMPLANT
SLEEVE Z-THREAD 5X100MM (TROCAR) ×2 IMPLANT
SPIKE FLUID TRANSFER (MISCELLANEOUS) ×2 IMPLANT
STAPLER GUN LINEAR PROX 60 (STAPLE) IMPLANT
SUT MON AB 4-0 PC3 18 (SUTURE) ×2 IMPLANT
SUT PDS AB 1 TP1 96 (SUTURE) IMPLANT
SUT VICRYL 0 UR6 27IN ABS (SUTURE) IMPLANT
TOWEL GREEN STERILE (TOWEL DISPOSABLE) ×2 IMPLANT
TOWEL GREEN STERILE FF (TOWEL DISPOSABLE) ×2 IMPLANT
TRAY LAPAROSCOPIC MC (CUSTOM PROCEDURE TRAY) ×2 IMPLANT
TROCAR 11X100 Z THREAD (TROCAR) IMPLANT
TROCAR BALLN 12MMX100 BLUNT (TROCAR) IMPLANT
TROCAR Z-THREAD OPTICAL 5X100M (TROCAR) ×2 IMPLANT
WARMER LAPAROSCOPE (MISCELLANEOUS) ×2 IMPLANT

## 2024-01-18 NOTE — Anesthesia Procedure Notes (Signed)
 Procedure Name: Intubation Date/Time: 01/18/2024 1:48 PM  Performed by: Audie Pinto, CRNAPre-anesthesia Checklist: Patient identified, Emergency Drugs available, Suction available and Patient being monitored Patient Re-evaluated:Patient Re-evaluated prior to induction Oxygen Delivery Method: Circle system utilized Preoxygenation: Pre-oxygenation with 100% oxygen Induction Type: IV induction, Rapid sequence and Cricoid Pressure applied Ventilation: Mask ventilation without difficulty Laryngoscope Size: Mac and 4 Grade View: Grade II Tube type: Oral Tube size: 7.5 mm Number of attempts: 1 Airway Equipment and Method: Stylet and Oral airway Placement Confirmation: ETT inserted through vocal cords under direct vision, positive ETCO2 and breath sounds checked- equal and bilateral Secured at: 23 cm Tube secured with: Tape Dental Injury: Teeth and Oropharynx as per pre-operative assessment

## 2024-01-18 NOTE — Transfer of Care (Signed)
 Immediate Anesthesia Transfer of Care Note  Patient: James Collier  Procedure(s) Performed: ATTEMPTED LAPAROSCOPY CONVERTED TO OPEN DIAGNOSTIC LAPAROTOMY WITH BOWEL RESCTION (Abdomen)  Patient Location: PACU  Anesthesia Type:General  Level of Consciousness: awake, alert , pateint uncooperative, confused, and responds to stimulation  Airway & Oxygen Therapy: Patient Spontanous Breathing and Patient connected to nasal cannula oxygen  Post-op Assessment: Report given to RN, Post -op Vital signs reviewed and stable, and Patient moving all extremities X 4  Post vital signs: Reviewed and stable  Last Vitals:  Vitals Value Taken Time  BP 133/111 01/18/24 1530  Temp 37.1 C 01/18/24 1526  Pulse 88 01/18/24 1545  Resp 16 01/18/24 1545  SpO2 95 % 01/18/24 1545  Vitals shown include unfiled device data.  Last Pain:  Vitals:   01/18/24 1150  TempSrc: Oral  PainSc:          Complications: There were no known notable events for this encounter.

## 2024-01-18 NOTE — Progress Notes (Signed)
 Subjective/Chief Complaint: He reports he overall feels better with pain now only in the left abdomen but is still moderate   Objective: Vital signs in last 24 hours: Temp:  [97.5 F (36.4 C)-100.3 F (37.9 C)] 98.6 F (37 C) (02/22 0813) Pulse Rate:  [72-100] 78 (02/22 0813) Resp:  [16-23] 18 (02/22 0813) BP: (109-139)/(69-78) 129/72 (02/22 0813) SpO2:  [95 %-97 %] 96 % (02/22 0813) Last BM Date : 01/17/24  Intake/Output from previous day: 02/21 0701 - 02/22 0700 In: 162.9 [P.O.:10; I.V.:114; IV Piggyback:38.9] Out: -  Intake/Output this shift: No intake/output data recorded.  Exam: Awake and alert Looks more comfortable than yesterday Abdomen soft, now only focally  moderately tender with guarding in the left lateral mid abdomen  Lab Results:  Recent Labs    01/17/24 1810 01/18/24 0545  WBC 14.1* 10.3  HGB 14.3 14.5  HCT 41.0 42.1  PLT 206 203   BMET Recent Labs    01/17/24 0918 01/17/24 1810 01/18/24 0545  NA 137  --  137  K 4.1  --  3.8  CL 99  --  98  CO2 29  --  27  GLUCOSE 121*  --  100*  BUN 13  --  11  CREATININE 1.14 1.09 1.09  CALCIUM 9.5  --  9.1   PT/INR No results for input(s): "LABPROT", "INR" in the last 72 hours. ABG No results for input(s): "PHART", "HCO3" in the last 72 hours.  Invalid input(s): "PCO2", "PO2"  Studies/Results: CT ABDOMEN PELVIS W CONTRAST Addendum Date: 01/17/2024 ADDENDUM REPORT: 01/17/2024 14:17 ADDENDUM: Findings conveyed toCHRISTOPHER TEGELER on 01/17/2024  at14:15. Electronically Signed   By: Genevive Bi M.D.   On: 01/17/2024 14:17   Result Date: 01/17/2024 CLINICAL DATA:  Bowel obstruction suspected. Abdominal pain. Pain and distension. Nine hundred ten pain. EXAM: CT ABDOMEN AND PELVIS WITH CONTRAST TECHNIQUE: Multidetector CT imaging of the abdomen and pelvis was performed using the standard protocol following bolus administration of intravenous contrast. RADIATION DOSE REDUCTION: This exam was  performed according to the departmental dose-optimization program which includes automated exposure control, adjustment of the mA and/or kV according to patient size and/or use of iterative reconstruction technique. CONTRAST:  75mL OMNIPAQUE IOHEXOL 350 MG/ML SOLN COMPARISON:  CT 05/24/2012 FINDINGS: Lower chest: Mild linear atelectasis at the lung bases. Hepatobiliary: No biliary duct dilatation. Multiple depending gallstones. No gallbladder inflammation. Pancreas: Pancreas is normal. No ductal dilatation. No pancreatic inflammation. Spleen: Normal spleen Adrenals/urinary tract: Adrenal glands normal. Bilateral simple fluid attenuation renal cyst on postcontrast imaging consistent benign nonenhancing cysts. Ureters and bladder normal. Stomach/Bowel: The stomach and duodenum are normal. In the LEFT upper quadrant there is inflammation within the peritoneal fat surrounding the small bowel. There is a rounded collection adjacent to a loop of small bowel measuring 3.4 x 3.5 cm (image 45/series 3). This collection has fluid and gas centrally and appears to be contiguous with the small bowel (coronal image 120/series 6). There is small amount of intraluminal gas adjacent to this contained collection (image 47/3). Additionally small amount of extraluminal gas in the LEFT ventral peritoneal surface on image 38/3. Findings are consistent with small bowel perforation. Suspect perforation and an adjacent and contained abscess formation. No significant intraperitoneal free fluid. Appendix is normal. Colon rectosigmoid colon normal. There are several diverticula of the descending colon. Vascular/Lymphatic: Abdominal aorta is normal caliber. No periportal or retroperitoneal adenopathy. No pelvic adenopathy. Reproductive: Abdominal is normal caliber. No minimal intimal calcification. No calcification at the  ostia of the SMA or celiac trunk. No lymphadenopathy. Other: No free fluid. Musculoskeletal: No aggressive osseous lesion.  IMPRESSION: 1. Small bowel perforation in the LEFT upper quadrant with adjacent contained abscess formation. Small volume intraperitoneal free air. No clear etiology for perforation identified. 2. No significant intraperitoneal free fluid. 3. Normal appendix. 4. Cholelithiasis without evidence cholecystitis. 5. Benign renal cysts. No follow-up recommended. Electronically Signed: By: Genevive Bi M.D. On: 01/17/2024 14:02   DG Chest Portable 1 View Result Date: 01/17/2024 CLINICAL DATA:  Shortness of breath EXAM: PORTABLE CHEST 1 VIEW COMPARISON:  Chest radiograph dated 04/28/2022 FINDINGS: Low lung volumes with bronchovascular crowding. Diffuse interstitial opacities. Left basilar linear opacity. Suspected trace bilateral pleural effusions with fluid along the right minor fissure. No pneumothorax. Enlarged cardiomediastinal silhouette. No acute osseous abnormality. IMPRESSION: 1. Findings of pulmonary edema with suspected trace bilateral pleural effusions. 2. Cardiomegaly. Electronically Signed   By: Agustin Cree M.D.   On: 01/17/2024 10:25    Anti-infectives: Anti-infectives (From admission, onward)    Start     Dose/Rate Route Frequency Ordered Stop   01/17/24 2100  piperacillin-tazobactam (ZOSYN) IVPB 3.375 g       Placed in "Followed by" Linked Group   3.375 g 12.5 mL/hr over 240 Minutes Intravenous Every 8 hours 01/17/24 1411     01/17/24 1415  piperacillin-tazobactam (ZOSYN) IVPB 3.375 g       Placed in "Followed by" Linked Group   3.375 g 100 mL/hr over 30 Minutes Intravenous  Once 01/17/24 1411 01/17/24 1651       Assessment/Plan: Small focal contained small bowel perforation with abscess  The patient's white blood count is now normal and his vitals have normalized as well.  His tenderness on abdominal exam is now just focal to the area of concern on the CT scan.   As he is still fairly tender with guarding in this area and the etiology is not quite certain, I recommend we go ahead  and proceed to the operating room for a diagnostic laparoscopy with possible open laparotomy and small bowel resection. I discussed the risk of surgery with him.  These risks include but are not limited to bleeding, ongoing infection, the likely need for a bowel resection, anastomotic leak, potential for drain placement, the potential need for other procedures, injury to surrounding structures, cardiopulmonary issues with anesthesia, DVT, postoperative recovery, etc.  He understands and agrees to proceed with surgery.  I will be discussing this with his wife this morning as well.    Abigail Miyamoto 01/18/2024

## 2024-01-18 NOTE — Op Note (Signed)
 01/18/2024  3:15 PM  PATIENT:  James Collier  68 y.o. male  PRE-OPERATIVE DIAGNOSIS:  CONTAINED SMALL BOWEL PERFORATION  POST-OPERATIVE DIAGNOSIS:  CONTAINED PERFORATED SMALL BOWEL DIVERTICULUM  PROCEDURE:  Procedure(s): Exploratory laparoscopy Exploratory laparotomy Lysis of adhesions 15 minutes Small bowel resection  SURGEON:  Surgeon(s): Violeta Gelinas, MD  ASSISTANTS: none   ANESTHESIA:   general  EBL:  Total I/O In: -  Out: 700 [Urine:700]  BLOOD ADMINISTERED:none  DRAINS: Nasogastric Tube   SPECIMEN:  Excision  DISPOSITION OF SPECIMEN:  PATHOLOGY  COUNTS:  YES  DICTATION: .Dragon Dictation Findings: Contained perforation of what looks like a jejunal diverticulum although it was very firm, possibly full of succus.  No generalized peritonitis.  Omental adhesions down into the pelvis.  Procedure in detail: Informed consent was obtained.  He is receiving IV antibiotics.  He was brought to the operating room and general endotracheal anesthesia was administered by the anesthesia staff.  His abdomen was prepped and draped in a sterile fashion.  Timeout procedure was performed.The upper midline region was infiltrated with local.  Limited upper midline incision was made. Subcutaneous tissues were dissected down revealing the anterior fascia. This was divided sharply along the midline. Peritoneal cavity was entered under direct vision without complication. A 0 Vicryl pursestring was placed around the fascial opening. Hassan trocar was inserted into the abdomen. The abdomen was insufflated with carbon dioxide in standard fashion. Under direct vision a 5 mm left lower quadrant port was placed.  Local was used at that port site as well.  Laparoscopic exploration revealed the omentum covering the area of concern.  It was very apparent.  It was very firm.  The omentum had dense adhesions down into the pelvis as well.  I was not able to adequately visualize the area of concern in the  small bowel couple by adherent omentum so decision was made to open.  Ports were removed.  I made a limited midline incision.  Midline fascia was opened from the [place] trocar port site cephalad.  Exploration revealed significant omental adhesions as seen down to into the pelvis.  These were taken down with the assistance of the LigaSure.  This allowed the area of inflamed small bowel to be visualized.  It was wrapped in omentum.  The omentum was gently teased off and revealed what looks like a perforated small bowel diverticulum.  It was in the jejunum on the antimesenteric side.  It was very firm, however, possibly full of succus versus a mass.  There was a tiny perforation.  There was no generalized peritonitis.  The omentum that completely contained this small perforation.  The jejunum was divided proximally and distally with GIA 75 stapler.  I took down the mesentery using the LigaSure.  Of note, there were no palpable significant lymph nodes in the mesentery in this region.  The specimen was sent to pathology.  I then performed a side-to-side anastomosis with GIA 75.  The common defect was closed with a TA 60.  I placed multiple 3-0 silk sutures to reinforce the staple lines.  I placed an apical suture of 2-0 silk as well.  The anastomosis was nice and pink and widely patent.  I closed the mesentery with interrupted 2-0 silk sutures.  I then thoroughly irrigated the abdomen.  I changed my gloves.  The area of adhesiolysis in the pelvis appeared fine.  The small bowel anastomosis remained pink and viable.  Nasogastric tube was confirmed in position in the stomach.  The omentum was brought back over the small bowel.  Hemostasis was ensured.  The fascia was then closed with running #1 looped PDS.  In light of this very tiny perforation being completely contained, I irrigated the subcutaneous tissues and closed the skin with staples.  I closed the left lower quadrant port site with staples as well.  All counts  were correct.  He tolerated the procedure well without apparent complication and was taken recovery in stable condition. PATIENT DISPOSITION:  PACU - hemodynamically stable.   Delay start of Pharmacological VTE agent (>24hrs) due to surgical blood loss or risk of bleeding:  no  Violeta Gelinas, MD, MPH, FACS Pager: [phone]  2/22/20253:15 PM

## 2024-01-18 NOTE — Plan of Care (Signed)
  Problem: Clinical Measurements: Goal: Respiratory complications will improve Outcome: Progressing   Problem: Elimination: Goal: Will not experience complications related to urinary retention Outcome: Progressing   Problem: Safety: Goal: Ability to remain free from injury will improve Outcome: Progressing   Problem: Skin Integrity: Goal: Risk for impaired skin integrity will decrease Outcome: Progressing   Problem: Health Behavior/Discharge Planning: Goal: Ability to manage health-related needs will improve Outcome: Not Progressing   Problem: Activity: Goal: Risk for activity intolerance will decrease Outcome: Not Progressing   Problem: Nutrition: Goal: Adequate nutrition will be maintained Outcome: Not Progressing   Problem: Coping: Goal: Level of anxiety will decrease Outcome: Not Progressing   Problem: Pain Managment: Goal: General experience of comfort will improve and/or be controlled Outcome: Not Progressing

## 2024-01-18 NOTE — Progress Notes (Signed)
 The patient arrived to 6N12 from PACU complaining of pain and discomfort in his throat asking to have the NG tube removed. I informed him that the NG tube would have to stay. Left the room to get pain medicine for patient and was informed by the unit secretary the patient had removed the NG tube. Went to assess patient and found the NF tube lying in the bed next to him. Informed Violeta Gelinas, MD about loss of NG tube.

## 2024-01-18 NOTE — Anesthesia Postprocedure Evaluation (Signed)
 Anesthesia Post Note  Patient: James Collier  Procedure(s) Performed: ATTEMPTED LAPAROSCOPY CONVERTED TO OPEN DIAGNOSTIC LAPAROTOMY WITH BOWEL RESCTION (Abdomen)     Patient location during evaluation: PACU Anesthesia Type: General Level of consciousness: sedated and patient cooperative Pain management: pain level controlled Vital Signs Assessment: post-procedure vital signs reviewed and stable Respiratory status: spontaneous breathing Cardiovascular status: stable Anesthetic complications: no   There were no known notable events for this encounter.  Last Vitals:  Vitals:   01/18/24 1615 01/18/24 1635  BP: (!) 153/72 116/79  Pulse: 84 86  Resp: 18 18  Temp: 37.4 C 37.1 C  SpO2: 94% 95%    Last Pain:  Vitals:   01/18/24 1635  TempSrc: Oral  PainSc:                  Lewie Loron

## 2024-01-18 NOTE — Progress Notes (Signed)
 Patient ID: James Collier, male   DOB: [birth year], 68 y.o.   MRN: [id number] Patient examined in preop holding.  Please see Dr. Eliberto Ivory note. History & CT reviewed with Dr. Magnus Ivan.  I agree with plan for exploratory laparoscopy, possible exploratory laparotomy, and possible small bowel resection. Procedure, risks, and benefits discussed and he and his wife agree.  Violeta Gelinas, MD, MPH, FACS Please use [email or web address] to contact on call provider

## 2024-01-18 NOTE — Anesthesia Preprocedure Evaluation (Addendum)
 Anesthesia Evaluation  Patient identified by MRN, date of birth, ID band Patient awake    Reviewed: Allergy & Precautions, NPO status , Patient's Chart, lab work & pertinent test results  Airway Mallampati: II  TM Distance: >3 FB Neck ROM: Limited    Dental  (+) Dental Advisory Given, Teeth Intact   Pulmonary neg pulmonary ROS   Pulmonary exam normal breath sounds clear to auscultation       Cardiovascular Normal cardiovascular exam+ Valvular Problems/Murmurs AI and MR  Rhythm:Regular Rate:Normal  Echo 08/2022  1. Left ventricular ejection fraction, by estimation, is 60 to 65%. The left ventricle has normal function. The left ventricle has no regional wall motion abnormalities. There is mild left ventricular hypertrophy. Left ventricular diastolic parameters are consistent with Grade I diastolic dysfunction (impaired relaxation). The average left ventricular global longitudinal strain is -15.1%.   2. Right ventricular systolic function is mildly reduced. The right ventricular size is moderately enlarged. There is normal pulmonary artery systolic pressure. The estimated right ventricular systolic pressure is 22.5 mmHg.   3. Left atrial size was mildly dilated.   4. The mitral valve is normal in structure. Mild mitral valve regurgitation. No evidence of mitral stenosis.   5. The aortic valve is tricuspid. Aortic valve regurgitation is mild. No aortic stenosis is present. Aortic regurgitation PHT measures 502 msec.   6. The inferior vena cava is normal in size with greater than 50% respiratory variability, suggesting right atrial pressure of 3 mmHg.     Neuro/Psych negative neurological ROS     GI/Hepatic negative GI ROS, Neg liver ROS,,,  Endo/Other  negative endocrine ROS    Renal/GU negative Renal ROS     Musculoskeletal negative musculoskeletal ROS (+)    Abdominal   Peds  Hematology negative hematology ROS (+)    Anesthesia Other Findings   Reproductive/Obstetrics                              Anesthesia Physical Anesthesia Plan  ASA: 3 and emergent  Anesthesia Plan: General   Post-op Pain Management: Ofirmev IV (intra-op)*   Induction: Intravenous, Rapid sequence and Cricoid pressure planned  PONV Risk Score and Plan: 4 or greater and Ondansetron, Dexamethasone and Treatment may vary due to age or medical condition  Airway Management Planned: Oral ETT and Video Laryngoscope Planned  Additional Equipment:   Intra-op Plan:   Post-operative Plan: Extubation in OR  Informed Consent: I have reviewed the patients History and Physical, chart, labs and discussed the procedure including the risks, benefits and alternatives for the proposed anesthesia with the patient or authorized representative who has indicated his/her understanding and acceptance.     Dental advisory given  Plan Discussed with: CRNA  Anesthesia Plan Comments:         Anesthesia Quick Evaluation

## 2024-01-19 ENCOUNTER — Encounter (HOSPITAL_COMMUNITY): Payer: Self-pay | Admitting: General Surgery

## 2024-01-19 LAB — CBC
HCT: 41.7 % (ref 39.0–52.0)
Hemoglobin: 14.4 g/dL (ref 13.0–17.0)
MCH: 31.6 pg (ref 26.0–34.0)
MCHC: 34.5 g/dL (ref 30.0–36.0)
MCV: 91.4 fL (ref 80.0–100.0)
Platelets: 203 10*3/uL (ref 150–400)
RBC: 4.56 MIL/uL (ref 4.22–5.81)
RDW: 12.6 % (ref 11.5–15.5)
WBC: 12.4 10*3/uL — ABNORMAL HIGH (ref 4.0–10.5)
nRBC: 0 % (ref 0.0–0.2)

## 2024-01-19 MED ORDER — ENTACAPONE 200 MG PO TABS
200.0000 mg | ORAL_TABLET | Freq: Two times a day (BID) | ORAL | Status: DC
Start: 2024-01-19 — End: 2024-01-19

## 2024-01-19 MED ORDER — SALINE SPRAY 0.65 % NA SOLN
1.0000 | NASAL | Status: DC | PRN
  Administered 2024-01-20: 1 via NASAL
  Filled 2024-01-19: qty 44

## 2024-01-19 MED ORDER — ACETAMINOPHEN 500 MG PO TABS
500.0000 mg | ORAL_TABLET | Freq: Four times a day (QID) | ORAL | Status: DC | PRN
  Administered 2024-01-19 – 2024-01-23 (×4): 500 mg via ORAL
  Filled 2024-01-19 (×4): qty 1

## 2024-01-19 MED ORDER — CARBIDOPA-LEVODOPA ER 50-200 MG PO TBCR: 2.0000 | EXTENDED_RELEASE_TABLET | Freq: Three times a day (TID) | ORAL | Status: DC

## 2024-01-19 MED ORDER — LORAZEPAM 2 MG/ML IJ SOLN
1.0000 mg | Freq: Once | INTRAMUSCULAR | Status: AC
  Administered 2024-01-19: 1 mg via INTRAVENOUS
  Filled 2024-01-19: qty 1

## 2024-01-19 MED ORDER — ENTACAPONE 200 MG PO TABS
200.0000 mg | ORAL_TABLET | ORAL | Status: DC
  Administered 2024-01-19 – 2024-01-24 (×16): 200 mg via ORAL
  Filled 2024-01-19 (×16): qty 1

## 2024-01-19 NOTE — Progress Notes (Signed)
Patient refused NG tube placement.

## 2024-01-19 NOTE — Progress Notes (Signed)
 1 Day Post-Op   Subjective/Chief Complaint: Feels better with only mild post op pain Pulled NG out over night No nausea   Objective: Vital signs in last 24 hours: Temp:  [98.4 F (36.9 C)-99.4 F (37.4 C)] 98.5 F (36.9 C) (02/23 0408) Pulse Rate:  [73-93] 85 (02/23 0408) Resp:  [16-22] 16 (02/23 0408) BP: (116-162)/(72-111) 139/77 (02/23 0408) SpO2:  [89 %-98 %] 98 % (02/23 0408) Weight:  [104.3 kg] 104.3 kg (02/22 1144) Last BM Date : 01/17/24  Intake/Output from previous day: 02/22 0701 - 02/23 0700 In: 3180.3 [I.V.:2793.7; IV Piggyback:386.6] Out: 1650 [Urine:1600; Blood:50] Intake/Output this shift: No intake/output data recorded.  Exam: Awake and alert Abdomen soft, dressing with mild drainage Looks comfortable  Lab Results:  Recent Labs    01/18/24 0545 01/19/24 0753  WBC 10.3 12.4*  HGB 14.5 14.4  HCT 42.1 41.7  PLT 203 203   BMET Recent Labs    01/17/24 0918 01/17/24 1810 01/18/24 0545  NA 137  --  137  K 4.1  --  3.8  CL 99  --  98  CO2 29  --  27  GLUCOSE 121*  --  100*  BUN 13  --  11  CREATININE 1.14 1.09 1.09  CALCIUM 9.5  --  9.1   PT/INR No results for input(s): "LABPROT", "INR" in the last 72 hours. ABG No results for input(s): "PHART", "HCO3" in the last 72 hours.  Invalid input(s): "PCO2", "PO2"  Studies/Results: CT ABDOMEN PELVIS W CONTRAST Addendum Date: 01/17/2024 ADDENDUM REPORT: 01/17/2024 14:17 ADDENDUM: Findings conveyed toCHRISTOPHER TEGELER on 01/17/2024  at14:15. Electronically Signed   By: Genevive Bi M.D.   On: 01/17/2024 14:17   Result Date: 01/17/2024 CLINICAL DATA:  Bowel obstruction suspected. Abdominal pain. Pain and distension. Nine hundred ten pain. EXAM: CT ABDOMEN AND PELVIS WITH CONTRAST TECHNIQUE: Multidetector CT imaging of the abdomen and pelvis was performed using the standard protocol following bolus administration of intravenous contrast. RADIATION DOSE REDUCTION: This exam was performed according  to the departmental dose-optimization program which includes automated exposure control, adjustment of the mA and/or kV according to patient size and/or use of iterative reconstruction technique. CONTRAST:  75mL OMNIPAQUE IOHEXOL 350 MG/ML SOLN COMPARISON:  CT 05/24/2012 FINDINGS: Lower chest: Mild linear atelectasis at the lung bases. Hepatobiliary: No biliary duct dilatation. Multiple depending gallstones. No gallbladder inflammation. Pancreas: Pancreas is normal. No ductal dilatation. No pancreatic inflammation. Spleen: Normal spleen Adrenals/urinary tract: Adrenal glands normal. Bilateral simple fluid attenuation renal cyst on postcontrast imaging consistent benign nonenhancing cysts. Ureters and bladder normal. Stomach/Bowel: The stomach and duodenum are normal. In the LEFT upper quadrant there is inflammation within the peritoneal fat surrounding the small bowel. There is a rounded collection adjacent to a loop of small bowel measuring 3.4 x 3.5 cm (image 45/series 3). This collection has fluid and gas centrally and appears to be contiguous with the small bowel (coronal image 120/series 6). There is small amount of intraluminal gas adjacent to this contained collection (image 47/3). Additionally small amount of extraluminal gas in the LEFT ventral peritoneal surface on image 38/3. Findings are consistent with small bowel perforation. Suspect perforation and an adjacent and contained abscess formation. No significant intraperitoneal free fluid. Appendix is normal. Colon rectosigmoid colon normal. There are several diverticula of the descending colon. Vascular/Lymphatic: Abdominal aorta is normal caliber. No periportal or retroperitoneal adenopathy. No pelvic adenopathy. Reproductive: Abdominal is normal caliber. No minimal intimal calcification. No calcification at the ostia of the SMA  or celiac trunk. No lymphadenopathy. Other: No free fluid. Musculoskeletal: No aggressive osseous lesion. IMPRESSION: 1. Small  bowel perforation in the LEFT upper quadrant with adjacent contained abscess formation. Small volume intraperitoneal free air. No clear etiology for perforation identified. 2. No significant intraperitoneal free fluid. 3. Normal appendix. 4. Cholelithiasis without evidence cholecystitis. 5. Benign renal cysts. No follow-up recommended. Electronically Signed: By: Genevive Bi M.D. On: 01/17/2024 14:02   DG Chest Portable 1 View Result Date: 01/17/2024 CLINICAL DATA:  Shortness of breath EXAM: PORTABLE CHEST 1 VIEW COMPARISON:  Chest radiograph dated 04/28/2022 FINDINGS: Low lung volumes with bronchovascular crowding. Diffuse interstitial opacities. Left basilar linear opacity. Suspected trace bilateral pleural effusions with fluid along the right minor fissure. No pneumothorax. Enlarged cardiomediastinal silhouette. No acute osseous abnormality. IMPRESSION: 1. Findings of pulmonary edema with suspected trace bilateral pleural effusions. 2. Cardiomegaly. Electronically Signed   By: Agustin Cree M.D.   On: 01/17/2024 10:25    Anti-infectives: Anti-infectives (From admission, onward)    Start     Dose/Rate Route Frequency Ordered Stop   01/18/24 1200  piperacillin-tazobactam (ZOSYN) IVPB 3.375 g        3.375 g 100 mL/hr over 30 Minutes Intravenous  Once 01/18/24 1155     01/17/24 2100  piperacillin-tazobactam (ZOSYN) IVPB 3.375 g       Placed in "Followed by" Linked Group   3.375 g 12.5 mL/hr over 240 Minutes Intravenous Every 8 hours 01/17/24 1411     01/17/24 1415  piperacillin-tazobactam (ZOSYN) IVPB 3.375 g       Placed in "Followed by" Linked Group   3.375 g 100 mL/hr over 30 Minutes Intravenous  Once 01/17/24 1411 01/17/24 1651       Assessment/Plan: S/p diagnostic laparoscopy, exploratory laparotomy, small bowel resection for suspected walled off perforated small bowel diverticulum on 2/23 by BT  -POD#1 -Will hold on replacing NGT and will at least let him down to his Parkinson's  medications.  He can have only limited ice chips and sips of medication -Continue IV antibiotics -Awaiting final path  I discussed this with the patient and his wife at the bedside   James Collier 01/19/2024

## 2024-01-19 NOTE — Plan of Care (Signed)

## 2024-01-20 LAB — CBC
HCT: 41.4 % (ref 39.0–52.0)
Hemoglobin: 14.3 g/dL (ref 13.0–17.0)
MCH: 31.6 pg (ref 26.0–34.0)
MCHC: 34.5 g/dL (ref 30.0–36.0)
MCV: 91.4 fL (ref 80.0–100.0)
Platelets: 217 10*3/uL (ref 150–400)
RBC: 4.53 MIL/uL (ref 4.22–5.81)
RDW: 12.5 % (ref 11.5–15.5)
WBC: 11.2 10*3/uL — ABNORMAL HIGH (ref 4.0–10.5)
nRBC: 0 % (ref 0.0–0.2)

## 2024-01-20 MED ORDER — GUAIFENESIN ER 600 MG PO TB12
600.0000 mg | ORAL_TABLET | Freq: Two times a day (BID) | ORAL | Status: DC
  Administered 2024-01-20 – 2024-01-24 (×9): 600 mg via ORAL
  Filled 2024-01-20 (×9): qty 1

## 2024-01-20 NOTE — Progress Notes (Signed)
 PT Cancellation Note  Patient Details Name: James Collier MRN: [id number] DOB: [birth year]   Cancelled Treatment:    Reason Eval/Treat Not Completed: Fatigue/lethargy limiting ability to participate;Patient declined, reporting he was tired and "cranky".  Agreeable for PT to check back in the AM.  Has walked to the bathroom with RN staff and worked with OT earlier today.  Thanks,  Corinna Capra, PT, DPT  Acute Rehabilitation Secure chat is best for contact #[phone] office      Dimas Aguas 01/20/2024, 4:13 PM

## 2024-01-20 NOTE — Progress Notes (Signed)
 2 Days Post-Op  Subjective: CC: Patient having ongoing bloating and some central abdominal pain that is stable to slightly improved from yesterday. Tolerating ice chips. Wants broth. No n/v. No flatus or bm. Voiding without issues. Mobilizing - at baseline lives at home with his wife and walks independently without assistive device.  Asking for mucinex for ongoing sinus congestion for several months that he has been following with his pcp for and was recently on Augmentin + mucinex for. No current sob or cp.   Afebrile. No tachycardia or hypotension. WBC 11.2 (12.4). Hgb stable.   Objective: Vital signs in last 24 hours: Temp:  [97.9 F (36.6 C)-98.8 F (37.1 C)] 97.9 F (36.6 C) (02/24 0748) Pulse Rate:  [84-98] 96 (02/24 0748) Resp:  [16-19] 17 (02/24 0748) BP: (137-178)/(75-89) 160/77 (02/24 0748) SpO2:  [96 %-98 %] 97 % (02/24 0748) Last BM Date : 01/17/24  Intake/Output from previous day: 02/23 0701 - 02/24 0700 In: -  Out: 1600 [Urine:1600] Intake/Output this shift: No intake/output data recorded.  PE: Gen:  Alert, NAD, pleasant Card:  RRR Pulm:  CTAB, no W/R/R, effort normal. On RA currently.  Abd: Mild distension but soft. R sided ttp and appropriately ttp around his incision without rigidity or guarding. Some BS. Midline wound with honeycomb dressing in place with some dried blood on the inferior aspect but no evidence of active bleeding.  Ext:  No LE edema or calf tenderness  Lab Results:  Recent Labs    01/19/24 0753 01/20/24 0701  WBC 12.4* 11.2*  HGB 14.4 14.3  HCT 41.7 41.4  PLT 203 217   BMET Recent Labs    01/17/24 1810 01/18/24 0545  NA  --  137  K  --  3.8  CL  --  98  CO2  --  27  GLUCOSE  --  100*  BUN  --  11  CREATININE 1.09 1.09  CALCIUM  --  9.1   PT/INR No results for input(s): "LABPROT", "INR" in the last 72 hours. CMP     Component Value Date/Time   NA 137 01/18/2024 0545   K 3.8 01/18/2024 0545   CL 98 01/18/2024 0545    CO2 27 01/18/2024 0545   GLUCOSE 100 (H) 01/18/2024 0545   BUN 11 01/18/2024 0545   CREATININE 1.09 01/18/2024 0545   CALCIUM 9.1 01/18/2024 0545   PROT 6.7 01/17/2024 0918   ALBUMIN 3.7 01/17/2024 0918   AST 19 01/17/2024 0918   ALT 11 01/17/2024 0918   ALKPHOS 49 01/17/2024 0918   BILITOT 0.9 01/17/2024 0918   GFRNONAA >60 01/18/2024 0545   GFRAA 69 (L) 05/24/2012 2015   Lipase     Component Value Date/Time   LIPASE 34 01/17/2024 0918    Studies/Results: No results found.  Anti-infectives: Anti-infectives (From admission, onward)    Start     Dose/Rate Route Frequency Ordered Stop   01/18/24 1200  piperacillin-tazobactam (ZOSYN) IVPB 3.375 g  Status:  Discontinued        3.375 g 100 mL/hr over 30 Minutes Intravenous  Once 01/18/24 1155 01/20/24 0952   01/17/24 2100  piperacillin-tazobactam (ZOSYN) IVPB 3.375 g       Placed in "Followed by" Linked Group   3.375 g 12.5 mL/hr over 240 Minutes Intravenous Every 8 hours 01/17/24 1411     01/17/24 1415  piperacillin-tazobactam (ZOSYN) IVPB 3.375 g       Placed in "Followed by" Linked Group   3.375 g  100 mL/hr over 30 Minutes Intravenous  Once 01/17/24 1411 01/17/24 1651        Assessment/Plan POD 2 s/p diagnostic laparoscopy, exploratory laparotomy, LOA, SBR by Dr. Janee Morn for contained perforation small bowel diverticulum - Path pending.  - NGT fell out POD 0. Okay to leave out and allow sips/chips. AROBF - Cont abx, plan 5d post op - Mobilize, PT - Pulm toilet.   FEN - Sips/Chips. IVF at 86ml/hr.  VTE - SCDs, Lovenox  ID - Zosyn Foley - None, spont void  Remote hx of prostate ca s/p surgery Parkinson disease - home meds   LOS: 3 days    Jacinto Halim, [facility] 01/20/2024, 9:52 AM Please see Amion for pager number during day hours 7:00am-4:30pm

## 2024-01-20 NOTE — Evaluation (Signed)
 Occupational Therapy Evaluation Patient Details Name: James Collier MRN: [id number] DOB: [birth year] Today's Date: 01/20/2024   History of Present Illness   68 y.o. male with medical history significant for Parkinson's disease, prostate cancer s/p prostatectomy who presented to [facility] ED via EMS with abdominal pain. Pain began this morning soon after waking and was initially mild but progressively worsened to severe pain. S/p diagnostic laparoscopy, exploratory laparotomy, small bowel resection for suspected walled off perforated small bowel diverticulum on 2/23.     Clinical Impressions Patient is s/p exploratory laparoscopy and bowel resection surgery resulting in functional limitations due to the deficits listed below (see OT Problem List). Prior to admit, pt was living at home with his wife, independent with all ADL tasks and functional mobility including driving. Patient will benefit from acute skilled OT to increase their safety and independence with ADL and functional mobility for ADL to allow facilitate discharge. Pain is pt's biggest barrier at this time and close to his baseline. I do not foresee any need for follow OT services. Recommend use of BSC at home to elevate height of toilet and decrease level of difficulty during toilet transfers. Asked pt to contact his wife to see if they have one in storage already. OT will continue to follow pt acutely.      If plan is discharge home, recommend the following:   A little help with walking and/or transfers;A little help with bathing/dressing/bathroom;Help with stairs or ramp for entrance;Assist for transportation;Assistance with cooking/housework     Functional Status Assessment   Patient has had a recent decline in their functional status and demonstrates the ability to make significant improvements in function in a reasonable and predictable amount of time.     Equipment Recommendations   BSC/3in1 (Pt will ask wife if they have a  BSC already in storage as he is unsure.)      Precautions/Restrictions   Precautions Precautions: Fall Recall of Precautions/Restrictions: Intact Precaution/Restrictions Comments: history of parkinson's Restrictions Weight Bearing Restrictions Per Provider Order: No     Mobility Bed Mobility Overal bed mobility: Needs Assistance Bed Mobility: Supine to Sit     Supine to sit: Min assist, HOB elevated, Used rails     General bed mobility comments: HOB remained elevated due to pain level. Pt utilized bed rail as well as requested 1 hand held assist to assist with bring trunk up off HOB. Increased time and effort to complete task due to pain level.    Transfers Overall transfer level: Needs assistance Equipment used: None Transfers: Sit to/from Stand, Bed to chair/wheelchair/BSC Sit to Stand: Supervision, From elevated surface     Step pivot transfers: Supervision, From elevated surface     General transfer comment: Increased time and effort to complete task due to pain level and dependent on level of sitting surface.      Balance Overall balance assessment: Mild deficits observed, not formally tested         ADL either performed or assessed with clinical judgement   ADL Overall ADL's : Needs assistance/impaired Eating/Feeding: NPO Eating/Feeding Details (indicate cue type and reason): NPO except for small sips of clear liquids/ice chips Grooming: Set up;Sitting   Upper Body Bathing: Set up;Sitting   Lower Body Bathing: Maximal assistance;Sit to/from stand   Upper Body Dressing : Set up;Sitting   Lower Body Dressing: Total assistance;Bed level Lower Body Dressing Details (indicate cue type and reason): donned hospital socks at bed level Toilet Transfer: Supervision/safety;Ambulation Toilet Transfer Details (indicate  cue type and reason): simulated Toileting- Clothing Manipulation and Hygiene: Minimal assistance;Sit to/from stand        Vision Baseline  Vision/History: 1 Wears glasses Ability to See in Adequate Light: 0 Adequate Patient Visual Report: No change from baseline Vision Assessment?: Wears glasses for reading     Perception Perception: Not tested       Praxis Praxis: Not tested       Pertinent Vitals/Pain Pain Assessment Pain Assessment: 0-10 Pain Score: 5  Pain Location: Abdomen - at rest. Increases with activity Pain Descriptors / Indicators: Grimacing, Aching, Constant Pain Intervention(s): Limited activity within patient's tolerance, Monitored during session     Extremity/Trunk Assessment Upper Extremity Assessment Upper Extremity Assessment: Left hand dominant;Overall [facility] for tasks assessed   Lower Extremity Assessment Lower Extremity Assessment: Defer to PT evaluation   Cervical / Trunk Assessment Cervical / Trunk Assessment: Other exceptions Cervical / Trunk Exceptions: recent exploratory laparoscopy   Communication Communication Communication: No apparent difficulties   Cognition Arousal: Alert Behavior During Therapy: Flat affect, WFL for tasks assessed/performed Cognition: No apparent impairments              Following commands: Intact       Cueing  General Comments   Cueing Techniques: Verbal cues              Home Living Family/patient expects to be discharged to:: Private residence Living Arrangements: Spouse/significant other Available Help at Discharge: Family;Friend(s);Available PRN/intermittently Type of Home: House Home Access: Stairs to enter [employer] of Steps: 1 Entrance Stairs-Rails: None Home Layout: One level     Bathroom Shower/Tub: Walk-in shower;Door   [employer] Toilet: Handicapped height Bathroom Accessibility: No   Home Equipment: Grab bars - tub/shower;Hand held shower head   Additional Comments: unsure what DME is at home from taking care of previous parents who have since passed.      Prior Functioning/Environment Prior Level of  Function : Independent/Modified Independent;Driving    ADLs Comments: pt is retired. pt does continue to complete yardwork.    OT Problem List: Pain;Decreased activity tolerance;Impaired balance (sitting and/or standing)   OT Treatment/Interventions: Self-care/ADL training;Therapeutic exercise;Therapeutic activities;Patient/family education;DME and/or AE instruction;Balance training;Manual therapy;Modalities      OT Goals(Current goals can be found in the care plan section)   Acute Rehab OT Goals Patient Stated Goal: to sit up in recliner OT Goal Formulation: With patient Time For Goal Achievement: 02/03/24 Potential to Achieve Goals: Good   OT Frequency:  Min 1X/week       AM-PAC OT "6 Clicks" Daily Activity     Outcome Measure Help from another person eating meals?: None Help from another person taking care of personal grooming?: None Help from another person toileting, which includes using toliet, bedpan, or urinal?: A Lot Help from another person bathing (including washing, rinsing, drying)?: A Lot Help from another person to put on and taking off regular upper body clothing?: None Help from another person to put on and taking off regular lower body clothing?: Total 6 Click Score: 17   End of Session Nurse Communication: Mobility status  Activity Tolerance: Patient tolerated treatment well;Patient limited by pain Patient left: in chair;with call bell/phone within reach;with chair alarm set  OT Visit Diagnosis: Unsteadiness on feet (R26.81);Pain Pain - Right/Left:  (N/A) Pain - part of body:  (abdomen)                Time: 9147-8295 OT Time Calculation (min): 42 min Charges:  OT General Charges $  OT Visit: 1 Visit OT Evaluation $OT Eval High Complexity: 1 High OT Treatments $Self Care/Home Management : 8-22 mins $Therapeutic Activity: 8-22 mins  Limmie Patricia, OTR/L,CBIS  Supplemental OT - [facility] and [facility] Secure Chat Preferred    Lanelle Lindo, Charisse March 01/20/2024, 12:19 PM

## 2024-01-20 NOTE — Care Management Important Message (Signed)
 Important Message  Patient Details  Name: James Collier MRN: [id number] Date of Birth: 1956-01-06   Important Message Given:  Yes - Medicare IM     Sherilyn Banker 01/20/2024, 3:48 PM

## 2024-01-20 NOTE — Progress Notes (Signed)
   01/20/24 1405  TOC Brief Assessment  Insurance and Status Reviewed  Patient has primary care physician Yes  Home environment has been reviewed wife  Prior level of function: independent  Prior/Current Home Services No current home services  Social Drivers of Health Review SDOH reviewed no interventions necessary  Transition of care needs no transition of care needs at this time     Transition of Care Department [facility]) has reviewed patient and no TOC needs have been identified at this time. We will continue to monitor patient advancement through interdisciplinary progression rounds. If new patient transition needs arise, please place a TOC consult.

## 2024-01-21 ENCOUNTER — Encounter: Payer: Self-pay | Admitting: Family Medicine

## 2024-01-21 ENCOUNTER — Inpatient Hospital Stay (HOSPITAL_COMMUNITY): Payer: Medicare PPO

## 2024-01-21 LAB — CBC
HCT: 45 % (ref 39.0–52.0)
Hemoglobin: 15.7 g/dL (ref 13.0–17.0)
MCH: 31.3 pg (ref 26.0–34.0)
MCHC: 34.9 g/dL (ref 30.0–36.0)
MCV: 89.6 fL (ref 80.0–100.0)
Platelets: 298 10*3/uL (ref 150–400)
RBC: 5.02 MIL/uL (ref 4.22–5.81)
RDW: 12.6 % (ref 11.5–15.5)
WBC: 14.5 10*3/uL — ABNORMAL HIGH (ref 4.0–10.5)
nRBC: 0 % (ref 0.0–0.2)

## 2024-01-21 LAB — BASIC METABOLIC PANEL
Anion gap: 11 (ref 5–15)
BUN: 15 mg/dL (ref 8–23)
CO2: 25 mmol/L (ref 22–32)
Calcium: 9.2 mg/dL (ref 8.9–10.3)
Chloride: 95 mmol/L — ABNORMAL LOW (ref 98–111)
Creatinine, Ser: 1.15 mg/dL (ref 0.61–1.24)
GFR, Estimated: >60 mL/min (ref >60)
Glucose, Bld: 106 mg/dL — ABNORMAL HIGH (ref 70–99)
Potassium: 3.5 mmol/L (ref 3.5–5.1)
Sodium: 131 mmol/L — ABNORMAL LOW (ref 135–145)

## 2024-01-21 LAB — SURGICAL PATHOLOGY

## 2024-01-21 MED ORDER — SODIUM CHLORIDE 0.9 % IV SOLN: INTRAVENOUS | Status: DC

## 2024-01-21 MED ORDER — LORAZEPAM 2 MG/ML IJ SOLN
0.2500 mg | Freq: Three times a day (TID) | INTRAMUSCULAR | Status: DC | PRN
  Administered 2024-01-21: 0.25 mg via INTRAVENOUS
  Filled 2024-01-21: qty 1

## 2024-01-21 MED ORDER — CARBIDOPA-LEVODOPA ER 50-200 MG PO TBCR
1.0000 | EXTENDED_RELEASE_TABLET | Freq: Every day | ORAL | Status: DC
  Administered 2024-01-22 – 2024-01-23 (×2): 1 via ORAL
  Filled 2024-01-21 (×2): qty 1

## 2024-01-21 NOTE — Progress Notes (Signed)
 3 Days Post-Op  Subjective: CC: Having some R sided abdominal pain. Patient reports nausea, burping/belching, abdominal bloating/distension that has developed since yesterday and is worse with po intake. Flatus x 1. No bm. Voiding without issues. Worked with OT yesterday. On the schedule to work with PT. Up to the chair this am.   Afebrile. HR 105. No hypotension. WBC 11.2 > 14.5. Hgb stable. Na 131. Cr 1.15 (1.09). K 3.5.   Objective: Vital signs in last 24 hours: Temp:  [97.6 F (36.4 C)-98.5 F (36.9 C)] 97.6 F (36.4 C) (02/25 0914) Pulse Rate:  [94-105] 105 (02/25 0914) Resp:  [17-18] 17 (02/25 0914) BP: (140-167)/(83-89) 140/85 (02/25 0914) SpO2:  [94 %-95 %] 95 % (02/25 0914) Last BM Date : 01/17/24  Intake/Output from previous day: 02/24 0701 - 02/25 0700 In: -  Out: 1150 [Urine:1150] Intake/Output this shift: No intake/output data recorded.  PE: Gen:  Alert, NAD, pleasant HEENT: The patient has normal phonation and is in control of secretions. No stridor.  Midline uvula without edema.  Card:  Reg Pulm:  CTAB, no W/R/R, effort normal. On RA currently.  Abd: Mild distension but soft. R sided ttp and appropriately ttp around his incision without rigidity or guarding. Some BS. Midline wound with honeycomb dressing in place with some dried blood on the inferior aspect but no evidence of active bleeding.  Ext:  No LE edema or calf tenderness  Lab Results:  Recent Labs    01/20/24 0701 01/21/24 0532  WBC 11.2* 14.5*  HGB 14.3 15.7  HCT 41.4 45.0  PLT 217 298   BMET Recent Labs    01/21/24 0532  NA 131*  K 3.5  CL 95*  CO2 25  GLUCOSE 106*  BUN 15  CREATININE 1.15  CALCIUM 9.2   PT/INR No results for input(s): "LABPROT", "INR" in the last 72 hours. CMP     Component Value Date/Time   NA 131 (L) 01/21/2024 0532   K 3.5 01/21/2024 0532   CL 95 (L) 01/21/2024 0532   CO2 25 01/21/2024 0532   GLUCOSE 106 (H) 01/21/2024 0532   BUN 15 01/21/2024 0532    CREATININE 1.15 01/21/2024 0532   CALCIUM 9.2 01/21/2024 0532   PROT 6.7 01/17/2024 0918   ALBUMIN 3.7 01/17/2024 0918   AST 19 01/17/2024 0918   ALT 11 01/17/2024 0918   ALKPHOS 49 01/17/2024 0918   BILITOT 0.9 01/17/2024 0918   GFRNONAA >60 01/21/2024 0532   GFRAA 69 (L) 05/24/2012 2015   Lipase     Component Value Date/Time   LIPASE 34 01/17/2024 0918    Studies/Results: No results found.  Anti-infectives: Anti-infectives (From admission, onward)    Start     Dose/Rate Route Frequency Ordered Stop   01/18/24 1200  piperacillin-tazobactam (ZOSYN) IVPB 3.375 g  Status:  Discontinued        3.375 g 100 mL/hr over 30 Minutes Intravenous  Once 01/18/24 1155 01/20/24 0952   01/17/24 2100  piperacillin-tazobactam (ZOSYN) IVPB 3.375 g       Placed in "Followed by" Linked Group   3.375 g 12.5 mL/hr over 240 Minutes Intravenous Every 8 hours 01/17/24 1411     01/17/24 1415  piperacillin-tazobactam (ZOSYN) IVPB 3.375 g       Placed in "Followed by" Linked Group   3.375 g 100 mL/hr over 30 Minutes Intravenous  Once 01/17/24 1411 01/17/24 1651        Assessment/Plan POD 3 s/p diagnostic laparoscopy, exploratory  laparotomy, LOA, SBR by Dr. Janee Morn 2/22 for contained perforation small bowel diverticulum - Path pending.  - Suspect developing an ileus.  Make NPO.  Will obtain x-ray.  Pending x-ray results or if patient was to have any worsening symptoms, would recommend NG tube for decompression.  - Cont abx, plan 5d post op - Mobilize, PT - Pulm toilet.  - Am labs including lytes  FEN - NPO. Restart IVF at 71ml/hr given tachycardia in the 100's and patient now NPO VTE - SCDs, Lovenox  ID - Zosyn. Afebrile. WBC 11.2 > 14.5 Foley - None, spont void  Hyponatremia - 131, IVF. Recheck in am. Remote hx of prostate ca s/p surgery Parkinson disease - home meds   LOS: 4 days    Jacinto Halim, [facility] Surgery 01/21/2024, 9:37 AM Please see Amion for pager  number during day hours 7:00am-4:30pm

## 2024-01-21 NOTE — Progress Notes (Signed)
 Mobility Specialist Progress Note:   01/21/24 1548  Mobility  Activity Refused mobility   Pt refused mobility d/t ambulating multiple times today in hallway with PT and wife. Will f/u as able.    Leory Plowman  Mobility Specialist Please contact via [employer] office at [phone]

## 2024-01-21 NOTE — Evaluation (Signed)
 Physical Therapy Evaluation Patient Details Name: VANDELL KUN MRN: [id number] DOB: 02/11/1956 Today's Date: 01/21/2024  History of Present Illness  68 y.o. male presented to [facility] ED via EMS with abdominal pain on 01/17/2024. S/p diagnostic laparoscopy, exploratory laparotomy, small bowel resection for suspected walled off perforated small bowel diverticulum on 2/23. PMH includes Parkinson's disease, prostate CA s/p prostatectomy.  Clinical Impression  Pt presents to PT with deficits in strength, power, endurance, gait, and balance. Pt is able to ambulate for limited community distances, electing to utilize support of [facility] for stability at this time. PT encourages frequent ambulation in an effort to improve balance and endurance as well as to stimulate bowel function. PT will continue to follow but anticipate no post-acute PT or DME needs.        If plan is discharge home, recommend the following:     Can travel by private vehicle        Equipment Recommendations None recommended by PT  Recommendations for Other Services       Functional Status Assessment Patient has had a recent decline in their functional status and demonstrates the ability to make significant improvements in function in a reasonable and predictable amount of time.     Precautions / Restrictions Precautions Precautions: Fall Recall of Precautions/Restrictions: Intact Precaution/Restrictions Comments: history of parkinson's Restrictions Weight Bearing Restrictions Per Provider Order: No      Mobility  Bed Mobility                    Transfers Overall transfer level: Needs assistance Equipment used: None Transfers: Sit to/from Stand Sit to Stand: Supervision                Ambulation/Gait Ambulation/Gait assistance: Supervision Gait Distance (Feet): 600 Feet Assistive device: IV Pole Gait Pattern/deviations: Step-through pattern Gait velocity: functional Gait velocity interpretation:  1.31 - 2.62 ft/sec, indicative of limited community ambulator   General Gait Details: slowed step-through gait with UE support of IV pole  Stairs            Wheelchair Mobility     Tilt Bed    Modified Rankin (Stroke Patients Only)       Balance Overall balance assessment: Needs assistance Sitting-balance support: Feet supported, No upper extremity supported Sitting balance-Leahy Scale: Good     Standing balance support: No upper extremity supported, During functional activity Standing balance-Leahy Scale: Fair                               Pertinent Vitals/Pain Pain Assessment Pain Assessment: Faces Faces Pain Scale: Hurts a little bit Pain Location: abdomen Pain Descriptors / Indicators: Sore Pain Intervention(s): Monitored during session    Home Living Family/patient expects to be discharged to:: Private residence Living Arrangements: Spouse/significant other Available Help at Discharge: Family;Friend(s);Available PRN/intermittently Type of Home: House Home Access: Stairs to enter Entrance Stairs-Rails: None Entrance Stairs-Number of Steps: 1   Home Layout: One level Home Equipment: Grab bars - tub/shower;Hand held shower head      Prior Function Prior Level of Function : Independent/Modified Independent;Driving                     Extremity/Trunk Assessment   Upper Extremity Assessment Upper Extremity Assessment: Overall WFL for tasks assessed    Lower Extremity Assessment Lower Extremity Assessment: Overall WFL for tasks assessed    Cervical / Trunk Assessment Cervical / Trunk  Assessment: Other exceptions Cervical / Trunk Exceptions: recent exploratory laparoscopy  Communication   Communication Communication: No apparent difficulties    Cognition Arousal: Alert Behavior During Therapy: WFL for tasks assessed/performed   PT - Cognitive impairments: No apparent impairments                          Following commands: Intact       Cueing Cueing Techniques: Verbal cues     General Comments General comments (skin integrity, edema, etc.): VSS on RA    Exercises     Assessment/Plan    PT Assessment Patient needs continued PT services  PT Problem List Decreased activity tolerance;Decreased balance;Decreased mobility       PT Treatment Interventions DME instruction;Gait training;Stair training;Functional mobility training;Therapeutic activities;Therapeutic exercise;Balance training;Neuromuscular re-education;Patient/family education    PT Goals (Current goals can be found in the Care Plan section)  Acute Rehab PT Goals Patient Stated Goal: to return to independence PT Goal Formulation: With patient Time For Goal Achievement: 02/04/24 Potential to Achieve Goals: Good    Frequency Min 1X/week     Co-evaluation               AM-PAC PT "6 Clicks" Mobility  Outcome Measure Help needed turning from your back to your side while in a flat bed without using bedrails?: A Little Help needed moving from lying on your back to sitting on the side of a flat bed without using bedrails?: A Little Help needed moving to and from a bed to a chair (including a wheelchair)?: A Little Help needed standing up from a chair using your arms (e.g., wheelchair or bedside chair)?: A Little Help needed to walk in hospital room?: A Little Help needed climbing 3-5 steps with a railing? : A Little 6 Click Score: 18    End of Session   Activity Tolerance: Patient tolerated treatment well Patient left: in chair;with call bell/phone within reach Nurse Communication: Mobility status PT Visit Diagnosis: Other abnormalities of gait and mobility (R26.89)    Time: 4098-1191 PT Time Calculation (min) (ACUTE ONLY): 16 min   Charges:   PT Evaluation $PT Eval Low Complexity: 1 Low   PT General Charges $$ ACUTE PT VISIT: 1 Visit         Arlyss Gandy, PT, DPT Acute Rehabilitation Office  [phone]   Arlyss Gandy 01/21/2024, 9:49 AM

## 2024-01-22 LAB — BASIC METABOLIC PANEL
Anion gap: 11 (ref 5–15)
BUN: 17 mg/dL (ref 8–23)
CO2: 28 mmol/L (ref 22–32)
Calcium: 9.2 mg/dL (ref 8.9–10.3)
Chloride: 96 mmol/L — ABNORMAL LOW (ref 98–111)
Creatinine, Ser: 1.02 mg/dL (ref 0.61–1.24)
GFR, Estimated: >60 mL/min (ref >60)
Glucose, Bld: 112 mg/dL — ABNORMAL HIGH (ref 70–99)
Potassium: 3.2 mmol/L — ABNORMAL LOW (ref 3.5–5.1)
Sodium: 135 mmol/L (ref 135–145)

## 2024-01-22 LAB — CBC
HCT: 41.6 % (ref 39.0–52.0)
Hemoglobin: 14.6 g/dL (ref 13.0–17.0)
MCH: 31.4 pg (ref 26.0–34.0)
MCHC: 35.1 g/dL (ref 30.0–36.0)
MCV: 89.5 fL (ref 80.0–100.0)
Platelets: 288 10*3/uL (ref 150–400)
RBC: 4.65 MIL/uL (ref 4.22–5.81)
RDW: 12.6 % (ref 11.5–15.5)
WBC: 11.1 10*3/uL — ABNORMAL HIGH (ref 4.0–10.5)
nRBC: 0 % (ref 0.0–0.2)

## 2024-01-22 LAB — PHOSPHORUS: Phosphorus: 2.6 mg/dL (ref 2.5–4.6)

## 2024-01-22 LAB — MAGNESIUM: Magnesium: 1.9 mg/dL (ref 1.7–2.4)

## 2024-01-22 MED ORDER — ENSURE ENLIVE PO LIQD: 237.0000 mL | Freq: Every day | ORAL | Status: DC | PRN

## 2024-01-22 MED ORDER — SODIUM CHLORIDE 0.45 % IV SOLN
INTRAVENOUS | Status: DC
  Filled 2024-01-22 (×2): qty 1000

## 2024-01-22 MED ORDER — MAGNESIUM SULFATE 2 GM/50ML IV SOLN
2.0000 g | Freq: Once | INTRAVENOUS | Status: AC
  Administered 2024-01-22: 2 g via INTRAVENOUS
  Filled 2024-01-22: qty 50

## 2024-01-22 MED ORDER — ENSURE ENLIVE PO LIQD
237.0000 mL | Freq: Three times a day (TID) | ORAL | Status: DC
  Administered 2024-01-22: 237 mL via ORAL

## 2024-01-22 MED ORDER — BOOST / RESOURCE BREEZE PO LIQD CUSTOM
1.0000 | Freq: Three times a day (TID) | ORAL | Status: DC
  Administered 2024-01-22 – 2024-01-23 (×3): 1 via ORAL

## 2024-01-22 MED ORDER — ADULT MULTIVITAMIN W/MINERALS CH
1.0000 | ORAL_TABLET | Freq: Every day | ORAL | Status: DC
  Administered 2024-01-22 – 2024-01-24 (×3): 1 via ORAL
  Filled 2024-01-22 (×3): qty 1

## 2024-01-22 MED ORDER — THIAMINE MONONITRATE 100 MG PO TABS
100.0000 mg | ORAL_TABLET | Freq: Every day | ORAL | Status: DC
  Administered 2024-01-22 – 2024-01-24 (×3): 100 mg via ORAL
  Filled 2024-01-22 (×3): qty 1

## 2024-01-22 NOTE — Progress Notes (Signed)
 Progress Note  4 Days Post-Op  Subjective: Pt having more bowel function, denies nausea or vomiting. Feels like abdomen is slightly less distended today. Has been ambulating in hallways.   Objective: Vital signs in last 24 hours: Temp:  [98.2 F (36.8 C)-99.1 F (37.3 C)] 98.5 F (36.9 C) (02/26 0902) Pulse Rate:  [84-109] 87 (02/26 0902) Resp:  [17-20] 20 (02/26 0902) BP: (134-153)/(80-90) 144/82 (02/26 0902) SpO2:  [93 %-99 %] 96 % (02/26 0902) Last BM Date : 01/17/24  Intake/Output from previous day: 02/25 0701 - 02/26 0700 In: -  Out: 550 [Urine:550] Intake/Output this shift: No intake/output data recorded.  PE: General: pleasant, WD, WN male, NAD Lungs: Respiratory effort nonlabored Abd: soft, appropriately ttp, mild distention, incisions C/D/I with staples present  Psych: A&Ox3 with an appropriate affect.    Lab Results:  Recent Labs    01/21/24 0532 01/22/24 0702  WBC 14.5* 11.1*  HGB 15.7 14.6  HCT 45.0 41.6  PLT 298 288   BMET Recent Labs    01/21/24 0532 01/22/24 0702  NA 131* 135  K 3.5 3.2*  CL 95* 96*  CO2 25 28  GLUCOSE 106* 112*  BUN 15 17  CREATININE 1.15 1.02  CALCIUM 9.2 9.2   PT/INR No results for input(s): "LABPROT", "INR" in the last 72 hours. CMP     Component Value Date/Time   NA 135 01/22/2024 0702   K 3.2 (L) 01/22/2024 0702   CL 96 (L) 01/22/2024 0702   CO2 28 01/22/2024 0702   GLUCOSE 112 (H) 01/22/2024 0702   BUN 17 01/22/2024 0702   CREATININE 1.02 01/22/2024 0702   CALCIUM 9.2 01/22/2024 0702   PROT 6.7 01/17/2024 0918   ALBUMIN 3.7 01/17/2024 0918   AST 19 01/17/2024 0918   ALT 11 01/17/2024 0918   ALKPHOS 49 01/17/2024 0918   BILITOT 0.9 01/17/2024 0918   GFRNONAA >60 01/22/2024 0702   GFRAA 69 (L) 05/24/2012 2015   Lipase     Component Value Date/Time   LIPASE 34 01/17/2024 0918       Studies/Results: DG Abd Portable 1V Result Date: 01/21/2024 CLINICAL DATA:  Abdominal pain, nausea, and  vomiting. Recent abdominal surgery 1 week ago. EXAM: PORTABLE ABDOMEN - 1 VIEW COMPARISON:  None Available. FINDINGS: Distended loops of small bowel are noted in the abdomen measuring up to 3.4 cm. Surgical clips are noted over the midline. There is mild atelectasis at the left lung base. No radio-opaque calculi or other acute radiographic abnormality are seen. IMPRESSION: Multiple distended loops of small bowel in the abdomen measuring up to 3.4 cm, possible ileus versus obstruction. Electronically Signed   By: Thornell Sartorius M.D.   On: 01/21/2024 13:50    Anti-infectives: Anti-infectives (From admission, onward)    Start     Dose/Rate Route Frequency Ordered Stop   01/18/24 1200  piperacillin-tazobactam (ZOSYN) IVPB 3.375 g  Status:  Discontinued        3.375 g 100 mL/hr over 30 Minutes Intravenous  Once 01/18/24 1155 01/20/24 0952   01/17/24 2100  piperacillin-tazobactam (ZOSYN) IVPB 3.375 g       Placed in "Followed by" Linked Group   3.375 g 12.5 mL/hr over 240 Minutes Intravenous Every 8 hours 01/17/24 1411     01/17/24 1415  piperacillin-tazobactam (ZOSYN) IVPB 3.375 g       Placed in "Followed by" Linked Group   3.375 g 100 mL/hr over 30 Minutes Intravenous  Once 01/17/24 1411 01/17/24 1651  Assessment/Plan  POD4 s/p diagnostic laparoscopy, exploratory laparotomy, LOA, SBR by Dr. Janee Morn 2/22 for contained perforation small bowel diverticulum - Path with small bowel with perforated diverticulum, no malignancy - mild ileus seems to be improving, BM overnight and this AM - start CLD ok to ADAT to FLD if doing well - Cont abx, plan 5d post op - Mobilize, PT - Pulm toilet.  Mild hypomagnesemia - 1.9, replace IV Mild hypokalemia - 3.2, replace in IV fluids   FEN - CLD ok to ADAT to FLD, replacing Mg and K, IVF@75cc /h VTE - SCDs, Lovenox  ID - Zosyn. Afebrile. WBC 11.1 Foley - None, spont void   Hyponatremia - 135, improving Remote hx of prostate ca s/p  surgery Parkinson disease - home meds   LOS: 5 days     James Collier, [facility] Surgery 01/22/2024, 10:39 AM Please see Amion for pager number during day hours 7:00am-4:30pm

## 2024-01-22 NOTE — Progress Notes (Signed)
 Occupational Therapy Treatment/Discharge Patient Details Name: James Collier MRN: [id number] DOB: Nov 24, 1956 Today's Date: 01/22/2024   History of present illness 68 y.o. male presented to [facility] ED via EMS with abdominal pain on 01/17/2024. S/p diagnostic laparoscopy, exploratory laparotomy, small bowel resection for suspected walled off perforated small bowel diverticulum on 2/23. PMH includes Parkinson's disease, prostate CA s/p prostatectomy.   OT comments  Pt up in recliner upon therapy arrival. Discussed progress since initial evaluation. Pt reports that pain is improving. He has been walking the halls and going to/from the bathroom without assistance. Discharge planning discussed while reviewing energy conservation strategies and use of DME if needed when completing ADL tasks. At this time, pt does not demonstrate any acute OT needs. All therapy goals have been met.       If plan is discharge home, recommend the following:  A little help with bathing/dressing/bathroom;Help with stairs or ramp for entrance;Assist for transportation;Assistance with cooking/housework   Equipment Recommendations  None recommended by OT       Precautions / Restrictions Precautions Precautions: Fall Recall of Precautions/Restrictions: Intact Precaution/Restrictions Comments: history of parkinson's Restrictions Weight Bearing Restrictions Per Provider Order: No       Mobility Bed Mobility   General bed mobility comments: up in recliner upon therapy arrival. pt reports that bed is not comfortable and he has been sleeping in recliner.    Transfers Overall transfer level: Modified independent Equipment used: None Transfers: Sit to/from Stand, Bed to chair/wheelchair/BSC  General transfer comment: Pt reports that he has been to/from the bathroom without assistance. He has been able to get on and off the toilet with minimal difficulty.     Balance Overall balance assessment: Modified Independent       ADL either performed or assessed with clinical judgement   ADL Overall ADL's : Modified independent;Independent;At baseline    General ADL Comments: Discussed any need for DME and/or compensatory techniques during ADL tasks when discharged home. Initially, recommended BSC to be placed over toilet at home to elevate seat. Pt declines needs this session. Reports minimal difficulty with toilet height in bathroom here and reports that he has the tub and/or counter that he can use to assist with sit<>stand transitions. Pt reports owns a shower bench.              Communication Communication Communication: No apparent difficulties   Cognition Arousal: Alert Behavior During Therapy: WFL for tasks assessed/performed Cognition: No apparent impairments          Following commands: Intact        Cueing   Cueing Techniques: Verbal cues        General Comments VSS on RA    Pertinent Vitals/ Pain       Pain Assessment Pain Assessment: Faces Faces Pain Scale: No hurt (at rest sitting in recliner)         Frequency  Min 1X/week        Progress Toward Goals  OT Goals(current goals can now be found in the care plan section)  Progress towards OT goals: Goals met/education completed, patient discharged from OT            AM-PAC OT "6 Clicks" Daily Activity     Outcome Measure   Help from another person eating meals?: None Help from another person taking care of personal grooming?: None Help from another person toileting, which includes using toliet, bedpan, or urinal?: None Help from another person bathing (including washing, rinsing, drying)?: None  Help from another person to put on and taking off regular upper body clothing?: None Help from another person to put on and taking off regular lower body clothing?: None 6 Click Score: 24    End of Session    OT Visit Diagnosis: Unsteadiness on feet (R26.81);Pain Pain - Right/Left:  (bilateral) Pain - part of body:   (abdomen)   Activity Tolerance Patient tolerated treatment well   Patient Left in chair;with call bell/phone within reach           Time: 1550-1609 OT Time Calculation (min): 19 min  Charges: OT General Charges $OT Visit: 1 Visit OT Treatments $Self Care/Home Management : 8-22 mins  James Collier, OTR/L,CBIS  Supplemental OT - [facility] and [facility] Secure Chat Preferred    James Collier, James Collier 01/22/2024, 5:11 PM

## 2024-01-22 NOTE — Plan of Care (Signed)
  Problem: Pain Managment: Goal: General experience of comfort will improve and/or be controlled Outcome: Progressing   Problem: Safety: Goal: Ability to remain free from injury will improve Outcome: Progressing   Problem: Skin Integrity: Goal: Risk for impaired skin integrity will decrease Outcome: Progressing

## 2024-01-22 NOTE — Progress Notes (Signed)
 Initial Nutrition Assessment  DOCUMENTATION CODES:   Not applicable  INTERVENTION:  Boost Breeze po TID, each supplement provides 250 kcal and 9 grams of protein Multivitamin with minerals daily 100 mg Thiamine daily  Monitor magnesium, potassium, and phosphorus daily for at least 3 days, MD to replete as needed, as pt is at risk for refeeding syndrome  Recommend starting TPN if patient does not tolerate CLD/FLD  NUTRITION DIAGNOSIS:   Increased nutrient needs related to acute illness as evidenced by estimated needs.  GOAL:   Patient will meet greater than or equal to 90% of their needs  MONITOR:   PO intake, Diet advancement, Labs, I & O's  REASON FOR ASSESSMENT:   NPO/Clear Liquid Diet    ASSESSMENT:  68 y.o male presented with abdominal pain, found to have perforation of the small bowel diverticulum now s/p ex lap, LOA, SBR. PMH of Parkinson's disease, prostate cancer s/p prostatectomy.  2/21 - CT abd/pelvis, small bowel perforation, Small volume intraperitoneal free air.  2/22 - s/p EX LAP, LOA, SBR  2/24 - X-ray Multiple distended loops of small bowel in the abdomen measuring, possible ileus versus obstruction.    Patient resting in chair, states he has been hungry for days now. He endorses good appetite and PO intake PTA. Eating 3 "big portioned" meals per day. Likes to snack on salty things. Drinks juice. Endorses no weight loss. Lives at home with his wife who makes his meals. UBW to be around 220 lbs.   Diet advanced to clears today. Patient very eager to have some broth. Provided Boost Breeze to patient, which he enjoyed and is willing to drink. Patient reports he had a bowel movement last night and this morning. Reports no abdominal pain, or N/V, will see how he feels after taking clear liquids. Ambulating in hallways.   Patient has been NPO for 5 days now, will be at moderate refeeding risk, Mag and K already low. PA supplementing. Has been getting IVF fluids. If  patient does not tolerate CLD/FLD recommend starting TPN.    Admit weight: 98.9 kg  Current weight: 98.9 kg    Average Meal Intake: NPO  Nutritionally Relevant Medications: Scheduled Meds:  carbidopa-levodopa  1 tablet Oral QHS   carbidopa-levodopa  2 tablet Oral TID   docusate sodium  100 mg Oral BID   enoxaparin (LOVENOX) injection  40 mg Subcutaneous Q24H   entacapone  200 mg Oral 3 times per day   feeding supplement  237 mL Oral TID BM   guaiFENesin  600 mg Oral BID   Continuous Infusions:  magnesium sulfate bolus IVPB 2 g (01/22/24 1040)   piperacillin-tazobactam 3.375 g (01/22/24 0459)   sodium chloride 0.45 % 1,000 mL with potassium chloride 40 mEq infusion     Labs Reviewed: Potassium 3.2, Magnesium 1.9  CBG ranges none available   NUTRITION - FOCUSED PHYSICAL EXAM:  Flowsheet Row Most Recent Value  Orbital Region No depletion  Upper Arm Region No depletion  Thoracic and Lumbar Region No depletion  Buccal Region No depletion  Temple Region No depletion  Clavicle Bone Region Mild depletion  Clavicle and Acromion Bone Region Mild depletion  Scapular Bone Region No depletion  Dorsal Hand No depletion  Patellar Region No depletion  Anterior Thigh Region No depletion  Posterior Calf Region No depletion  Edema (RD Assessment) None  Hair Reviewed  Mouth Reviewed  Skin Reviewed  Nails Reviewed       Diet Order:   Diet Order  Diet clear liquid Room service appropriate? Yes; Fluid consistency: Thin  Diet effective now                   EDUCATION NEEDS:   Education needs have been addressed  Skin:  Skin Assessment: Skin Integrity Issues: Skin Integrity Issues:: Incisions Incisions: Abdomen  Last BM:  01/22/2024 type 6  Height:   Ht Readings from Last 1 Encounters:  01/18/24 5\' 10"  (1.778 m)    Weight:   Wt Readings from Last 1 Encounters:  01/22/24 98.9 kg    Ideal Body Weight:  75.45 kg  BMI:  Body mass index is 31.28  kg/m.  Estimated Nutritional Needs:   Kcal:  2200-2400 kcal  Protein:  105-125 gm  Fluid:  >2L/day   Elliot Dally, RD Registered Dietitian  See Amion for more information

## 2024-01-22 NOTE — Progress Notes (Signed)
 Mobility Specialist Progress Note:    01/22/24 1400  Mobility  Activity Ambulated with assistance in hallway  Level of Assistance Standby assist, set-up cues, supervision of patient - no hands on  Assistive Device Other (Comment) (IV Pole)  Distance Ambulated (ft) 550 ft  Activity Response Tolerated well  Mobility Referral Yes  Mobility visit 1 Mobility  Mobility Specialist Start Time (ACUTE ONLY) 1348  Mobility Specialist Stop Time (ACUTE ONLY) 1400  Mobility Specialist Time Calculation (min) (ACUTE ONLY) 12 min   Pt received in chair and agreeable. Required no physical assistance throughout. Asymptomatic w/ no complaints. Pt left in chair with call bell and all needs met.  D'Vante Earlene Plater Mobility Specialist Please contact via Special educational needs teacher or Rehab office at [phone]

## 2024-01-23 LAB — CBC
HCT: 36.7 % — ABNORMAL LOW (ref 39.0–52.0)
Hemoglobin: 12.9 g/dL — ABNORMAL LOW (ref 13.0–17.0)
MCH: 31.8 pg (ref 26.0–34.0)
MCHC: 35.1 g/dL (ref 30.0–36.0)
MCV: 90.4 fL (ref 80.0–100.0)
Platelets: 255 10*3/uL (ref 150–400)
RBC: 4.06 MIL/uL — ABNORMAL LOW (ref 4.22–5.81)
RDW: 12.6 % (ref 11.5–15.5)
WBC: 8.9 10*3/uL (ref 4.0–10.5)
nRBC: 0 % (ref 0.0–0.2)

## 2024-01-23 LAB — BASIC METABOLIC PANEL
Anion gap: 9 (ref 5–15)
BUN: 11 mg/dL (ref 8–23)
CO2: 29 mmol/L (ref 22–32)
Calcium: 8.9 mg/dL (ref 8.9–10.3)
Chloride: 98 mmol/L (ref 98–111)
Creatinine, Ser: 1 mg/dL (ref 0.61–1.24)
GFR, Estimated: >60 mL/min (ref >60)
Glucose, Bld: 112 mg/dL — ABNORMAL HIGH (ref 70–99)
Potassium: 3.9 mmol/L (ref 3.5–5.1)
Sodium: 136 mmol/L (ref 135–145)

## 2024-01-23 MED ORDER — ENSURE MAX PROTEIN PO LIQD
11.0000 [oz_av] | Freq: Three times a day (TID) | ORAL | Status: DC
  Administered 2024-01-23 (×2): 11 [oz_av] via ORAL
  Filled 2024-01-23 (×4): qty 330

## 2024-01-23 NOTE — Progress Notes (Signed)
 Progress Note  5 Days Post-Op  Subjective: Pt tolerating FLD without nausea, vomiting, pain or distention. Having bowel movements. Pain well controlled. Ambulating well.   Objective: Vital signs in last 24 hours: Temp:  [98.2 F (36.8 C)-99.8 F (37.7 C)] 99.8 F (37.7 C) (02/27 0442) Pulse Rate:  [71-99] 71 (02/27 0442) Resp:  [16-20] 16 (02/27 0442) BP: (112-144)/(63-82) 112/63 (02/27 0442) SpO2:  [95 %-98 %] 95 % (02/27 0442) Weight:  [98.9 kg] 98.9 kg (02/26 1057) Last BM Date : 01/22/24  Intake/Output from previous day: 02/26 0701 - 02/27 0700 In: 1153.2 [P.O.:720; IV Piggyback:433.2] Out: 850 [Urine:850] Intake/Output this shift: No intake/output data recorded.  PE: General: pleasant, WD, WN male, NAD Lungs: Respiratory effort nonlabored Abd: soft, appropriately ttp, mild distention, incisions C/D/I with staples present  Psych: A&Ox3 with an appropriate affect.    Lab Results:  Recent Labs    01/22/24 0702 01/23/24 0627  WBC 11.1* 8.9  HGB 14.6 12.9*  HCT 41.6 36.7*  PLT 288 255   BMET Recent Labs    01/22/24 0702 01/23/24 0627  NA 135 136  K 3.2* 3.9  CL 96* 98  CO2 28 29  GLUCOSE 112* 112*  BUN 17 11  CREATININE 1.02 1.00  CALCIUM 9.2 8.9   PT/INR No results for input(s): "LABPROT", "INR" in the last 72 hours. CMP     Component Value Date/Time   NA 136 01/23/2024 0627   K 3.9 01/23/2024 0627   CL 98 01/23/2024 0627   CO2 29 01/23/2024 0627   GLUCOSE 112 (H) 01/23/2024 0627   BUN 11 01/23/2024 0627   CREATININE 1.00 01/23/2024 0627   CALCIUM 8.9 01/23/2024 0627   PROT 6.7 01/17/2024 0918   ALBUMIN 3.7 01/17/2024 0918   AST 19 01/17/2024 0918   ALT 11 01/17/2024 0918   ALKPHOS 49 01/17/2024 0918   BILITOT 0.9 01/17/2024 0918   GFRNONAA >60 01/23/2024 0627   GFRAA 69 (L) 05/24/2012 2015   Lipase     Component Value Date/Time   LIPASE 34 01/17/2024 0918       Studies/Results: DG Abd Portable 1V Result Date:  01/21/2024 CLINICAL DATA:  Abdominal pain, nausea, and vomiting. Recent abdominal surgery 1 week ago. EXAM: PORTABLE ABDOMEN - 1 VIEW COMPARISON:  None Available. FINDINGS: Distended loops of small bowel are noted in the abdomen measuring up to 3.4 cm. Surgical clips are noted over the midline. There is mild atelectasis at the left lung base. No radio-opaque calculi or other acute radiographic abnormality are seen. IMPRESSION: Multiple distended loops of small bowel in the abdomen measuring up to 3.4 cm, possible ileus versus obstruction. Electronically Signed   By: Thornell Sartorius M.D.   On: 01/21/2024 13:50    Anti-infectives: Anti-infectives (From admission, onward)    Start     Dose/Rate Route Frequency Ordered Stop   01/18/24 1200  piperacillin-tazobactam (ZOSYN) IVPB 3.375 g  Status:  Discontinued        3.375 g 100 mL/hr over 30 Minutes Intravenous  Once 01/18/24 1155 01/20/24 0952   01/17/24 2100  piperacillin-tazobactam (ZOSYN) IVPB 3.375 g  Status:  Discontinued       Placed in "Followed by" Linked Group   3.375 g 12.5 mL/hr over 240 Minutes Intravenous Every 8 hours 01/17/24 1411 01/23/24 0848   01/17/24 1415  piperacillin-tazobactam (ZOSYN) IVPB 3.375 g       Placed in "Followed by" Linked Group   3.375 g 100 mL/hr over 30 Minutes  Intravenous  Once 01/17/24 1411 01/17/24 1651        Assessment/Plan  POD5 s/p diagnostic laparoscopy, exploratory laparotomy, LOA, SBR by Dr. Janee Morn 2/22 for contained perforation small bowel diverticulum - Path with small bowel with perforated diverticulum, no malignancy - discussed with patient  - tolerating FLD - advance to soft diet - DC abx - ok to shower - Mobilize, PT - Pulm toilet.  Mild hypomagnesemia - replaced 2/26 Mild hypokalemia - 3.9, improved   FEN - soft diet, SLIV VTE - SCDs, Lovenox  ID - Zosyn 2/21>2/27. Afebrile. WBC normalized Foley - None, spont void   Hyponatremia - 136, resolved Remote hx of prostate ca s/p  surgery Parkinson disease - home meds   LOS: 6 days     Juliet Rude, [facility] 01/23/2024, 8:49 AM Please see Amion for pager number during day hours 7:00am-4:30pm

## 2024-01-23 NOTE — Progress Notes (Signed)
 Mobility Specialist Progress Note:   01/23/24 1250  Mobility  Activity Ambulated with assistance in hallway  Level of Assistance Standby assist, set-up cues, supervision of patient - no hands on  Assistive Device Front wheel walker  Distance Ambulated (ft) 700 ft  Activity Response Tolerated well  Mobility Referral Yes  Mobility visit 1 Mobility  Mobility Specialist Start Time (ACUTE ONLY) 1240  Mobility Specialist Stop Time (ACUTE ONLY) 1251  Mobility Specialist Time Calculation (min) (ACUTE ONLY) 11 min   Pt received in chair, eager to mobility. Denied any discomfort during ambulation, asx throughout. Pt returned to chair with call bell in reach and all needs met.   Leory Plowman  Mobility Specialist Please contact via [employer] office at [phone]

## 2024-01-23 NOTE — Plan of Care (Signed)
   Problem: Clinical Measurements: Goal: Will remain free from infection Outcome: Progressing   Problem: Activity: Goal: Risk for activity intolerance will decrease Outcome: Progressing   Problem: Coping: Goal: Level of anxiety will decrease Outcome: Progressing   Problem: Safety: Goal: Ability to remain free from injury will improve Outcome: Progressing   Problem: Skin Integrity: Goal: Risk for impaired skin integrity will decrease Outcome: Progressing

## 2024-01-23 NOTE — Progress Notes (Signed)
 Physical Therapy Treatment Patient Details Name: NASEER HEARN MRN: [id number] DOB: 06-08-1956 Today's Date: 01/23/2024   History of Present Illness 68 y.o. male presented to [facility] ED via EMS with abdominal pain on 01/17/2024. S/p diagnostic laparoscopy, exploratory laparotomy, small bowel resection for suspected walled off perforated small bowel diverticulum on 2/23. PMH includes Parkinson's disease, prostate CA s/p prostatectomy.    PT Comments  Patient making excellent progress with mobility. Agreeable to participate in therapy visit. Pt amb ~400' with RW and no cues or assist, pt steady and managing RW at Mod Ind level. Pt completed stair negotiation with single rail and alternating step pattern. No LOB noted, pt ascend/descend 1 flight. EOS discussed pt's mobility level and encouraged to amb with family and MS and RN/NT staff. Pt has no further skilled acute PT needs. Will sign off at this time.    If plan is discharge home, recommend the following:     Can travel by private vehicle        Equipment Recommendations  None recommended by PT    Recommendations for Other Services       Precautions / Restrictions Precautions Precautions: Fall Recall of Precautions/Restrictions: Intact Precaution/Restrictions Comments: history of parkinson's Restrictions Weight Bearing Restrictions Per Provider Order: No     Mobility  Bed Mobility Overal bed mobility: Needs Assistance             General bed mobility comments: pt OOB in recliner    Transfers Overall transfer level: Modified independent Equipment used: None, Rolling walker (2 wheels) Transfers: Sit to/from Stand Sit to Stand: Modified independent (Device/Increase time)           General transfer comment: use of hands to rise and lower. RW there but pt does not need for static standing.    Ambulation/Gait Ambulation/Gait assistance: Modified independent (Device/Increase time) Gait Distance (Feet): 400  Feet Assistive device: Rolling walker (2 wheels) Gait Pattern/deviations: Step-through pattern Gait velocity: functional     General Gait Details: pace cautious and steady, light assist on RW. no overt LOB noted.   Stairs Stairs: Yes Stairs assistance: Supervision Stair Management: One rail Right, Alternating pattern, Forwards Number of Stairs: 12 General stair comments: pt steady with alternating step patter, educated on step to pattern for safety. no LOB noted.   Wheelchair Mobility     Tilt Bed    Modified Rankin (Stroke Patients Only)       Balance Overall balance assessment: Modified Independent Sitting-balance support: Feet supported, No upper extremity supported Sitting balance-Leahy Scale: Good     Standing balance support: Bilateral upper extremity supported, No upper extremity supported, During functional activity Standing balance-Leahy Scale: Fair                              Hotel manager: No apparent difficulties  Cognition Arousal: Alert Behavior During Therapy: WFL for tasks assessed/performed   PT - Cognitive impairments: No apparent impairments                         Following commands: Intact      Cueing Cueing Techniques: Verbal cues  Exercises      General Comments        Pertinent Vitals/Pain Pain Assessment Pain Assessment: No/denies pain Pain Intervention(s): Limited activity within patient's tolerance, Monitored during session    Home Living  Prior Function            PT Goals (current goals can now be found in the care plan section) Acute Rehab PT Goals Patient Stated Goal: to return to independence PT Goal Formulation: With patient Time For Goal Achievement: 02/04/24 Potential to Achieve Goals: Good Progress towards PT goals: Progressing toward goals;Goals met/education completed, patient discharged from PT    Frequency    Min  1X/week      PT Plan      Co-evaluation              AM-PAC PT "6 Clicks" Mobility   Outcome Measure  Help needed turning from your back to your side while in a flat bed without using bedrails?: A Little Help needed moving from lying on your back to sitting on the side of a flat bed without using bedrails?: A Little Help needed moving to and from a bed to a chair (including a wheelchair)?: A Little Help needed standing up from a chair using your arms (e.g., wheelchair or bedside chair)?: A Little Help needed to walk in hospital room?: A Little Help needed climbing 3-5 steps with a railing? : A Little 6 Click Score: 18    End of Session Equipment Utilized During Treatment: Gait belt Activity Tolerance: Patient tolerated treatment well Patient left: in chair;with call bell/phone within reach Nurse Communication: Mobility status PT Visit Diagnosis: Other abnormalities of gait and mobility (R26.89)     Time: 1350-1407 PT Time Calculation (min) (ACUTE ONLY): 17 min  Charges:    $Gait Training: 8-22 mins PT General Charges $$ ACUTE PT VISIT: 1 Visit                     Wynn Maudlin, DPT Acute Rehabilitation Services Office [phone]  01/23/24 3:23 PM

## 2024-01-23 NOTE — Discharge Instructions (Signed)

## 2024-01-23 NOTE — Progress Notes (Signed)
 Nutrition Follow-up  DOCUMENTATION CODES:   Not applicable  INTERVENTION:  Ensure Max po BID, each supplement provides 150 kcal and 30 grams of protein.  Multivitamin with minerals daily 100 mg Thiamine daily  Provided low fiber diet education and handouts  NUTRITION DIAGNOSIS:   Increased nutrient needs related to acute illness as evidenced by estimated needs.  - Still applicable   GOAL:   Patient will meet greater than or equal to 90% of their needs  - Progressing   MONITOR:   PO intake, Diet advancement, Labs, I & O's  REASON FOR ASSESSMENT:   NPO/Clear Liquid Diet    ASSESSMENT:   68 y.o male presented with abdominal pain, found to have perforation of the small bowel diverticulum now s/p ex lap, LOA, SBR. PMH of Parkinson's disease, prostate cancer s/p prostatectomy.   RD consulted at the request of the patient for nutrition education regarding low fiber diet.   RD provided  "Low Fiber Nutrition Therapy" handout from the [employer]. Reviewed home diet with pt and suggested ways to meet nutrition goals over the next several weeks. Explained reasons to follow Low Fiber diet for the next 4-6 weeks and discussed ways to achieve. Encouraged protein intake and provided protein sources as well as portien supplements for home use. Encouraged fluid intake. Discussed symptom management should abdominal pain occur.   Pt verbalizes understanding of information provided. Expect Good compliance.  Patient advanced to GI soft diet today. Patient had 75% of his breakfast consisting of eggs, potatoes, and 1 pancake. No N/V or abdominal pain noted, having Bowel movements. Drinking Boost Breeze, now switched to Ensure Max.   ,Admit weight: 98.9 kg Current weight: 98.9 kg   Average Meal Intake: 2/27-2/27: 50% intake x 1 recorded meals  Nutritionally Relevant Medications: Scheduled Meds:  carbidopa-levodopa  1 tablet Oral QHS   carbidopa-levodopa  2 tablet  Oral TID   docusate sodium  100 mg Oral BID   enoxaparin (LOVENOX) injection  40 mg Subcutaneous Q24H   entacapone  200 mg Oral 3 times per day   guaiFENesin  600 mg Oral BID   multivitamin with minerals  1 tablet Oral Daily   Ensure Max Protein  11 oz Oral TID PC   thiamine  100 mg Oral Daily   Labs Reviewed: No pertinent labs   Diet Order:   Diet Order             DIET SOFT Room service appropriate? Yes; Fluid consistency: Thin  Diet effective now                   EDUCATION NEEDS:   Education needs have been addressed  Skin:  Skin Assessment: Skin Integrity Issues: Skin Integrity Issues:: Incisions Incisions: Abdomen  Last BM:  01/22/24 type 6  Height:   Ht Readings from Last 1 Encounters:  01/18/24 5\' 10"  (1.778 m)    Weight:   Wt Readings from Last 1 Encounters:  01/22/24 98.9 kg    Ideal Body Weight:  75.45 kg  BMI:  Body mass index is 31.28 kg/m.  Estimated Nutritional Needs:   Kcal:  2200-2400 kcal  Protein:  105-125 gm  Fluid:  >2L/day   Elliot Dally, RD Registered Dietitian  See Amion for more information

## 2024-01-24 LAB — CREATININE, SERUM
Creatinine, Ser: 0.89 mg/dL (ref 0.61–1.24)
GFR, Estimated: >60 mL/min (ref >60)

## 2024-01-24 MED ORDER — DOCUSATE SODIUM 100 MG PO CAPS: 100.0000 mg | ORAL_CAPSULE | Freq: Every day | ORAL | Status: AC | PRN

## 2024-01-24 NOTE — Discharge Summary (Signed)
 [facility] Discharge Summary   Patient ID: James Collier MRN: [id number] DOB/AGE: [birth year] 68 y.o.  Admit date: 01/17/2024 Discharge date: 01/24/2024  Admitting Diagnosis: Small bowel perforation   Discharge Diagnosis S/P small bowel resection   Consultants None   Imaging: No results found.  Procedures Dr. Violeta Gelinas (01/18/24) - Laparoscopy, exploratory laparotomy, LOA x15 minutes, small bowel resection   Hospital Course:  Patient is a 68 year old male who presented to the ED via EMS with abdominal pain.  Workup showed intraabdominal abscess with concern for small bowel perforation.  Patient was admitted and observed for 24 hrs but with worsening pain decision made to proceed to the OR. He underwent procedure listed above.  Tolerated procedure well and was transferred to the floor.  Diet was advanced as tolerated.  On POD6, the patient was voiding well, tolerating diet, ambulating well, pain well controlled, vital signs stable, incision c/d/i and felt stable for discharge home.  Patient will follow up in our office as outlined below and is aware to call with questions or concerns.   Physical Exam: General: pleasant, WD, WN male, NAD Lungs: Respiratory effort nonlabored Abd: soft, appropriately ttp, mild distention, incision C/D/I with staples present  Psych: A&Ox3 with an appropriate affect.   I or a member of my team have reviewed this patient in the Controlled Substance Database.   Allergies as of 01/24/2024       Reactions   Aloe    rash        Medication List     STOP taking these medications    amoxicillin-clavulanate 875-125 MG tablet Commonly known as: AUGMENTIN   ibuprofen 200 MG tablet Commonly known as: ADVIL       TAKE these medications    acetaminophen 500 MG tablet Commonly known as: TYLENOL Take 500 mg by mouth every 6 (six) hours as needed for mild pain (pain score 1-3) or moderate pain (pain score 4-6).   albuterol  108 (90 Base) MCG/ACT inhaler Commonly known as: VENTOLIN HFA Inhale 2 puffs into the lungs every 4 (four) hours as needed for wheezing or shortness of breath.   carbidopa-levodopa 50-200 MG tablet Commonly known as: SINEMET CR TAKE 1 TABLET BY MOUTH EVERYDAY AT BEDTIME What changed: Another medication with the same name was changed. Make sure you understand how and when to take each.   carbidopa-levodopa 25-100 MG tablet Commonly known as: SINEMET IR 2 at 7am, 2 at 11am, 2 at 4pm What changed:  how much to take how to take this when to take this additional instructions   cetirizine 10 MG tablet Commonly known as: ZYRTEC Take 10 mg by mouth daily.   docusate sodium 100 MG capsule Commonly known as: COLACE Take 1 capsule (100 mg total) by mouth daily as needed for mild constipation.   entacapone 200 MG tablet Commonly known as: COMTAN Take 1 tablet (200 mg total) by mouth 3 (three) times daily. 7am/11am/4pm with carbidopa/levodopa What changed:  when to take this additional instructions   FAMOTIDINE PO Take 1 tablet by mouth daily as needed.   PRESERVISION AREDS 2 PO Take 1 tablet by mouth in the morning and at bedtime.          Follow-up Information     [facility], [facility]. Go on 01/31/2024.   Specialty: General Surgery Why: 9:30 AM For staple removal, this will be an RN visit. Please arrive 30 min prior to appointment time to check in. Contact information: [address] [place] [address] [phone]         Violeta Gelinas, MD. Go on 02/25/2024.   Specialty: General Surgery Why: 2:00 PM, please arrive 15 min prior to appointment time to check in. Contact information: [address] [place] [address] [phone]                 Signed: Juliet Rude , [facility] 01/24/2024, 8:38 AM Please see Amion for pager number during day hours 7:00am-4:30pm

## 2024-01-24 NOTE — Plan of Care (Signed)

## 2024-02-04 ENCOUNTER — Ambulatory Visit: Admitting: Family Medicine

## 2024-02-04 ENCOUNTER — Encounter: Payer: Self-pay | Admitting: Family Medicine

## 2024-02-04 VITALS — BP 120/72 | HR 75 | Temp 98.0°F | Ht 70.0 in | Wt 218.5 lb

## 2024-02-04 DIAGNOSIS — K631 Perforation of intestine (nontraumatic): Secondary | ICD-10-CM

## 2024-02-04 DIAGNOSIS — Z961 Presence of intraocular lens: Secondary | ICD-10-CM | POA: Diagnosis not present

## 2024-02-04 DIAGNOSIS — J309 Allergic rhinitis, unspecified: Secondary | ICD-10-CM | POA: Diagnosis not present

## 2024-02-04 DIAGNOSIS — D3131 Benign neoplasm of right choroid: Secondary | ICD-10-CM | POA: Diagnosis not present

## 2024-02-04 DIAGNOSIS — R0982 Postnasal drip: Secondary | ICD-10-CM | POA: Diagnosis not present

## 2024-02-04 DIAGNOSIS — H353132 Nonexudative age-related macular degeneration, bilateral, intermediate dry stage: Secondary | ICD-10-CM | POA: Diagnosis not present

## 2024-02-04 DIAGNOSIS — H43823 Vitreomacular adhesion, bilateral: Secondary | ICD-10-CM | POA: Diagnosis not present

## 2024-02-04 MED ORDER — AZELASTINE HCL 0.1 % NA SOLN: 2.0000 | Freq: Two times a day (BID) | NASAL | 5 refills | Status: AC

## 2024-02-05 NOTE — Progress Notes (Signed)
 Established Patient Office Visit  Subjective   Patient ID: James Collier, male    DOB: 1956/07/19  Age: 68 y.o. MRN: [id number]  Chief Complaint  Patient presents with   Nasal Congestion   Hospitalization Follow-up    Patient was seen in the hospital on 2/21 for bowel perforation     HPI   Seen today for some persistent postnasal drip symptoms with mostly clear mucus and also for hospital follow-up following recent bowel perforation.  He had been seen here February 14 with chronic sinusitis type symptoms and was placed on Augmentin.  About a week later developed some abdominal pain.  Workup showed intra-abdominal abscess with concern for small bowel perforation.  Was admitted for observation but had worsening pain and decision was made to proceed with surgery.  Had uncomplicated surgery.  Etiology of perforation was not totally clear.  He feels like he is back to baseline right now with regard to appetite.  No fever.  Incisions are healing well.  He relates nasal congestion.  Has tried antihistamines including Zyrtec and Claritin without relief.  Has also has tried Flonase without improvement.  Symptoms are worse at night.  No facial pain.  No headaches.  No fever.  Past Medical History:  Diagnosis Date   Allergy    Blood in stool    Cancer (HCC) 11/26/2010   prostate   Parkinson disease [facility])    Past Surgical History:  Procedure Laterality Date   CATARACT EXTRACTION Right    HEMORROIDECTOMY  1986   LAPAROSCOPY N/A 01/18/2024   Procedure: ATTEMPTED LAPAROSCOPY CONVERTED TO OPEN DIAGNOSTIC LAPAROTOMY WITH BOWEL RESCTION;  Surgeon: Violeta Gelinas, MD;  Location: [facility] OR;  Service: General;  Laterality: N/A;   PROSTATE SURGERY  2011   TONSILLECTOMY  1965    reports that he has never smoked. He has never used smokeless tobacco. He reports current alcohol use of about 3.0 standard drinks of alcohol per week. He reports that he does not use drugs. family history includes Arthritis  in his father; Cancer in his maternal aunt, maternal grandmother, mother, and paternal grandmother; Ovarian cancer in his paternal grandmother; Parkinson's disease in his father. Allergies  Allergen Reactions   Aloe     rash    Review of Systems  Constitutional:  Negative for chills and fever.  HENT:  Positive for congestion. Negative for sinus pain.   Respiratory:  Negative for cough.   Cardiovascular:  Negative for chest pain.  Gastrointestinal:  Negative for abdominal pain, constipation, diarrhea, nausea and vomiting.      Objective:     BP 120/72 (BP Location: Left Arm, Patient Position: Sitting, Cuff Size: Normal)   Pulse 75   Temp 98 F (36.7 C) (Oral)   Ht 5\' 10"  (1.778 m)   Wt 218 lb 8 oz (99.1 kg)   SpO2 96%   BMI 31.35 kg/m  BP Readings from Last 3 Encounters:  02/04/24 120/72  01/24/24 122/61  01/10/24 130/70   Wt Readings from Last 3 Encounters:  02/04/24 218 lb 8 oz (99.1 kg)  01/22/24 218 lb (98.9 kg)  01/10/24 231 lb 1.6 oz (104.8 kg)      Physical Exam Vitals reviewed.  Constitutional:      General: He is not in acute distress.    Appearance: He is not ill-appearing.  HENT:     Nose:     Comments: Some clear mucus both nares.  No nasal polyps.  No purulent secretions noted.  Mouth/Throat:     Mouth: Mucous membranes are moist.     Pharynx: Oropharynx is clear.  Cardiovascular:     Rate and Rhythm: Normal rate and regular rhythm.  Pulmonary:     Effort: Pulmonary effort is normal.     Breath sounds: Normal breath sounds. No wheezing or rales.  Neurological:     Mental Status: He is alert.      No results found for any visits on 02/04/24.  Last CBC Lab Results  Component Value Date   WBC 8.9 01/23/2024   HGB 12.9 (L) 01/23/2024   HCT 36.7 (L) 01/23/2024   MCV 90.4 01/23/2024   MCH 31.8 01/23/2024   RDW 12.6 01/23/2024   PLT 255 01/23/2024   Last metabolic panel Lab Results  Component Value Date   GLUCOSE 112 (H) 01/23/2024    NA 136 01/23/2024   K 3.9 01/23/2024   CL 98 01/23/2024   CO2 29 01/23/2024   BUN 11 01/23/2024   CREATININE 0.89 01/24/2024   GFRNONAA >60 01/24/2024   CALCIUM 8.9 01/23/2024   PHOS 2.6 01/22/2024   PROT 6.7 01/17/2024   ALBUMIN 3.7 01/17/2024   BILITOT 0.9 01/17/2024   ALKPHOS 49 01/17/2024   AST 19 01/17/2024   ALT 11 01/17/2024   ANIONGAP 9 01/23/2024      The 10-year ASCVD risk score (Arnett DK, et al., 2019) is: 16.1%    Assessment & Plan:   #1 persistent postnasal drip symptoms.  Not improved with nasal steroids or antihistamines.  Consider trial of Astelin nasal spray 1 to 2 sprays per nostril twice daily as needed.  If not clearing with this adequately consider trial of Singulair  #2 recent small bowel perforation.  Etiology not clear.  No evidence for malignancy.  Patient has done well following surgery.  Did lose quite a bit of weight but appetite has returned.  He actually hopes to keep off weight loss that he had from the surgery  We spent 35 minutes between face-to-face and non-face-to-face time reviewing recent hospitalization, labs, x-rays  Evelena Peat, MD

## 2024-02-06 ENCOUNTER — Other Ambulatory Visit: Payer: Self-pay | Admitting: Neurology

## 2024-02-06 DIAGNOSIS — G20A1 Parkinson's disease without dyskinesia, without mention of fluctuations: Secondary | ICD-10-CM

## 2024-02-28 ENCOUNTER — Other Ambulatory Visit: Payer: Self-pay | Admitting: Neurology

## 2024-02-28 DIAGNOSIS — G20A1 Parkinson's disease without dyskinesia, without mention of fluctuations: Secondary | ICD-10-CM

## 2024-03-06 ENCOUNTER — Ambulatory Visit: Payer: Self-pay

## 2024-03-06 MED ORDER — MONTELUKAST SODIUM 10 MG PO TABS: ORAL_TABLET | ORAL | 1 refills | Status: DC

## 2024-03-06 NOTE — Addendum Note (Signed)
 Addended by: Christy Sartorius on: 03/06/2024 04:05 PM   Modules accepted: Orders

## 2024-03-06 NOTE — Telephone Encounter (Signed)
 Reason for Triage: Patient seen Dr. Caryl Never 02/04/24 and says his sinus infection isn't getting any better, medications are not working - he's sill having congestion, cough, nasal drip and it's hard for him to sleep. Please call [phone]. He doesn't know if he should come in for appt        Chief Complaint: Continues to have thick, white mucus from nose and coughing up. Seen 02/04/24. Using nasal spray, Mucinex, Nyquil has not helped much. "I just can't clear it." Asking what else he can take. Symptoms: Above Frequency: Several Pertinent Negatives: Patient denies fever Disposition: [] ED /[] Urgent Care (no appt availability in office) / [] Appointment(In office/virtual)/ []  [facility]/ [] Home Care/ [] Refused Recommended Disposition /[] [facility]/ [x]  Follow-up with PCP Additional Notes: Please advise pt.  Reason for Disposition  [1] Sinus congestion (pressure, fullness) AND [2] present > 10 days  Answer Assessment - Initial Assessment Questions 1. LOCATION: "Where does it hurt?"      Head 2. ONSET: "When did the sinus pain start?"  (e.g., hours, days)      March 3. SEVERITY: "How bad is the pain?"   (Scale 1-10; mild, moderate or severe)   - MILD (1-3): doesn't interfere with normal activities    - MODERATE (4-7): interferes with normal activities (e.g., work or school) or awakens from sleep   - SEVERE (8-10): excruciating pain and patient unable to do any normal activities        Moderate 4. RECURRENT SYMPTOM: "Have you ever had sinus problems before?" If Yes, ask: "When was the last time?" and "What happened that time?"      yes 5. NASAL CONGESTION: "Is the nose blocked?" If Yes, ask: "Can you open it or must you breathe through your mouth?"     no 6. NASAL DISCHARGE: "Do you have discharge from your nose?" If so ask, "What color?"     Thick, white mucus 7. FEVER: "Do you have a fever?" If Yes, ask: "What is it, how was it measured, and when did it start?"       no 8. OTHER SYMPTOMS: "Do you have any other symptoms?" (e.g., sore throat, cough, earache, difficulty breathing)     Mucus gets caught in throat, cough 9. PREGNANCY: "Is there any chance you are pregnant?" "When was your last menstrual period?"     N/a  Protocols used: Sinus Pain or Congestion-A-AH

## 2024-03-06 NOTE — Telephone Encounter (Signed)
 Patient reprted eh has tried nasal spray with no relief. Rx sent for Singulair

## 2024-03-24 DIAGNOSIS — D3131 Benign neoplasm of right choroid: Secondary | ICD-10-CM | POA: Diagnosis not present

## 2024-03-24 DIAGNOSIS — Z961 Presence of intraocular lens: Secondary | ICD-10-CM | POA: Diagnosis not present

## 2024-03-24 DIAGNOSIS — H353132 Nonexudative age-related macular degeneration, bilateral, intermediate dry stage: Secondary | ICD-10-CM | POA: Diagnosis not present

## 2024-05-05 ENCOUNTER — Ambulatory Visit (INDEPENDENT_AMBULATORY_CARE_PROVIDER_SITE_OTHER): Admitting: Family Medicine

## 2024-05-05 ENCOUNTER — Encounter: Payer: Self-pay | Admitting: Family Medicine

## 2024-05-05 VITALS — BP 136/86 | HR 77 | Temp 98.1°F | Wt 218.5 lb

## 2024-05-05 DIAGNOSIS — R059 Cough, unspecified: Secondary | ICD-10-CM

## 2024-05-05 DIAGNOSIS — J988 Other specified respiratory disorders: Secondary | ICD-10-CM | POA: Diagnosis not present

## 2024-05-05 MED ORDER — PANTOPRAZOLE SODIUM 40 MG PO TBEC: 40.0000 mg | DELAYED_RELEASE_TABLET | Freq: Every day | ORAL | 1 refills | Status: DC

## 2024-05-05 NOTE — Progress Notes (Signed)
 Established Patient Office Visit  Subjective   Patient ID: James Collier, male    DOB: 1956/05/27  Age: 68 y.o. MRN: [id number]  Chief Complaint  Patient presents with   Cough    HPI   Fard is seen with some persistent symptoms of increased mucus in his throat especially at night.  He is very regular with eating around 6 PM and generally does not lay down until 10 PM.  He describes increase "phlegm "at night frequently throughout the night and this occurs also early in the morning.  Usually by around 9 to 10 AM he feels like his throat is fairly cleared out.  Occasional cough but more than anything frequent clearing of throat.  We have contemplated allergies in the past he has tried several things including Astelin  and Flonase without much benefit.  Does not feel like he is having significant postnasal drip.  No obvious wheezing.  Appetite and weight stable.  They do have a couple of pets that sleep in their bedroom but they have had these pets for quite some time.  No known food allergies.  He does drink usually 3 to 4 cups of coffee in the morning but has done so for years.  Does not drink alcohol regularly.  Has taken occasional famotidine in the past but no history of PPI use.  No current facial pain.  No purulent discharge.  No fever.  Never smoked.  Past medical history significant for Parkinson's disease and history of prostate cancer.  He had small bowel perforation several months ago and is healed fully from that.  Past Medical History:  Diagnosis Date   Allergy    Blood in stool    Cancer (HCC) 11/26/2010   prostate   Parkinson disease [facility])    Past Surgical History:  Procedure Laterality Date   CATARACT EXTRACTION Right    HEMORROIDECTOMY  1986   LAPAROSCOPY N/A 01/18/2024   Procedure: ATTEMPTED LAPAROSCOPY CONVERTED TO OPEN DIAGNOSTIC LAPAROTOMY WITH BOWEL RESCTION;  Surgeon: Dorena Gander, MD;  Location: [facility] OR;  Service: General;  Laterality: N/A;   PROSTATE SURGERY   2011   TONSILLECTOMY  1965    reports that he has never smoked. He has never used smokeless tobacco. He reports current alcohol use of about 3.0 standard drinks of alcohol per week. He reports that he does not use drugs. family history includes Arthritis in his father; Cancer in his maternal aunt, maternal grandmother, mother, and paternal grandmother; Ovarian cancer in his paternal grandmother; Parkinson's disease in his father. Allergies  Allergen Reactions   Aloe     rash    Review of Systems  Constitutional:  Negative for chills, fever and weight loss.  HENT:  Negative for sinus pain and sore throat.   Respiratory:  Positive for sputum production. Negative for hemoptysis, shortness of breath and wheezing.   Cardiovascular:  Negative for chest pain.  Gastrointestinal:  Negative for abdominal pain.      Objective:     BP 136/86 (BP Location: Left Arm, Patient Position: Sitting, Cuff Size: Normal)   Pulse 77   Temp 98.1 F (36.7 C) (Oral)   Wt 218 lb 8 oz (99.1 kg)   SpO2 95%   BMI 31.35 kg/m  BP Readings from Last 3 Encounters:  05/05/24 136/86  02/04/24 120/72  01/24/24 122/61   Wt Readings from Last 3 Encounters:  05/05/24 218 lb 8 oz (99.1 kg)  02/04/24 218 lb 8 oz (99.1 kg)  01/22/24  218 lb (98.9 kg)      Physical Exam Vitals reviewed.  Constitutional:      General: He is not in acute distress.    Appearance: He is not ill-appearing.  HENT:     Right Ear: Tympanic membrane normal.     Left Ear: Tympanic membrane normal.     Mouth/Throat:     Mouth: Mucous membranes are moist.     Pharynx: Oropharynx is clear.  Cardiovascular:     Rate and Rhythm: Normal rate and regular rhythm.  Pulmonary:     Effort: Pulmonary effort is normal.     Breath sounds: Normal breath sounds. No wheezing or rales.  Musculoskeletal:     Cervical back: Neck supple. No rigidity.  Neurological:     Mental Status: He is alert.      No results found for any visits on  05/05/24.  Last CBC Lab Results  Component Value Date   WBC 8.9 01/23/2024   HGB 12.9 (L) 01/23/2024   HCT 36.7 (L) 01/23/2024   MCV 90.4 01/23/2024   MCH 31.8 01/23/2024   RDW 12.6 01/23/2024   PLT 255 01/23/2024   Last metabolic panel Lab Results  Component Value Date   GLUCOSE 112 (H) 01/23/2024   NA 136 01/23/2024   K 3.9 01/23/2024   CL 98 01/23/2024   CO2 29 01/23/2024   BUN 11 01/23/2024   CREATININE 0.89 01/24/2024   GFRNONAA >60 01/24/2024   CALCIUM 8.9 01/23/2024   PHOS 2.6 01/22/2024   PROT 6.7 01/17/2024   ALBUMIN 3.7 01/17/2024   BILITOT 0.9 01/17/2024   ALKPHOS 49 01/17/2024   AST 19 01/17/2024   ALT 11 01/17/2024   ANIONGAP 9 01/23/2024   Last lipids Lab Results  Component Value Date   CHOL 186 07/30/2023   HDL 35.60 (L) 07/30/2023   LDLCALC 105 (H) 07/30/2023   LDLDIRECT 133.0 07/03/2022   TRIG 227.0 (H) 07/30/2023   CHOLHDL 5 07/30/2023   Last hemoglobin A1c No results found for: "HGBA1C" Last thyroid  functions Lab Results  Component Value Date   TSH 1.66 07/30/2023      The 10-year ASCVD risk score (Arnett DK, et al., 2019) is: 19.6%    Assessment & Plan:   Several month history of increased upper airway mucus and frequent clearing of throat.  He tried multiple allergy medications including antihistamines, Flonase, Astelin  without much benefit.  We have considered possible GERD component.  We have recommended trial of Protonix 40 mg once daily.  Also strongly advised to elevate head of bed 4 to 6 inches.  Continue to avoid eating within 3 to 4 hours of bedtime.  Discussed possible food triggers for GERD with handout given.  Consider gradually scaling back caffeine a bit.    Set up 1 month follow-up.  If symptoms not improved at that time consider allergist referral for possible allergy testing.  Glean Lamy, MD

## 2024-05-07 ENCOUNTER — Other Ambulatory Visit: Payer: Self-pay | Admitting: Neurology

## 2024-05-07 DIAGNOSIS — G20A1 Parkinson's disease without dyskinesia, without mention of fluctuations: Secondary | ICD-10-CM

## 2024-05-28 ENCOUNTER — Other Ambulatory Visit: Payer: Self-pay | Admitting: Neurology

## 2024-05-28 ENCOUNTER — Encounter: Payer: Self-pay | Admitting: Family Medicine

## 2024-05-28 DIAGNOSIS — L821 Other seborrheic keratosis: Secondary | ICD-10-CM | POA: Diagnosis not present

## 2024-05-28 DIAGNOSIS — J988 Other specified respiratory disorders: Secondary | ICD-10-CM

## 2024-05-28 DIAGNOSIS — L237 Allergic contact dermatitis due to plants, except food: Secondary | ICD-10-CM | POA: Diagnosis not present

## 2024-05-28 DIAGNOSIS — D2239 Melanocytic nevi of other parts of face: Secondary | ICD-10-CM | POA: Diagnosis not present

## 2024-05-28 DIAGNOSIS — L718 Other rosacea: Secondary | ICD-10-CM | POA: Diagnosis not present

## 2024-05-28 DIAGNOSIS — L918 Other hypertrophic disorders of the skin: Secondary | ICD-10-CM | POA: Diagnosis not present

## 2024-05-28 DIAGNOSIS — D225 Melanocytic nevi of trunk: Secondary | ICD-10-CM | POA: Diagnosis not present

## 2024-05-28 DIAGNOSIS — D2262 Melanocytic nevi of left upper limb, including shoulder: Secondary | ICD-10-CM | POA: Diagnosis not present

## 2024-05-28 DIAGNOSIS — G20A1 Parkinson's disease without dyskinesia, without mention of fluctuations: Secondary | ICD-10-CM

## 2024-06-22 NOTE — Progress Notes (Unsigned)
 Assessment/Plan:   1.  Parkinsons Disease  -move carbidopa /levodopa  25/100, 2 tablets at 7 AM, 2 tablets at 11 AM, 2 tablet at 3 PM and then he will decide if he needs another at 6-7pm  -continue carbidopa /levodopa  50/200 CR at bed for cramping.  This has helped.  -continue entacapone  with first 3 levodopa  doses  -discussed Parkinsons Disease GeneRation study  -We discussed that it used to be thought that levodopa  would increase risk of melanoma but now it is believed that Parkinsons itself likely increases risk of melanoma. he is to get regular skin checks.  He is seeing Dr. Rolan Molt  -increase exercise  2.  Dysphagia  -MBE in May, 2022 with evidence of mild oropharyngeal dysphagia.  Regular solid as well as mechanical soft solids with thin liquids were recommended.  -c/o a thick mucous in the throat and pcp has tried a lot of medication for it and has allergy appt upcoming.  I suspect due to Parkinsons Disease but often this is a c/o in these patients but there is not good tx.    3.  Lightheadedness  -improved  -BP good  4.  Urinary frequency  -needs to f/u with urology  5.  Fatigue  -we discussed that fatigue is the number one treatment resistant complaint of Parkinsons Disease patients.  No matter what we do with the Parkinsons Disease medications (including d/c them), patients tend to c/o fatigue, unfortunately.    -Primary care has recommended sleep study, and I agree with that, but patient declined.  He and I discussed this today.   6.  Constipation  -discussed nature and pathophysiology and association with PD  -discussed importance of hydration.  Pt is to increase water intake  -pt is given a copy of the rancho recipe  -recommended daily colace  -recommended miralax prn 7.  History of bowel perforation  - Status post small bowel resection with Dr. Sebastian Subjective:   James Collier was seen today in follow up for Parkinsons disease, diagnosed last visit.  My  previous records were reviewed prior to todays visit as well as outside records available to me.  This patient is accompanied in the office by his spouse who supplements the history.  Last visit, entacapone  was added and patient reports that he still thinks that the afternoon dose of levodopa  wears off an hour before his medication is due.  He does well in the AM but by 3pm, he is needing medication (and his medication is taken at 4pm).  On 4 July, the patient was in the hospital late February due to bowel obstruction/bowel perforation with intra-abdominal abscess.  He did lose quite a bit of weight after that, although the patient was hoping to keep it off.  He has followed up a few times with his primary care since his hospitalization and I have reviewed the notes.  Most of his complaints have revolved around upper airway mucus and frequent throat clearing and nasal drip.  He had tried multiple allergy medications including antihistamines, Flonase, Astelin  and most recently Protonix .  None of that was of benefit and the patient just recently got a referral to allergy and asthma.  Current prescribed movement disorder medications: Carbidopa /levodopa  25/100, 2/2/2 Carbidopa /levodopa  50/200 CR at bedtime  Entacapone  200 mg, 1 3 times per day with each dose of levodopa  (added last visit)  PREVIOUS MEDICATIONS: pramipexole  (eds)  ALLERGIES:   Allergies  Allergen Reactions   Aloe     rash  CURRENT MEDICATIONS:  Outpatient Encounter Medications as of 06/24/2024  Medication Sig   acetaminophen  (TYLENOL ) 500 MG tablet Take 500 mg by mouth every 6 (six) hours as needed for mild pain (pain score 1-3) or moderate pain (pain score 4-6).   azelastine  (ASTELIN ) 0.1 % nasal spray Place 2 sprays into both nostrils 2 (two) times daily. Use in each nostril as directed   carbidopa -levodopa  (SINEMET  CR) 50-200 MG tablet TAKE 1 TABLET BY MOUTH ONCE DAILY AT BEDTIME   carbidopa -levodopa  (SINEMET  IR) 25-100 MG  tablet TAKE 2 TABLETS AT 7AM, TAKE 2 TABLETS AT 11AM, TAKE 2 TABLETS AT 4PM   cetirizine (ZYRTEC) 10 MG tablet Take 10 mg by mouth daily.   docusate sodium  (COLACE) 100 MG capsule Take 1 capsule (100 mg total) by mouth daily as needed for mild constipation.   entacapone  (COMTAN ) 200 MG tablet Take 1 tablet (200 mg total) by mouth 3 (three) times daily. 7am/11am/4pm with carbidopa /levodopa  (Patient taking differently: Take 200 mg by mouth See admin instructions. Take 1 tablet by mouth at 7am then take 1 tablet by mouth at 11am then take 1 tablet at 4pm. Take medication with Carbidopa -Levodopa  25-100mg .)   FAMOTIDINE PO Take 1 tablet by mouth daily as needed.   montelukast  (SINGULAIR ) 10 MG tablet Take 1 tablet by mouth once daily   Multiple Vitamins-Minerals (PRESERVISION AREDS 2 PO) Take 1 tablet by mouth in the morning and at bedtime.   pantoprazole  (PROTONIX ) 40 MG tablet Take 1 tablet (40 mg total) by mouth daily.   Pseudoeph-Doxylamine-DM-APAP (NYQUIL PO) Take by mouth.   No facility-administered encounter medications on file as of 06/24/2024.    Objective:   PHYSICAL EXAMINATION:    VITALS:   Vitals:   06/24/24 0945  BP: 136/72  Pulse: 68  SpO2: 99%  Weight: 220 lb 6.4 oz (100 kg)  Height: 5' 10 (1.778 m)    GEN:  The patient appears stated age and is in NAD. HEENT:  Normocephalic, atraumatic.  The mucous membranes are moist. The superficial temporal arteries are without ropiness or tenderness. CV:  RRR Lungs:  CTAB Neck:  no bruit   Neurological examination:  Orientation: The patient is alert and oriented x3. Cranial nerves: There is good facial symmetry with facial hypomimia. The speech is fluent and clear. Soft palate rises symmetrically and there is no tongue deviation. Hearing is intact to conversational tone. Sensation: Sensation is intact to light touch throughout Motor: Strength is at least antigravity x4.  Movement examination: Tone: There is mild increased  tone in the bilateral UE (same as previous) Abnormal movements: mild axial dyskinesia, stable Coordination:  There is mild decremation with finger taps bilaterally Gait and Station: The patient has no difficulty arising out of a deep-seated chair without the use of the hands. He has decreased arm swing bilaterally.  He is not short stepped  I have reviewed and interpreted the following labs independently    Chemistry      Component Value Date/Time   NA 136 01/23/2024 0627   K 3.9 01/23/2024 0627   CL 98 01/23/2024 0627   CO2 29 01/23/2024 0627   BUN 11 01/23/2024 0627   CREATININE 0.89 01/24/2024 0821      Component Value Date/Time   CALCIUM 8.9 01/23/2024 0627   ALKPHOS 49 01/17/2024 0918   AST 19 01/17/2024 0918   ALT 11 01/17/2024 0918   BILITOT 0.9 01/17/2024 9081       Lab Results  Component Value Date  WBC 8.9 01/23/2024   HGB 12.9 (L) 01/23/2024   HCT 36.7 (L) 01/23/2024   MCV 90.4 01/23/2024   PLT 255 01/23/2024    Lab Results  Component Value Date   TSH 1.66 07/30/2023     Total time spent on today's visit was 40 minutes, including both face-to-face time and nonface-to-face time.  Time included that spent on review of records (prior notes available to me/labs/imaging if pertinent), discussing treatment and goals, answering patient's questions and coordinating care.  Cc:  Micheal Wolm ORN, MD

## 2024-06-24 ENCOUNTER — Ambulatory Visit: Payer: Medicare PPO | Admitting: Neurology

## 2024-06-24 ENCOUNTER — Encounter: Payer: Self-pay | Admitting: Neurology

## 2024-06-24 VITALS — BP 136/72 | HR 68 | Ht 70.0 in | Wt 220.4 lb

## 2024-06-24 DIAGNOSIS — G20B1 Parkinson's disease with dyskinesia, without mention of fluctuations: Secondary | ICD-10-CM

## 2024-06-24 DIAGNOSIS — G20A1 Parkinson's disease without dyskinesia, without mention of fluctuations: Secondary | ICD-10-CM

## 2024-06-24 MED ORDER — ENTACAPONE 200 MG PO TABS: 200.0000 mg | ORAL_TABLET | Freq: Three times a day (TID) | ORAL | 1 refills | Status: DC

## 2024-06-24 MED ORDER — CARBIDOPA-LEVODOPA ER 50-200 MG PO TBCR: 1.0000 | EXTENDED_RELEASE_TABLET | Freq: Every day | ORAL | 1 refills | Status: DC

## 2024-06-24 MED ORDER — CARBIDOPA-LEVODOPA 25-100 MG PO TABS: ORAL_TABLET | ORAL | 1 refills | Status: AC

## 2024-06-24 NOTE — Patient Instructions (Signed)
 Take carbidopa /levodopa  25/100, 2 tablets at 7 AM, 2 tablets at 11 AM, 2 tablet at 3 PM and then he will decide if he needs another at 6-7pm Take carbidopa /levodopa  50/200 at bed Take entacapone  with first 3 dosages of daytime levodopa 

## 2024-07-07 ENCOUNTER — Ambulatory Visit: Payer: Self-pay | Admitting: Allergy and Immunology

## 2024-07-07 ENCOUNTER — Other Ambulatory Visit: Payer: Self-pay

## 2024-07-07 ENCOUNTER — Encounter: Payer: Self-pay | Admitting: Allergy and Immunology

## 2024-07-07 VITALS — BP 112/74 | HR 61 | Temp 98.2°F | Resp 18 | Ht 69.75 in | Wt 219.7 lb

## 2024-07-07 DIAGNOSIS — J3089 Other allergic rhinitis: Secondary | ICD-10-CM

## 2024-07-07 DIAGNOSIS — K219 Gastro-esophageal reflux disease without esophagitis: Secondary | ICD-10-CM | POA: Diagnosis not present

## 2024-07-07 MED ORDER — TRIAMCINOLONE ACETONIDE 55 MCG/ACT NA AERO: 1.0000 | INHALATION_SPRAY | Freq: Two times a day (BID) | NASAL | 1 refills | Status: DC

## 2024-07-07 MED ORDER — FAMOTIDINE 40 MG PO TABS: 40.0000 mg | ORAL_TABLET | Freq: Every evening | ORAL | 1 refills | Status: DC

## 2024-07-07 MED ORDER — PANTOPRAZOLE SODIUM 40 MG PO TBEC: 40.0000 mg | DELAYED_RELEASE_TABLET | Freq: Every morning | ORAL | 1 refills | Status: DC

## 2024-07-07 MED ORDER — LEVOCETIRIZINE DIHYDROCHLORIDE 5 MG PO TABS: 5.0000 mg | ORAL_TABLET | Freq: Every day | ORAL | 1 refills | Status: AC | PRN

## 2024-07-07 NOTE — Progress Notes (Signed)
 [place] - [place] - [place] - [place] - [place]   Dear Dr. Micheal,  Thank you for referring James Collier to the [facility]  on 07/07/2024.   Below is a summation of this patient's evaluation and recommendations.  Thank you for your referral. I will keep you informed about this patient's response to treatment.   If you have any questions please do not hesitate to contact me.   Sincerely,  Camellia DOROTHA Denis, MD Allergy / Immunology [facility] [place]    ______________________________________________________________________    NEW PATIENT NOTE  Referring Provider: Micheal Wolm ORN, MD Primary Provider: Micheal Wolm ORN, MD Date of office visit: 07/07/2024    Subjective:   Chief Complaint:  James Collier Sabina (DOB: 1956-07-12) is a 68 y.o. male who presents to the clinic on 07/07/2024 with a chief complaint of Allergic Rhinitis  (Allergies all of his life. He has very increased flow of phlegm in his chest and throat. He states that he spend two hours when he wakes up and before bed trying to get rid of the phlegm. Very thick phlegm) .     HPI: [name] presents to this clinic in evaluation of drainage.  Briggs's big issue is the fact that he has lots of drainage in his throat and he has a difficult time clearing this drainage and he has a lot of throat clearing and he has a little bit of coughing and he feels as though there is a glob in his throat.  Sometimes this really worries him because he feels as though he may choke.  This appears to be really bad when he wakes up and in the evening.  He also has some sneezing and some intermittent nasal congestion but it is really his throat that is worse than his nose.  He does not have any anosmia.  He does note that exposure to dust and grass and some perfumes will stuff up his nose.  He uses menthol cough drops on a pretty common basis and uses  Cepacol spray on a pretty common basis.  He has a history of reflux with regurgitation.  He was empirically treated with pantoprazole  once a day which did not help his drainage issue.  He drinks 3-4 cups of coffee in the morning.  Past Medical History:  Diagnosis Date   Allergy    Blood in stool    Cancer (HCC) 11/26/2010   prostate   Eczema    Parkinson disease (HCC)     Past Surgical History:  Procedure Laterality Date   CATARACT EXTRACTION Right    HEMORROIDECTOMY  1986   LAPAROSCOPY N/A 01/18/2024   Procedure: ATTEMPTED LAPAROSCOPY CONVERTED TO OPEN DIAGNOSTIC LAPAROTOMY WITH BOWEL RESCTION;  Surgeon: Sebastian Moles, MD;  Location: [facility] OR;  Service: General;  Laterality: N/A;   PROSTATE SURGERY  2011   TONSILLECTOMY  1965    Allergies as of 07/07/2024       Reactions   Aloe    rash        Medication List    acetaminophen  500 MG tablet Commonly known as: TYLENOL  Take 500 mg by mouth every 6 (six) hours as needed for mild pain (pain score 1-3) or moderate pain (pain score 4-6).   azelastine  0.1 % nasal spray Commonly known as: ASTELIN  Place 2 sprays into both nostrils 2 (two) times daily. Use in each nostril as directed   carbidopa -levodopa  25-100 MG tablet  Commonly known as: SINEMET  IR 2 at 7 AM, 2 at 11 AM, 2 at 3 PM, 1 at 6pm   carbidopa -levodopa  50-200 MG tablet Commonly known as: SINEMET  CR Take 1 tablet by mouth at bedtime.   cetirizine 10 MG tablet Commonly known as: ZYRTEC Take 10 mg by mouth daily.   docusate sodium  100 MG capsule Commonly known as: COLACE Take 1 capsule (100 mg total) by mouth daily as needed for mild constipation.   entacapone  200 MG tablet Commonly known as: COMTAN  Take 1 tablet (200 mg total) by mouth 3 (three) times daily. 7am/11am/3pm with carbidopa /levodopa    FAMOTIDINE  PO Take 1 tablet by mouth daily as needed.   loratadine 10 MG tablet Commonly known as: CLARITIN Take 10 mg by mouth daily.   montelukast  10 MG  tablet Commonly known as: SINGULAIR  Take 1 tablet by mouth once daily   NYQUIL PO Take by mouth.   pantoprazole  40 MG tablet Commonly known as: PROTONIX  Take 1 tablet (40 mg total) by mouth daily.   PRESERVISION AREDS 2 PO Take 1 tablet by mouth in the morning and at bedtime.    Review of systems negative except as noted in HPI / PMHx or noted below:  Review of Systems  Constitutional: Negative.   HENT: Negative.    Eyes: Negative.   Respiratory: Negative.    Cardiovascular: Negative.   Gastrointestinal: Negative.   Genitourinary: Negative.   Musculoskeletal: Negative.   Skin: Negative.   Neurological: Negative.   Endo/Heme/Allergies: Negative.   Psychiatric/Behavioral: Negative.      Family History  Problem Relation Age of Onset   Cancer Mother        pancreatic   Arthritis Father        ?psoriatic   Parkinson's disease Father    Cancer Maternal Aunt        esophgeal   Cancer Paternal Grandmother        ovarian   Ovarian cancer Paternal Grandmother    Cancer Maternal Grandmother    Colon cancer Neg Hx     Social History   Socioeconomic History   Marital status: Married    Spouse name: Euan Wandler    Number of children: 1   Years of education: Not on file   Highest education level: Not on file  Occupational History   Not on file  Tobacco Use   Smoking status: Never   Smokeless tobacco: Never  Vaping Use   Vaping status: Never Used  Substance and Sexual Activity   Alcohol use: Yes    Alcohol/week: 3.0 standard drinks of alcohol    Types: 3 Cans of beer per week   Drug use: No   Sexual activity: Not on file  Other Topics Concern   Not on file  Social History Narrative   Left Handed    Lives in a one story home    Drinks Caffeine    Social Drivers of Health   Financial Resource Strain: Not on file  Food Insecurity: No Food Insecurity (01/17/2024)   Hunger Vital Sign    Worried About Running Out of Food in the Last Year: Never true    Ran  Out of Food in the Last Year: Never true  Transportation Needs: No Transportation Needs (01/17/2024)   PRAPARE - Administrator, Civil Service (Medical): No    Lack of Transportation (Non-Medical): No  Physical Activity: Not on file  Stress: Not on file  Social Connections: Moderately Integrated (01/17/2024)  Social Advertising account executive    Frequency of Communication with Friends and Family: More than three times a week    Frequency of Social Gatherings with Friends and Family: Once a week    Attends Religious Services: More than 4 times per year    Active Member of [employer] or Organizations: No    Attends Banker Meetings: Never    Marital Status: Married  Catering manager Violence: Not At Risk (01/17/2024)   Humiliation, Afraid, Rape, and Kick questionnaire    Fear of Current or Ex-Partner: No    Emotionally Abused: No    Physically Abused: No    Sexually Abused: No    Environmental and Social history  Lives in a house with a dry environment, dog looking inside the household, carpet in the bedroom, plastic on the bed, no plastic on the pillow, no smoking ongoing with inside the household.  He is a retired Occupational hygienist.  Objective:   Vitals:   07/07/24 1422  BP: 112/74  Pulse: 61  Resp: 18  Temp: 98.2 F (36.8 C)  SpO2: 95%   Height: 5' 9.75 (177.2 cm) Weight: 219 lb 11.2 oz (99.7 kg)  Physical Exam Constitutional:      Appearance: He is not diaphoretic.  HENT:     Head: Normocephalic.     Right Ear: Tympanic membrane, ear canal and external ear normal.     Left Ear: Tympanic membrane, ear canal and external ear normal.     Nose: Nose normal. No mucosal edema or rhinorrhea.     Mouth/Throat:     Pharynx: Uvula midline. No oropharyngeal exudate.  Eyes:     Conjunctiva/sclera: Conjunctivae normal.  Neck:     Thyroid : No thyromegaly.     Trachea: Trachea normal. No tracheal tenderness or tracheal deviation.  Cardiovascular:     Rate and  Rhythm: Normal rate and regular rhythm.     Heart sounds: Normal heart sounds, S1 normal and S2 normal. No murmur heard. Pulmonary:     Effort: No respiratory distress.     Breath sounds: Normal breath sounds. No stridor. No wheezing or rales.  Lymphadenopathy:     Head:     Right side of head: No tonsillar adenopathy.     Left side of head: No tonsillar adenopathy.     Cervical: No cervical adenopathy.  Skin:    Findings: No erythema or rash.     Nails: There is no clubbing.  Neurological:     Mental Status: He is alert.     Diagnostics: Allergy skin tests were not performed.   Assessment and Plan:    1. Perennial allergic rhinitis   2. LPRD (laryngopharyngeal reflux disease)    1. Return to clinic for skin testing without antihistamines  2. Treat reflux / LPR:   A. Pantoprazole  40 mg - 1 tab in AM  B. Famotidine  40 mg - 1 tab in PM  C. Consolidate caffeine consumption  D. Replace throat clearing with swallowing/drinking maneuver  3. Treat inflammation of airway:   A. OTC Nasacort  - 1 spray each nostril 2 times per day  4. If needed:   A. OTC antihistamine  B. Use Jolly Ranchers to replace menthol cough drops and Cepacol  5. Influenza = Tamiflu. Covid = Paxlovid  Jadin most likely has 2 insults to his respiratory tract with 1 being allergen exposure and the other being acid exposure and we are going to work through both of these issues by having  him return to this clinic for skin testing, use nasal steroids on a consistent basis, and aggressively treat LPR.  He needs to stop using his menthol-based cough drops and Cepacol spray and he can replace those with Chrystie Lent.  Camellia DOROTHA Denis, MD Allergy / Immunology  [facility] 

## 2024-07-07 NOTE — Patient Instructions (Addendum)
  1. Return to clinic for skin testing without antihistamines  2. Treat reflux / LPR:   A. Pantoprazole  40 mg - 1 tab in AM  B. Famotidine  40 mg - 1 tab in PM  C. Consolidate caffeine consumption  D. Replace throat clearing with swallowing/drinking maneuver  3. Treat inflammation of airway:   A. OTC Nasacort  - 1 spray each nostril 2 times per day  4. If needed:   A. OTC antihistamine  B. Use Jolly Ranchers to replace menthol cough drops and Cepacol  5. Influenza = Tamiflu. Covid = Paxlovid

## 2024-07-08 ENCOUNTER — Encounter: Payer: Self-pay | Admitting: Allergy and Immunology

## 2024-07-21 ENCOUNTER — Ambulatory Visit: Admitting: Allergy and Immunology

## 2024-07-21 DIAGNOSIS — J3089 Other allergic rhinitis: Secondary | ICD-10-CM | POA: Diagnosis not present

## 2024-07-22 ENCOUNTER — Encounter: Payer: Self-pay | Admitting: Allergy and Immunology

## 2024-07-22 NOTE — Progress Notes (Signed)
 James Collier returns to this clinic to have skin testing performed.  Allergy  skin testing did not identify any hypersensitivity against a screening panel of aeroallergens.

## 2024-08-04 ENCOUNTER — Other Ambulatory Visit: Payer: Self-pay | Admitting: Neurology

## 2024-08-04 DIAGNOSIS — G20A1 Parkinson's disease without dyskinesia, without mention of fluctuations: Secondary | ICD-10-CM

## 2024-08-17 DIAGNOSIS — H43812 Vitreous degeneration, left eye: Secondary | ICD-10-CM | POA: Diagnosis not present

## 2024-08-17 DIAGNOSIS — H353132 Nonexudative age-related macular degeneration, bilateral, intermediate dry stage: Secondary | ICD-10-CM | POA: Diagnosis not present

## 2024-08-17 DIAGNOSIS — H43823 Vitreomacular adhesion, bilateral: Secondary | ICD-10-CM | POA: Diagnosis not present

## 2024-08-17 DIAGNOSIS — Z961 Presence of intraocular lens: Secondary | ICD-10-CM | POA: Diagnosis not present

## 2024-08-17 DIAGNOSIS — D3131 Benign neoplasm of right choroid: Secondary | ICD-10-CM | POA: Diagnosis not present

## 2024-08-18 ENCOUNTER — Telehealth: Payer: Self-pay

## 2024-08-18 ENCOUNTER — Encounter (INDEPENDENT_AMBULATORY_CARE_PROVIDER_SITE_OTHER): Payer: Self-pay

## 2024-08-18 ENCOUNTER — Telehealth: Payer: Self-pay | Admitting: Allergy and Immunology

## 2024-08-18 ENCOUNTER — Ambulatory Visit: Admitting: Allergy and Immunology

## 2024-08-18 VITALS — BP 110/78 | HR 65 | Temp 98.3°F | Resp 12 | Ht 70.0 in | Wt 216.0 lb

## 2024-08-18 DIAGNOSIS — K219 Gastro-esophageal reflux disease without esophagitis: Secondary | ICD-10-CM | POA: Diagnosis not present

## 2024-08-18 DIAGNOSIS — J3089 Other allergic rhinitis: Secondary | ICD-10-CM | POA: Diagnosis not present

## 2024-08-18 MED ORDER — TRIAMCINOLONE ACETONIDE 55 MCG/ACT NA AERO: 1.0000 | INHALATION_SPRAY | Freq: Every morning | NASAL | 1 refills | Status: AC

## 2024-08-18 MED ORDER — FAMOTIDINE 40 MG PO TABS: 40.0000 mg | ORAL_TABLET | Freq: Every evening | ORAL | 1 refills | Status: DC

## 2024-08-18 MED ORDER — PANTOPRAZOLE SODIUM 40 MG PO TBEC: 40.0000 mg | DELAYED_RELEASE_TABLET | Freq: Every morning | ORAL | 1 refills | Status: DC

## 2024-08-18 NOTE — Progress Notes (Unsigned)
 [place] [facility] [place] [facility] [place] [facility] [place] [facility] [place]   Follow-up Note  Referring Provider: Micheal Wolm ORN, MD Primary Provider: Micheal Wolm ORN, MD Date of Office Visit: 08/18/2024  Subjective:   James Collier (DOB: 1956/06/26) is a 68 y.o. male who [facility] in re-evaluation of the following:  HPI: James Collier returns to this clinic in reevaluation of LPR and rhinitis.  I last saw him in this clinic 21 July 2024.  When I initially saw him in this clinic his big problem was the fact that he had lots of throat clearing and he had a glob in his throat.  Occasionally he has some nasal congestion and sneezing but it was really his throat that was bothering him the most.  We had him perform behavioral changes by decreasing his caffeine, eliminating his menthol cough drops, and attempting to replace throat clearing with a swallowing and drinking maneuver while he aggressively treated LPR with a proton pump inhibitor and H2 receptor blocker.  As a result of that treatment he has improved.  His improvement comes in the form of having much less throat clearing.  He is here with his wife today and indeed she mentions that he has decreased his throat clearing significantly.  What has not improved is the fact that he has a sticky spot in his throat that he just cannot eliminate.    He has had very little issues with his upper airway at this point while using nasal steroids.  Allergies as of 08/18/2024       Reactions   Aloe    rash        Medication List    acetaminophen  500 MG tablet Commonly known as: TYLENOL  Take 500 mg by mouth every 6 (six) hours as needed for mild pain (pain score 1-3) or moderate pain (pain score 4-6).   azelastine  0.1 % nasal spray Commonly known as: ASTELIN  Place 2 sprays into both nostrils 2 (two) times daily. Use in each nostril as directed   carbidopa -levodopa  25-100 MG tablet Commonly known as:  SINEMET  IR 2 at 7 AM, 2 at 11 AM, 2 at 3 PM, 1 at 6pm   carbidopa -levodopa  50-200 MG tablet Commonly known as: SINEMET  CR TAKE 1 TABLET BY MOUTH ONCE DAILY AT BEDTIME   cetirizine 10 MG tablet Commonly known as: ZYRTEC Take 10 mg by mouth daily.   docusate sodium  100 MG capsule Commonly known as: COLACE Take 1 capsule (100 mg total) by mouth daily as needed for mild constipation.   entacapone  200 MG tablet Commonly known as: COMTAN  Take 1 tablet (200 mg total) by mouth 3 (three) times daily. 7am/11am/3pm with carbidopa /levodopa    famotidine  40 MG tablet Commonly known as: PEPCID  Take 1 tablet (40 mg total) by mouth at bedtime.   levocetirizine 5 MG tablet Commonly known as: XYZAL  Take 1 tablet (5 mg total) by mouth daily as needed for allergies (Can take an etxra dose during flare ups.).   loratadine 10 MG tablet Commonly known as: CLARITIN Take 10 mg by mouth daily.   montelukast  10 MG tablet Commonly known as: SINGULAIR  Take 1 tablet by mouth once daily   NYQUIL PO Take by mouth.   pantoprazole  40 MG tablet Commonly known as: PROTONIX  Take 1 tablet (40 mg total) by mouth every morning.   PRESERVISION AREDS 2 PO Take 1 tablet by mouth in the morning and at bedtime.   triamcinolone  55 MCG/ACT Aero  nasal inhaler Commonly known as: NASACORT  Place 1 spray into the nose 2 (two) times daily.    Past Medical History:  Diagnosis Date   Allergy     Blood in stool    Cancer (HCC) 11/26/2010   prostate   Eczema    Parkinson disease (HCC)     Past Surgical History:  Procedure Laterality Date   CATARACT EXTRACTION Right    HEMORROIDECTOMY  1986   LAPAROSCOPY N/A 01/18/2024   Procedure: ATTEMPTED LAPAROSCOPY CONVERTED TO OPEN DIAGNOSTIC LAPAROTOMY WITH BOWEL RESCTION;  Surgeon: Sebastian Moles, MD;  Location: [facility] OR;  Service: General;  Laterality: N/A;   PROSTATE SURGERY  2011   TONSILLECTOMY  1965    Review of systems negative except as noted in HPI / PMHx or  noted below:  Review of Systems  Constitutional: Negative.   HENT: Negative.    Eyes: Negative.   Respiratory: Negative.    Cardiovascular: Negative.   Gastrointestinal: Negative.   Genitourinary: Negative.   Musculoskeletal: Negative.   Skin: Negative.   Neurological: Negative.   Endo/Heme/Allergies: Negative.   Psychiatric/Behavioral: Negative.       Objective:   Vitals:   08/18/24 1145  BP: 110/78  Pulse: 65  Resp: 12  Temp: 98.3 F (36.8 C)  SpO2: 94%   Height: 5' 10 (177.8 cm)  Weight: 216 lb (98 kg)   Physical Exam Constitutional:      Appearance: He is not diaphoretic.  HENT:     Head: Normocephalic.     Right Ear: Tympanic membrane, ear canal and external ear normal.     Left Ear: Tympanic membrane, ear canal and external ear normal.     Nose: Nose normal. No mucosal edema or rhinorrhea.     Mouth/Throat:     Pharynx: Uvula midline. No oropharyngeal exudate.  Eyes:     Conjunctiva/sclera: Conjunctivae normal.  Neck:     Thyroid : No thyromegaly.     Trachea: Trachea normal. No tracheal tenderness or tracheal deviation.  Cardiovascular:     Rate and Rhythm: Normal rate and regular rhythm.     Heart sounds: Normal heart sounds, S1 normal and S2 normal. No murmur heard. Pulmonary:     Effort: No respiratory distress.     Breath sounds: Normal breath sounds. No stridor. No wheezing or rales.  Lymphadenopathy:     Head:     Right side of head: No tonsillar adenopathy.     Left side of head: No tonsillar adenopathy.     Cervical: No cervical adenopathy.  Skin:    Findings: No erythema or rash.     Nails: There is no clubbing.  Neurological:     Mental Status: He is alert.     Diagnostics: none  Assessment and Plan:   1. LPRD (laryngopharyngeal reflux disease)   2. Perennial allergic rhinitis    1.  Continue to treat reflux / LPR:   A. Pantoprazole  40 mg - 1 tab in AM  B. Famotidine  40 mg - 1 tab in PM  C. Consolidate caffeine  consumption  D. Replace throat clearing with swallowing/drinking maneuver  2.  Continue to treat inflammation of airway:   A. DECREASE OTC Nasacort  - 1 spray each nostril 1 time per day  3. If needed:   A. OTC antihistamine  B. Jolly Ranchers   4. Evaluation of throat with ENT  5. Influenza = Tamiflu. Covid = Paxlovid  6. Return to clinic in 12 weeks or earlier if problem. Taper  medications???  Earvin appears to have some improvement with his LPR on his current plan but he still has a very sticky sensation in his throat and to be complete we will have ENT perform rhinoscopy to identify any other etiologic factor above and beyond LPR contributing to his issues.  His nose is doing really well and he can decrease his nasal steroid.  Will see him back in this clinic in 12 weeks.  [name], MD Allergy  / Immunology [facility]

## 2024-08-18 NOTE — Patient Instructions (Addendum)
  1.  Continue to treat reflux / LPR:   A. Pantoprazole  40 mg - 1 tab in AM  B. Famotidine  40 mg - 1 tab in PM  C. Consolidate caffeine consumption  D. Replace throat clearing with swallowing/drinking maneuver  2.  Continue to treat inflammation of airway:   A. DECREASE OTC Nasacort  - 1 spray each nostril 1 time per day  3. If needed:   A. OTC antihistamine  B. Jolly Ranchers   4. Evaluation of throat with ENT  5. Influenza = Tamiflu. Covid = Paxlovid  6. Return to clinic in 12 weeks or earlier if problem. Taper medications???

## 2024-08-18 NOTE — Telephone Encounter (Signed)
 Please refer to Dr. Liuba Soldatova for Evaluation of throat with ENT per Dr. Maurilio. Please and thank you!

## 2024-08-18 NOTE — Telephone Encounter (Signed)
 James Collier has been internally referred to [facility] ENT to see Dr. Soldatova.  They will reach out to the patient to schedule.  I will follow up in a week.

## 2024-08-19 ENCOUNTER — Encounter: Payer: Self-pay | Admitting: Allergy and Immunology

## 2024-08-28 ENCOUNTER — Telehealth: Payer: Self-pay | Admitting: Allergy and Immunology

## 2024-08-28 NOTE — Telephone Encounter (Signed)
 Pt checking on the status of his referral to ENT.

## 2024-08-28 NOTE — Telephone Encounter (Signed)
 Spoke to patient and provided him with ENT office phone number. He will give them a call and let us  know if he has any issues.

## 2024-09-01 NOTE — Telephone Encounter (Signed)
 Hulbert is scheduled with Dr. Soldatova for 10/30 at 1:30 pm.

## 2024-09-18 ENCOUNTER — Ambulatory Visit (INDEPENDENT_AMBULATORY_CARE_PROVIDER_SITE_OTHER)

## 2024-09-18 VITALS — BP 126/77 | HR 78 | Ht 70.0 in | Wt 216.0 lb

## 2024-09-18 DIAGNOSIS — J382 Nodules of vocal cords: Secondary | ICD-10-CM

## 2024-09-18 DIAGNOSIS — R09A2 Foreign body sensation, throat: Secondary | ICD-10-CM

## 2024-09-18 NOTE — Progress Notes (Signed)
 Dear Dr. Maurilio, Here is my assessment for our mutual patient, James Collier. Thank you for allowing me the opportunity to care for your patient. Please do not hesitate to contact me should you have any other questions. Sincerely, Dr. Hadassah Parody  Otolaryngology Clinic Note Referring provider: Dr. Maurilio HPI:   Initial HPI (09/18/2024) Discussed the use of AI scribe software for clinical note transcription with the patient, who gave verbal consent to proceed.  History of Present Illness James Collier is a 68 year old male with Parkinson's disease who presents with persistent throat clearing and mucus production.   Chronic throat clearing and mucus production - Persistent throat clearing and sensation of sticky mucus in the throat, most bothersome at night and in the morning - White grape juice provides symptomatic relief - Famotidine  and Protonix  used regularly, with occasional Tums, providing partial relief - No chronic dry cough - History of postnasal drip - No significant recent nasal congestion - History of septoplasty - Suspects allergies as a contributing factor  Voice changes and throat discomfort - Voice tenderness and discomfort, especially after prolonged conversations - Sensation of throat tightness affecting speech - Voice change over the past couple of months, attributed to sensation of mucus in the throat  Family hx of throat cancer  He also requests exam of his abdomen where prior surgery was performed.   Independent Review of Additional Tests or Records:  Referral note (08/18/24) Camellia Maurilio, MD: pt with LPR and rhinitis. Treated LPR with H2 blocker and PPI. ENT referral for scope exam to evaluate for contributing causes of globus sensation.   CBC 01/23/24: hgb 12.9 Cr 01/24/24: 0.89   PMH/Meds/All/SocHx/FamHx/ROS:   Past Medical History:  Diagnosis Date   Allergy     Blood in stool    Cancer (HCC) 11/26/2010   prostate   Eczema    Parkinson disease  (HCC)      Past Surgical History:  Procedure Laterality Date   CATARACT EXTRACTION Right    HEMORROIDECTOMY  1986   LAPAROSCOPY N/A 01/18/2024   Procedure: ATTEMPTED LAPAROSCOPY CONVERTED TO OPEN DIAGNOSTIC LAPAROTOMY WITH BOWEL RESCTION;  Surgeon: Sebastian Moles, MD;  Location: [facility] OR;  Service: General;  Laterality: N/A;   PROSTATE SURGERY  2011   TONSILLECTOMY  1965    Family History  Problem Relation Age of Onset   Cancer Mother        pancreatic   Arthritis Father        ?psoriatic   Parkinson's disease Father    Cancer Maternal Aunt        esophgeal   Cancer Paternal Grandmother        ovarian   Ovarian cancer Paternal Grandmother    Cancer Maternal Grandmother    Colon cancer Neg Hx      Social Connections: Moderately Integrated (01/17/2024)   Social Connection and Isolation Panel    Frequency of Communication with Friends and Family: More than three times a week    Frequency of Social Gatherings with Friends and Family: Once a week    Attends Religious Services: More than 4 times per year    Active Member of [employer] or Organizations: No    Attends Banker Meetings: Never    Marital Status: Married     Current Outpatient Medications  Medication Instructions   acetaminophen  (TYLENOL ) 500 mg, Every 6 hours PRN   azelastine  (ASTELIN ) 0.1 % nasal spray 2 sprays, Each Nare, 2 times daily, Use in each nostril as  directed   carbidopa -levodopa  (SINEMET  CR) 50-200 MG tablet 1 tablet, Oral, Daily at bedtime   carbidopa -levodopa  (SINEMET  IR) 25-100 MG tablet 2 at 7 AM, 2 at 11 AM, 2 at 3 PM, 1 at 6pm   cetirizine (ZYRTEC) 10 mg, Daily   docusate sodium  (COLACE) 100 mg, Oral, Daily PRN   entacapone  (COMTAN ) 200 mg, Oral, 3 times daily, 7am/11am/3pm with carbidopa /levodopa    famotidine  (PEPCID ) 40 mg, Oral, Nightly   levocetirizine (XYZAL ) 5 mg, Oral, Daily PRN   loratadine (CLARITIN) 10 mg, Daily   montelukast  (SINGULAIR ) 10 MG tablet Take 1 tablet by mouth once  daily   Multiple Vitamins-Minerals (PRESERVISION AREDS 2 PO) 1 tablet, 2 times daily   pantoprazole  (PROTONIX ) 40 mg, Oral, Every morning   Pseudoeph-Doxylamine-DM-APAP (NYQUIL PO) Take by mouth.   triamcinolone  (NASACORT ) 55 MCG/ACT AERO nasal inhaler 1 spray, Nasal, Every morning     Physical Exam:   BP 126/77   Pulse 78   Ht 5' 10 (1.778 m)   Wt 216 lb (98 kg)   SpO2 92%   BMI 30.99 kg/m   Salient findings:  CN II-XII intact Bilateral EAC clear and TM intact with well pneumatized middle ear spaces Anterior rhinoscopy: Septum midline; bilateral inferior turbinates with mild hypertrophy TFL was indicated to better evaluate the proximal airway, given the patient's history and exam findings, and is detailed below. No lesions of oral cavity/oropharynx No obviously palpable neck masses/lymphadenopathy/thyromegaly No respiratory distress or stridor ABDOMEN: Soft,  possible hernia at incision site   Seprately Identifiable Procedures:  Prior to initiating any procedures, risks/benefits/alternatives were explained to the patient and verbal consent obtained.  Procedure Note (09/18/2024) Pre-procedure diagnosis:  globus sensation  Post-procedure diagnosis: Same, vocal fold nodule Procedure: Transnasal Fiberoptic Laryngoscopy, CPT 31575 - Mod 25 Indication: globus sensation Complications: None apparent EBL: 0 mL  The procedure was undertaken to further evaluate the patient's complaint of globus sensation, with mirror exam inadequate for appropriate examination due to gag reflex and poor patient tolerance  Procedure:  Patient was identified as correct patient. Verbal consent was obtained. The nose was sprayed with oxymetazoline and 4% lidocaine . The The flexible laryngoscope was passed through the nose to view the nasal cavity, pharynx (oropharynx, hypopharynx) and larynx.  The larynx was examined at rest and during multiple phonatory tasks. Documentation was obtained and reviewed with  patient. The scope was removed. The patient tolerated the procedure well.  Findings: The nasal cavity and nasopharynx did not reveal any masses or lesions, mucosa appeared to be without obvious lesions. The tongue base, pharyngeal walls, piriform sinuses, vallecula, epiglottis and postcricoid region are normal in appearance EXCEPT: there is a small nodule on right vocal cord mid fold. The visualized portion of the subglottis and proximal trachea is widely patent. The vocal folds are mobile bilaterally.   Electronically signed by: Hadassah JAYSON Parody, MD 09/21/2024 8:27 PM   Impression & Plans:  Letroy Vazguez is a 68 y.o. male with   1. Globus sensation   2. Vocal fold nodule    Assessment and Plan Assessment & Plan Globus sensation and laryngopharyngeal reflux Chronic throat sensation likely due to LPR and globus. Reflux medications partially effective. Discussed treatment controversies and psychological factors. Advised persistence of symptoms. - Continue Protonix  and famotidine  as previously prescribed as they seem to be helpful  - Consider Reflux Gourmet gel.  Vocal cord nodule Small vocal cord nodule likely from voice use. Mild hoarseness. Does not appear concerning for malignancy but requires monitoring  due to family cancer history. - Consider voice therapy if symptoms worsen. - Re-evaluate in 3-4 weeks.  Abdominal hernia Protrusion at previous incision site suggests hernia. - Discuss with primary care for evaluation  See below regarding exact medications prescribed this encounter including dosages and route: No orders of the defined types were placed in this encounter.   Thank you for allowing me the opportunity to care for your patient. Please do not hesitate to contact me should you have any other questions.  Sincerely, Hadassah Parody, MD Otolaryngologist (ENT), [facility] Phone: [phone] Fax: [phone]

## 2024-09-22 ENCOUNTER — Encounter: Payer: Self-pay | Admitting: Family Medicine

## 2024-09-22 ENCOUNTER — Ambulatory Visit: Admitting: Family Medicine

## 2024-09-22 VITALS — BP 130/74 | HR 73 | Temp 97.6°F | Wt 223.1 lb

## 2024-09-22 DIAGNOSIS — Z23 Encounter for immunization: Secondary | ICD-10-CM

## 2024-09-22 DIAGNOSIS — K429 Umbilical hernia without obstruction or gangrene: Secondary | ICD-10-CM

## 2024-09-22 NOTE — Progress Notes (Signed)
 Established Patient Office Visit  Subjective   Patient ID: James Collier, male    DOB: 24-Jul-1956  Age: 68 y.o. MRN: [id number]  Chief Complaint  Patient presents with   Hernia    HPI   [name] is seen with concern for umbilical hernia.  Back in February he had small bowel perforation and underwent surgery for small bowel resection.  He first noticed some swelling in this region several weeks ago.  Worse with straining and lifting.  No significant pain.  He has Parkinson's disease but tries to stay active with activities  He continues to have frequent increased mucus especially early morning hours in his throat.  He went to allergist and was maintained on PPI and addition of H2 blocker.  Symptoms persisted.  He just saw ENT recently and had nasolaryngoscopy which showed benign-appearing nodule right vocal cord but no other abnormalities.  Past Medical History:  Diagnosis Date   Allergy     Blood in stool    Cancer (HCC) 11/26/2010   prostate   Eczema    Parkinson disease (HCC)    Past Surgical History:  Procedure Laterality Date   CATARACT EXTRACTION Right    HEMORROIDECTOMY  1986   LAPAROSCOPY N/A 01/18/2024   Procedure: ATTEMPTED LAPAROSCOPY CONVERTED TO OPEN DIAGNOSTIC LAPAROTOMY WITH BOWEL RESCTION;  Surgeon: Sebastian Moles, MD;  Location: [facility] OR;  Service: General;  Laterality: N/A;   PROSTATE SURGERY  2011   TONSILLECTOMY  1965    reports that he has never smoked. He has never used smokeless tobacco. He reports current alcohol use of about 3.0 standard drinks of alcohol per week. He reports that he does not use drugs. family history includes Arthritis in his father; Cancer in his maternal aunt, maternal grandmother, mother, and paternal grandmother; Ovarian cancer in his paternal grandmother; Parkinson's disease in his father. Allergies  Allergen Reactions   Aloe     rash    Review of Systems  Constitutional:  Negative for chills and fever.  Gastrointestinal:   Negative for abdominal pain, nausea and vomiting.      Objective:     BP 130/74   Pulse 73   Temp 97.6 F (36.4 C) (Oral)   Wt 223 lb 1.6 oz (101.2 kg)   SpO2 96%   BMI 32.01 kg/m    Physical Exam Vitals reviewed.  Constitutional:      General: He is not in acute distress.    Appearance: He is not ill-appearing.  Cardiovascular:     Rate and Rhythm: Normal rate and regular rhythm.  Pulmonary:     Effort: Pulmonary effort is normal.     Breath sounds: Normal breath sounds.  Abdominal:     Comments: He has small umbilical hernia which is soft and nontender to palpation.  Neurological:     Mental Status: He is alert.      No results found for any visits on 09/22/24.    The 10-year ASCVD risk score (Arnett DK, et al., 2019) is: 18.3%    Assessment & Plan:   Problem List Items Addressed This Visit   None Visit Diagnoses       Umbilical hernia without obstruction and without gangrene    -  Primary   Relevant Orders   Ambulatory referral to General Surgery     Patient seen with umbilical hernia which she noticed weeks ago.  No associated pain.  No signs or symptoms of strangulation.  Patient is interested in referral back to  general surgery to discuss pros and cons of repair.  Handout on umbilical hernias given and referral placed  No follow-ups on file.    Wolm Scarlet, MD

## 2024-09-22 NOTE — Addendum Note (Signed)
 Addended by: METTA KRISTEN CROME on: 09/22/2024 02:36 PM   Modules accepted: Orders

## 2024-09-24 ENCOUNTER — Institutional Professional Consult (permissible substitution) (INDEPENDENT_AMBULATORY_CARE_PROVIDER_SITE_OTHER): Admitting: Otolaryngology

## 2024-09-29 DIAGNOSIS — M1711 Unilateral primary osteoarthritis, right knee: Secondary | ICD-10-CM | POA: Diagnosis not present

## 2024-10-07 ENCOUNTER — Ambulatory Visit: Payer: Self-pay | Admitting: General Surgery

## 2024-10-07 DIAGNOSIS — K432 Incisional hernia without obstruction or gangrene: Secondary | ICD-10-CM | POA: Diagnosis not present

## 2024-10-07 DIAGNOSIS — G20A1 Parkinson's disease without dyskinesia, without mention of fluctuations: Secondary | ICD-10-CM | POA: Diagnosis not present

## 2024-10-07 DIAGNOSIS — Z9049 Acquired absence of other specified parts of digestive tract: Secondary | ICD-10-CM | POA: Diagnosis not present

## 2024-10-09 DIAGNOSIS — M25562 Pain in left knee: Secondary | ICD-10-CM | POA: Diagnosis not present

## 2024-10-09 DIAGNOSIS — M25561 Pain in right knee: Secondary | ICD-10-CM | POA: Diagnosis not present

## 2024-10-09 DIAGNOSIS — M170 Bilateral primary osteoarthritis of knee: Secondary | ICD-10-CM | POA: Diagnosis not present

## 2024-10-12 ENCOUNTER — Ambulatory Visit (INDEPENDENT_AMBULATORY_CARE_PROVIDER_SITE_OTHER)

## 2024-10-12 VITALS — BP 108/68 | HR 66

## 2024-10-12 DIAGNOSIS — R09A2 Foreign body sensation, throat: Secondary | ICD-10-CM | POA: Diagnosis not present

## 2024-10-12 DIAGNOSIS — J382 Nodules of vocal cords: Secondary | ICD-10-CM

## 2024-10-12 DIAGNOSIS — K219 Gastro-esophageal reflux disease without esophagitis: Secondary | ICD-10-CM

## 2024-10-12 NOTE — Progress Notes (Signed)
 Dear Dr. Micheal, Here is my assessment for our mutual patient, James Collier. Thank you for allowing me the opportunity to care for your patient. Please do not hesitate to contact me should you have any other questions. Sincerely, Dr. Hadassah Parody  Otolaryngology Clinic Note Referring provider: Dr. Micheal HPI:   Initial HPI (09/18/24) Discussed the use of AI scribe software for clinical note transcription with the patient, who gave verbal consent to proceed.  James Collier is a 68 year old male with Parkinson's disease who presents with persistent throat clearing and mucus production.   Chronic throat clearing and mucus production - Persistent throat clearing and sensation of sticky mucus in the throat, most bothersome at night and in the morning - White grape juice provides symptomatic relief - Famotidine  and Protonix  used regularly, with occasional Tums, providing partial relief - No chronic dry cough - History of postnasal drip - No significant recent nasal congestion - History of septoplasty - Suspects allergies as a contributing factor  Voice changes and throat discomfort - Voice tenderness and discomfort, especially after prolonged conversations - Sensation of throat tightness affecting speech - Voice change over the past couple of months, attributed to sensation of mucus in the throat  Family hx of throat cancer  He also requests exam of his abdomen where prior surgery was performed.   History of Present Illness --------------------------------------------------------- 10/12/2024  Today reports he has been using reflux gourmet and it is somewhat helpful. Not always.  Lifestyle modifications for symptom management - Moved meals earlier in the day - Reduced coffee and alcohol intake - Avoids fatty foods - Reluctant to adopt a vegetarian diet  Mucus production persists at times and needs to take mucinex  but overall he is feeling better than before.  No other  major changes   No additional voice changes or concerns    Independent Review of Additional Tests or Records:  Referral note (08/18/24) [name], MD: pt with LPR and rhinitis. Treated LPR with H2 blocker and PPI. ENT referral for scope exam to evaluate for contributing causes of globus sensation.   CBC 01/23/24: hgb 12.9 Cr 01/24/24: 0.89   PMH/Meds/All/SocHx/FamHx/ROS:   Past Medical History:  Diagnosis Date   Allergy     Blood in stool    Cancer (HCC) 11/26/2010   prostate   Eczema    Parkinson disease (HCC)      Past Surgical History:  Procedure Laterality Date   CATARACT EXTRACTION Right    HEMORROIDECTOMY  1986   LAPAROSCOPY N/A 01/18/2024   Procedure: ATTEMPTED LAPAROSCOPY CONVERTED TO OPEN DIAGNOSTIC LAPAROTOMY WITH BOWEL RESCTION;  Surgeon: Sebastian Moles, MD;  [facility];  Service: General;  Laterality: N/A;   PROSTATE SURGERY  2011   TONSILLECTOMY  1965    Family History  Problem Relation Age of Onset   Cancer Mother        pancreatic   Arthritis Father        ?psoriatic   Parkinson's disease Father    Cancer Maternal Aunt        esophgeal   Cancer Paternal Grandmother        ovarian   Ovarian cancer Paternal Grandmother    Cancer Maternal Grandmother    Colon cancer Neg Hx      Social Connections: Moderately Integrated (01/17/2024)   Social Connection and Isolation Panel    Frequency of Communication with Friends and Family: More than three times a week    Frequency of Social Gatherings with  Friends and Family: Once a week    Attends Religious Services: More than 4 times per year    Active Member of [employer] or Organizations: No    Attends Engineer, Structural: Never    Marital Status: Married     Current Outpatient Medications  Medication Instructions   acetaminophen  (TYLENOL ) 500 mg, Every 6 hours PRN   azelastine  (ASTELIN ) 0.1 % nasal spray 2 sprays, Each Nare, 2 times daily, Use in each nostril as directed   carbidopa -levodopa   (SINEMET  CR) 50-200 MG tablet 1 tablet, Oral, Daily at bedtime   carbidopa -levodopa  (SINEMET  IR) 25-100 MG tablet 2 at 7 AM, 2 at 11 AM, 2 at 3 PM, 1 at 6pm   cetirizine (ZYRTEC) 10 mg, Daily   docusate sodium  (COLACE) 100 mg, Oral, Daily PRN   entacapone  (COMTAN ) 200 mg, Oral, 3 times daily, 7am/11am/3pm with carbidopa /levodopa    famotidine  (PEPCID ) 40 mg, Oral, Nightly   levocetirizine (XYZAL ) 5 mg, Oral, Daily PRN   loratadine (CLARITIN) 10 mg, Daily   montelukast  (SINGULAIR ) 10 MG tablet Take 1 tablet by mouth once daily   Multiple Vitamins-Minerals (PRESERVISION AREDS 2 PO) 1 tablet, 2 times daily   pantoprazole  (PROTONIX ) 40 mg, Oral, Every morning   Pseudoeph-Doxylamine-DM-APAP (NYQUIL PO) Take by mouth.   triamcinolone  (NASACORT ) 55 MCG/ACT AERO nasal inhaler 1 spray, Nasal, Every morning     Physical Exam:   BP 108/68   Pulse 66   SpO2 93%   Salient findings:  CN II-XII intact Bilateral EAC clear and TM intact with well pneumatized middle ear spaces Anterior rhinoscopy: Septum midline; bilateral inferior turbinates with mild hypertrophy TFL was indicated to better evaluate the proximal airway, given the patient's history and exam findings, and is detailed below. No lesions of oral cavity/oropharynx No obviously palpable neck masses/lymphadenopathy/thyromegaly No respiratory distress or stridor ABDOMEN: Soft,  possible hernia at incision site   Seprately Identifiable Procedures:  Prior to initiating any procedures, risks/benefits/alternatives were explained to the patient and verbal consent obtained.  Procedure Note (10/12/2024) Pre-procedure diagnosis:  vocal fold nodule  Post-procedure diagnosis: same  Procedure: Transnasal Fiberoptic Laryngoscopy, CPT 31575 - Mod 25 Indication: globus sensation Complications: None apparent EBL: 0 mL  The procedure was undertaken to further evaluate the patient's complaint of globus sensation, with mirror exam inadequate for  appropriate examination due to gag reflex and poor patient tolerance  Procedure:  Patient was identified as correct patient. Verbal consent was obtained. The nose was sprayed with oxymetazoline and 4% lidocaine . The The flexible laryngoscope was passed through the nose to view the nasal cavity, pharynx (oropharynx, hypopharynx) and larynx.  The larynx was examined at rest and during multiple phonatory tasks. Documentation was obtained and reviewed with patient. The scope was removed. The patient tolerated the procedure well.  Findings: The nasal cavity and nasopharynx did not reveal any masses or lesions, mucosa appeared to be without obvious lesions. The tongue base, pharyngeal walls, piriform sinuses, vallecula, epiglottis and postcricoid region are normal in appearance EXCEPT: mid fold nodule has since resolved. The visualized portion of the subglottis and proximal trachea is widely patent. The vocal folds are mobile bilaterally.   Electronically signed by: Hadassah JAYSON Parody, MD 10/12/2024 2:00 PM   Impression & Plans:  James Collier is a 68 y.o. male with   1. Globus sensation   2. Vocal fold nodule     Assessment and Plan Assessment & Plan Globus sensation and laryngopharyngeal reflux Chronic throat sensation likely due to LPR  and globus. Reflux medications partially effective. Discussed treatment controversies and psychological factors. Advised persistence of symptoms. - Continue Protonix  and famotidine  as previously prescribed as they seem to be helpful  - Continue Reflux Gourmet gel if helpful   Vocal cord nodule - resolved  Follow-up as needed   See below regarding exact medications prescribed this encounter including dosages and route: No orders of the defined types were placed in this encounter.   Thank you for allowing me the opportunity to care for your patient. Please do not hesitate to contact me should you have any other questions.  Sincerely, Hadassah Parody,  MD Otolaryngologist (ENT), [facility] Phone: [phone] Fax: [phone]

## 2024-10-14 DIAGNOSIS — M25561 Pain in right knee: Secondary | ICD-10-CM | POA: Diagnosis not present

## 2024-10-14 DIAGNOSIS — M25562 Pain in left knee: Secondary | ICD-10-CM | POA: Diagnosis not present

## 2024-10-14 DIAGNOSIS — M170 Bilateral primary osteoarthritis of knee: Secondary | ICD-10-CM | POA: Diagnosis not present

## 2024-10-15 ENCOUNTER — Encounter: Payer: Self-pay | Admitting: Neurology

## 2024-10-21 DIAGNOSIS — M1712 Unilateral primary osteoarthritis, left knee: Secondary | ICD-10-CM | POA: Diagnosis not present

## 2024-10-29 NOTE — Pre-Procedure Instructions (Signed)
 Surgical Instructions   Your procedure is scheduled on November 09, 2024. Report to [facility] Main Entrance A at 11:15 A.M., then check in with the Admitting office. Any questions or running late day of surgery: call [phone]  Questions prior to your surgery date: call [phone], Monday-Friday, 8am-4pm. If you experience any cold or flu symptoms such as cough, fever, chills, shortness of breath, etc. between now and your scheduled surgery, please notify us  at the above number.     Remember:  Do not eat after midnight the night before your surgery   You may drink clear liquids until 10:15 AM the morning of your surgery.   Clear liquids allowed are: Water, Non-Citrus Juices (without pulp), Carbonated Beverages, Clear Tea (no milk, honey, etc.), Black Coffee Only (NO MILK, CREAM OR POWDERED CREAMER of any kind), and Gatorade.  Patient Instructions  The night before surgery:  No food after midnight. ONLY clear liquids after midnight  The day of surgery (if you do NOT have diabetes):  Drink ONE (1) Pre-Surgery Clear Ensure by 10:15 AM the morning of surgery. Drink in one sitting. Do not sip.  This drink was given to you during your hospital  pre-op appointment visit.  Nothing else to drink after completing the  Pre-Surgery Clear Ensure.         If you have questions, please contact your surgeon's office.   Take these medicines the morning of surgery with A SIP OF WATER: acetaminophen  (TYLENOL )  carbidopa -levodopa  (SINEMET  IR)  cetirizine (ZYRTEC)  docusate sodium  (COLACE)  entacapone  (COMTAN )  pantoprazole  (PROTONIX )    May take these medicines IF NEEDED: levocetirizine (XYZAL )    One week prior to surgery, STOP taking any Aspirin (unless otherwise instructed by your surgeon) Aleve, Naproxen, Ibuprofen , Motrin , Advil , Goody's, BC's, all herbal medications, fish oil, and non-prescription vitamins.                     Do NOT Smoke (Tobacco/Vaping) for 24 hours  prior to your procedure.  If you use a CPAP at night, you may bring your mask/headgear for your overnight stay.   You will be asked to remove any contacts, glasses, piercing's, hearing aid's, dentures/partials prior to surgery. Please bring cases for these items if needed.    Patients discharged the day of surgery will not be allowed to drive home, and someone needs to stay with them for 24 hours.  SURGICAL WAITING ROOM VISITATION Patients may have no more than 2 support people in the waiting area - these visitors may rotate.   Pre-op nurse will coordinate an appropriate time for 1 ADULT support person, who may not rotate, to accompany patient in pre-op.  Children under the age of 13 must have an adult with them who is not the patient and must remain in the main waiting area with an adult.  If the patient needs to stay at the hospital during part of their recovery, the visitor guidelines for inpatient rooms apply.  Please refer to the [facility] website for the visitor guidelines for any additional information.   If you received a COVID test during your pre-op visit  it is requested that you wear a mask when out in public, stay away from anyone that may not be feeling well and notify your surgeon if you develop symptoms. If you have been in contact with anyone that has tested positive in the last 10 days please notify you surgeon.      Pre-operative CHG Bathing  Instructions   You can play a key role in reducing the risk of infection after surgery. Your skin needs to be as free of germs as possible. You can reduce the number of germs on your skin by washing with CHG (chlorhexidine  gluconate) soap before surgery. CHG is an antiseptic soap that kills germs and continues to kill germs even after washing.   DO NOT use if you have an allergy  to chlorhexidine /CHG or antibacterial soaps. If your skin becomes reddened or irritated, stop using the CHG and notify one of our RNs at [phone].               TAKE A SHOWER THE NIGHT BEFORE SURGERY   Please keep in mind the following:  DO NOT shave, including legs and underarms, 48 hours prior to surgery.   You may shave your face before/day of surgery.  Place clean sheets on your bed the night before surgery Use a clean washcloth (not used since being washed) for shower. DO NOT sleep with pet's night before surgery.  CHG Shower Instructions:  Wash your face and private area with normal soap. If you choose to wash your hair, wash first with your normal shampoo.  After you use shampoo/soap, rinse your hair and body thoroughly to remove shampoo/soap residue.  Turn the water OFF and apply half the bottle of CHG soap to a CLEAN washcloth.  Apply CHG soap ONLY FROM YOUR NECK DOWN TO YOUR TOES (washing for 3-5 minutes)  DO NOT use CHG soap on face, private areas, open wounds, or sores.  Pay special attention to the area where your surgery is being performed.  If you are having back surgery, having someone wash your back for you may be helpful. Wait 2 minutes after CHG soap is applied, then you may rinse off the CHG soap.  Pat dry with a clean towel  Put on clean pajamas    Additional instructions for the day of surgery: If you choose, you may shower the morning of surgery with an antibacterial soap.  DO NOT APPLY any lotions, deodorants, cologne, or perfumes.   Do not wear jewelry or makeup Do not wear nail polish, gel polish, artificial nails, or any other type of covering on natural nails (fingers and toes) Do not bring valuables to the hospital. [facility] is not responsible for valuables/personal belongings. Put on clean/comfortable clothes.  Please brush your teeth.  Ask your nurse before applying any prescription medications to the skin.

## 2024-10-30 ENCOUNTER — Encounter (HOSPITAL_COMMUNITY): Payer: Self-pay

## 2024-10-30 ENCOUNTER — Inpatient Hospital Stay (HOSPITAL_COMMUNITY): Admission: RE | Admit: 2024-10-30 | Discharge: 2024-10-30 | Attending: General Surgery

## 2024-10-30 ENCOUNTER — Other Ambulatory Visit: Payer: Self-pay

## 2024-10-30 VITALS — BP 144/83 | HR 62 | Temp 98.4°F | Resp 18 | Ht 70.0 in | Wt 219.3 lb

## 2024-10-30 DIAGNOSIS — Z01818 Encounter for other preprocedural examination: Secondary | ICD-10-CM | POA: Diagnosis not present

## 2024-10-30 HISTORY — DX: Gastro-esophageal reflux disease without esophagitis: K21.9

## 2024-10-30 LAB — BASIC METABOLIC PANEL WITH GFR
Anion gap: 4 — ABNORMAL LOW (ref 5–15)
BUN: 15 mg/dL (ref 8–23)
CO2: 32 mmol/L (ref 22–32)
Calcium: 9.6 mg/dL (ref 8.9–10.3)
Chloride: 102 mmol/L (ref 98–111)
Creatinine, Ser: 1.07 mg/dL (ref 0.61–1.24)
GFR, Estimated: >60 mL/min (ref >60)
Glucose, Bld: 105 mg/dL — ABNORMAL HIGH (ref 70–99)
Potassium: 4.7 mmol/L (ref 3.5–5.1)
Sodium: 138 mmol/L (ref 135–145)

## 2024-10-30 LAB — CBC
HCT: 44.4 % (ref 39.0–52.0)
Hemoglobin: 15.1 g/dL (ref 13.0–17.0)
MCH: 31.7 pg (ref 26.0–34.0)
MCHC: 34 g/dL (ref 30.0–36.0)
MCV: 93.3 fL (ref 80.0–100.0)
Platelets: 217 K/uL (ref 150–400)
RBC: 4.76 MIL/uL (ref 4.22–5.81)
RDW: 12.8 % (ref 11.5–15.5)
WBC: 6.4 K/uL (ref 4.0–10.5)
nRBC: 0 % (ref 0.0–0.2)

## 2024-10-30 NOTE — Progress Notes (Signed)
 PCP - Dr. Wolm Scarlet Cardiologist -   PPM/ICD - denies Device Orders - na Rep Notified - na  Chest x-ray - na EKG - 01/17/2024 Stress Test -  ECHO - 09/05/2022 Cardiac Cath -   Sleep Study - denies CPAP - na  Non-diabetic  Blood Thinner Instructions: denies Aspirin Instructions:denies  ERAS Protcol - Ensure until 1015  Anesthesia review: No  Patient denies shortness of breath, fever, cough and chest pain at PAT appointment  All instructions explained to the patient, with a verbal understanding of the material. Patient agrees to go over the instructions while at home for a better understanding. Patient also instructed to self quarantine after being tested for COVID-19. The opportunity to ask questions was provided.

## 2024-11-09 ENCOUNTER — Other Ambulatory Visit: Payer: Self-pay

## 2024-11-09 ENCOUNTER — Encounter (HOSPITAL_COMMUNITY): Admission: RE | Disposition: A | Payer: Self-pay | Source: Home / Self Care | Attending: General Surgery

## 2024-11-09 ENCOUNTER — Ambulatory Visit (HOSPITAL_COMMUNITY): Admitting: Registered Nurse

## 2024-11-09 ENCOUNTER — Ambulatory Visit (HOSPITAL_COMMUNITY)
Admission: RE | Admit: 2024-11-09 | Discharge: 2024-11-10 | Disposition: A | Attending: General Surgery | Admitting: General Surgery

## 2024-11-09 ENCOUNTER — Encounter (HOSPITAL_COMMUNITY): Payer: Self-pay | Admitting: General Surgery

## 2024-11-09 DIAGNOSIS — Z8719 Personal history of other diseases of the digestive system: Secondary | ICD-10-CM

## 2024-11-09 DIAGNOSIS — Z79899 Other long term (current) drug therapy: Secondary | ICD-10-CM | POA: Diagnosis not present

## 2024-11-09 DIAGNOSIS — K219 Gastro-esophageal reflux disease without esophagitis: Secondary | ICD-10-CM | POA: Diagnosis not present

## 2024-11-09 DIAGNOSIS — G20A1 Parkinson's disease without dyskinesia, without mention of fluctuations: Secondary | ICD-10-CM | POA: Diagnosis not present

## 2024-11-09 DIAGNOSIS — R2681 Unsteadiness on feet: Secondary | ICD-10-CM | POA: Diagnosis not present

## 2024-11-09 DIAGNOSIS — M6281 Muscle weakness (generalized): Secondary | ICD-10-CM | POA: Diagnosis not present

## 2024-11-09 DIAGNOSIS — I517 Cardiomegaly: Secondary | ICD-10-CM | POA: Diagnosis not present

## 2024-11-09 DIAGNOSIS — K432 Incisional hernia without obstruction or gangrene: Secondary | ICD-10-CM

## 2024-11-09 DIAGNOSIS — R2689 Other abnormalities of gait and mobility: Secondary | ICD-10-CM | POA: Diagnosis not present

## 2024-11-09 DIAGNOSIS — I351 Nonrheumatic aortic (valve) insufficiency: Secondary | ICD-10-CM | POA: Diagnosis not present

## 2024-11-09 HISTORY — PX: INCISIONAL HERNIA REPAIR: SHX193

## 2024-11-09 HISTORY — PX: INSERTION OF MESH: SHX5868

## 2024-11-09 SURGERY — REPAIR, HERNIA, INCISIONAL, LAPAROSCOPIC
Anesthesia: General | Site: Abdomen

## 2024-11-09 MED ORDER — BUPIVACAINE-EPINEPHRINE (PF) 0.25% -1:200000 IJ SOLN
INTRAMUSCULAR | Status: AC
  Filled 2024-11-09: qty 30

## 2024-11-09 MED ORDER — PHENYLEPHRINE HCL-NACL 20-0.9 MG/250ML-% IV SOLN
INTRAVENOUS | Status: DC | PRN
  Administered 2024-11-09: 13:00:00 30 ug/min via INTRAVENOUS

## 2024-11-09 MED ORDER — LABETALOL HCL 5 MG/ML IV SOLN
INTRAVENOUS | Status: AC
  Filled 2024-11-09: qty 4

## 2024-11-09 MED ORDER — ACETAMINOPHEN 325 MG PO TABS
650.0000 mg | ORAL_TABLET | Freq: Four times a day (QID) | ORAL | Status: DC
  Administered 2024-11-10 (×3): 650 mg via ORAL
  Filled 2024-11-09 (×3): qty 2

## 2024-11-09 MED ORDER — DIPHENHYDRAMINE HCL 12.5 MG/5ML PO ELIX: 12.5000 mg | ORAL_SOLUTION | Freq: Four times a day (QID) | ORAL | Status: DC | PRN

## 2024-11-09 MED ORDER — LACTATED RINGERS IV SOLN: INTRAVENOUS | Status: DC

## 2024-11-09 MED ORDER — ENSURE PRE-SURGERY PO LIQD: 296.0000 mL | Freq: Once | ORAL | Status: DC

## 2024-11-09 MED ORDER — ENOXAPARIN SODIUM 40 MG/0.4ML IJ SOSY
40.0000 mg | PREFILLED_SYRINGE | INTRAMUSCULAR | Status: DC
  Administered 2024-11-10: 09:00:00 40 mg via SUBCUTANEOUS
  Filled 2024-11-09: qty 0.4

## 2024-11-09 MED ORDER — FENTANYL CITRATE (PF) 100 MCG/2ML IJ SOLN
INTRAMUSCULAR | Status: AC
  Filled 2024-11-09: qty 2

## 2024-11-09 MED ORDER — ONDANSETRON 4 MG PO TBDP: 4.0000 mg | ORAL_TABLET | Freq: Four times a day (QID) | ORAL | Status: DC | PRN

## 2024-11-09 MED ORDER — CARBIDOPA-LEVODOPA 25-100 MG PO TABS
2.0000 | ORAL_TABLET | ORAL | Status: AC
  Administered 2024-11-09: 16:00:00 2 via ORAL
  Filled 2024-11-09: qty 2

## 2024-11-09 MED ORDER — LIDOCAINE 2% (20 MG/ML) 5 ML SYRINGE
INTRAMUSCULAR | Status: DC | PRN
  Administered 2024-11-09: 13:00:00 100 mg via INTRAVENOUS

## 2024-11-09 MED ORDER — ONDANSETRON HCL 4 MG/2ML IJ SOLN: 4.0000 mg | Freq: Four times a day (QID) | INTRAMUSCULAR | Status: DC | PRN

## 2024-11-09 MED ORDER — OXYCODONE HCL 5 MG PO TABS: 5.0000 mg | ORAL_TABLET | Freq: Once | ORAL | Status: DC | PRN

## 2024-11-09 MED ORDER — CARBIDOPA-LEVODOPA 25-100 MG PO TABS: 1.0000 | ORAL_TABLET | Freq: Every day | ORAL | Status: DC

## 2024-11-09 MED ORDER — LEVOCETIRIZINE DIHYDROCHLORIDE 5 MG PO TABS: 5.0000 mg | ORAL_TABLET | Freq: Every day | ORAL | Status: DC | PRN

## 2024-11-09 MED ORDER — DOCUSATE SODIUM 100 MG PO CAPS
100.0000 mg | ORAL_CAPSULE | Freq: Every day | ORAL | Status: DC
  Administered 2024-11-10: 09:00:00 100 mg via ORAL
  Filled 2024-11-09: qty 1

## 2024-11-09 MED ORDER — BUPIVACAINE-EPINEPHRINE (PF) 0.25% -1:200000 IJ SOLN
INTRAMUSCULAR | Status: DC | PRN
  Administered 2024-11-09: 14:00:00 21 mL

## 2024-11-09 MED ORDER — SUCCINYLCHOLINE CHLORIDE 200 MG/10ML IV SOSY
PREFILLED_SYRINGE | INTRAVENOUS | Status: DC | PRN
  Administered 2024-11-09: 13:00:00 100 mg via INTRAVENOUS

## 2024-11-09 MED ORDER — LORATADINE 10 MG PO TABS: 10.0000 mg | ORAL_TABLET | Freq: Every day | ORAL | Status: DC | PRN

## 2024-11-09 MED ORDER — MORPHINE SULFATE (PF) 4 MG/ML IV SOLN: 4.0000 mg | INTRAVENOUS | Status: DC | PRN

## 2024-11-09 MED ORDER — FENTANYL CITRATE (PF) 250 MCG/5ML IJ SOLN
INTRAMUSCULAR | Status: DC | PRN
  Administered 2024-11-09 (×3): 50 ug via INTRAVENOUS

## 2024-11-09 MED ORDER — ORAL CARE MOUTH RINSE: 15.0000 mL | Freq: Once | OROMUCOSAL | Status: AC

## 2024-11-09 MED ORDER — ONDANSETRON HCL 4 MG/2ML IJ SOLN
INTRAMUSCULAR | Status: DC | PRN
  Administered 2024-11-09: 13:00:00 4 mg via INTRAVENOUS

## 2024-11-09 MED ORDER — FENTANYL CITRATE (PF) 100 MCG/2ML IJ SOLN
25.0000 ug | INTRAMUSCULAR | Status: DC | PRN
  Administered 2024-11-09 (×2): 50 ug via INTRAVENOUS

## 2024-11-09 MED ORDER — PROPOFOL 10 MG/ML IV BOLUS
INTRAVENOUS | Status: AC
  Filled 2024-11-09: qty 20

## 2024-11-09 MED ORDER — CARBIDOPA-LEVODOPA 25-100 MG PO TABS
1.0000 | ORAL_TABLET | Freq: Every day | ORAL | Status: DC
  Administered 2024-11-09: 18:00:00 1 via ORAL
  Filled 2024-11-09: qty 1

## 2024-11-09 MED ORDER — ACETAMINOPHEN 500 MG PO TABS
1000.0000 mg | ORAL_TABLET | ORAL | Status: DC
  Filled 2024-11-09: qty 2

## 2024-11-09 MED ORDER — GUAIFENESIN ER 600 MG PO TB12
600.0000 mg | ORAL_TABLET | Freq: Two times a day (BID) | ORAL | Status: DC | PRN
  Administered 2024-11-09: 23:00:00 600 mg via ORAL
  Filled 2024-11-09: qty 1

## 2024-11-09 MED ORDER — CHLORHEXIDINE GLUCONATE 0.12 % MT SOLN
15.0000 mL | Freq: Once | OROMUCOSAL | Status: AC
  Administered 2024-11-09: 12:00:00 15 mL via OROMUCOSAL
  Filled 2024-11-09: qty 15

## 2024-11-09 MED ORDER — HYDRALAZINE HCL 20 MG/ML IJ SOLN
INTRAMUSCULAR | Status: AC
  Filled 2024-11-09: qty 1

## 2024-11-09 MED ORDER — ONDANSETRON HCL 4 MG/2ML IJ SOLN
INTRAMUSCULAR | Status: AC
  Filled 2024-11-09: qty 2

## 2024-11-09 MED ORDER — ENTACAPONE 200 MG PO TABS
200.0000 mg | ORAL_TABLET | ORAL | Status: DC
  Administered 2024-11-09 – 2024-11-10 (×3): 200 mg via ORAL
  Filled 2024-11-09 (×5): qty 1

## 2024-11-09 MED ORDER — SUCCINYLCHOLINE CHLORIDE 200 MG/10ML IV SOSY
PREFILLED_SYRINGE | INTRAVENOUS | Status: AC
  Filled 2024-11-09: qty 10

## 2024-11-09 MED ORDER — CHLORHEXIDINE GLUCONATE CLOTH 2 % EX PADS: 6.0000 | MEDICATED_PAD | Freq: Once | CUTANEOUS | Status: DC

## 2024-11-09 MED ORDER — PANTOPRAZOLE SODIUM 40 MG IV SOLR
40.0000 mg | Freq: Every day | INTRAVENOUS | Status: DC
  Administered 2024-11-09: 23:00:00 40 mg via INTRAVENOUS
  Filled 2024-11-09: qty 10

## 2024-11-09 MED ORDER — ROCURONIUM BROMIDE 10 MG/ML (PF) SYRINGE
PREFILLED_SYRINGE | INTRAVENOUS | Status: DC | PRN
  Administered 2024-11-09: 13:00:00 40 mg via INTRAVENOUS
  Administered 2024-11-09: 14:00:00 10 mg via INTRAVENOUS

## 2024-11-09 MED ORDER — OXYCODONE HCL 5 MG/5ML PO SOLN: 5.0000 mg | Freq: Once | ORAL | Status: DC | PRN

## 2024-11-09 MED ORDER — AZELASTINE HCL 0.1 % NA SOLN
2.0000 | Freq: Every day | NASAL | Status: DC
  Filled 2024-11-09: qty 30

## 2024-11-09 MED ORDER — CEFAZOLIN SODIUM-DEXTROSE 2-4 GM/100ML-% IV SOLN
2.0000 g | INTRAVENOUS | Status: AC
  Administered 2024-11-09: 13:00:00 2 g via INTRAVENOUS
  Filled 2024-11-09: qty 100

## 2024-11-09 MED ORDER — SUGAMMADEX SODIUM 200 MG/2ML IV SOLN
INTRAVENOUS | Status: DC | PRN
  Administered 2024-11-09: 14:00:00 400 mg via INTRAVENOUS

## 2024-11-09 MED ORDER — OXYCODONE HCL 5 MG PO TABS
5.0000 mg | ORAL_TABLET | ORAL | Status: DC | PRN
  Administered 2024-11-09 (×2): 10 mg via ORAL
  Administered 2024-11-10: 13:00:00 5 mg via ORAL
  Administered 2024-11-10: 05:00:00 10 mg via ORAL
  Filled 2024-11-09 (×2): qty 2
  Filled 2024-11-09: qty 1
  Filled 2024-11-09: qty 2

## 2024-11-09 MED ORDER — DIPHENHYDRAMINE HCL 50 MG/ML IJ SOLN: 12.5000 mg | Freq: Four times a day (QID) | INTRAMUSCULAR | Status: DC | PRN

## 2024-11-09 MED ORDER — TRAMADOL HCL 50 MG PO TABS: 50.0000 mg | ORAL_TABLET | Freq: Four times a day (QID) | ORAL | Status: DC | PRN

## 2024-11-09 MED ORDER — HYDRALAZINE HCL 20 MG/ML IJ SOLN: 10.0000 mg | INTRAMUSCULAR | Status: DC | PRN

## 2024-11-09 MED ORDER — METHOCARBAMOL 750 MG PO TABS
750.0000 mg | ORAL_TABLET | Freq: Three times a day (TID) | ORAL | Status: DC
  Administered 2024-11-09 – 2024-11-10 (×3): 750 mg via ORAL
  Filled 2024-11-09 (×3): qty 1

## 2024-11-09 MED ORDER — CARBIDOPA-LEVODOPA 25-100 MG PO TABS
2.0000 | ORAL_TABLET | ORAL | Status: DC
  Administered 2024-11-10 (×3): 2 via ORAL
  Filled 2024-11-09 (×4): qty 2

## 2024-11-09 MED ORDER — ROCURONIUM BROMIDE 10 MG/ML (PF) SYRINGE
PREFILLED_SYRINGE | INTRAVENOUS | Status: AC
  Filled 2024-11-09: qty 10

## 2024-11-09 MED ORDER — 0.9 % SODIUM CHLORIDE (POUR BTL) OPTIME
TOPICAL | Status: DC | PRN
  Administered 2024-11-09: 13:00:00 1000 mL

## 2024-11-09 MED ORDER — DEXAMETHASONE SOD PHOSPHATE PF 10 MG/ML IJ SOLN
INTRAMUSCULAR | Status: DC | PRN
  Administered 2024-11-09: 13:00:00 5 mg via INTRAVENOUS

## 2024-11-09 MED ORDER — PHENYLEPHRINE 80 MCG/ML (10ML) SYRINGE FOR IV PUSH (FOR BLOOD PRESSURE SUPPORT)
PREFILLED_SYRINGE | INTRAVENOUS | Status: AC
  Filled 2024-11-09: qty 10

## 2024-11-09 MED ORDER — PROPOFOL 10 MG/ML IV BOLUS
INTRAVENOUS | Status: DC | PRN
  Administered 2024-11-09: 13:00:00 150 mg via INTRAVENOUS

## 2024-11-09 MED ORDER — ACETAMINOPHEN 10 MG/ML IV SOLN: 1000.0000 mg | Freq: Once | INTRAVENOUS | Status: DC | PRN

## 2024-11-09 SURGICAL SUPPLY — 34 items
BLADE CLIPPER SURG (BLADE) IMPLANT
CHLORAPREP W/TINT 26 (MISCELLANEOUS) ×1 IMPLANT
COVER SURGICAL LIGHT HANDLE (MISCELLANEOUS) ×1 IMPLANT
DERMABOND ADVANCED .7 DNX12 (GAUZE/BANDAGES/DRESSINGS) ×1 IMPLANT
DEVICE SECURE STRAP 25 ABSORB (INSTRUMENTS) ×1 IMPLANT
ELECTRODE REM PT RTRN 9FT ADLT (ELECTROSURGICAL) ×1 IMPLANT
GLOVE BIO SURGEON STRL SZ8 (GLOVE) ×1 IMPLANT
GLOVE BIOGEL PI IND STRL 8 (GLOVE) ×1 IMPLANT
GOWN STRL REUS W/ TWL LRG LVL3 (GOWN DISPOSABLE) ×2 IMPLANT
GOWN STRL REUS W/ TWL XL LVL3 (GOWN DISPOSABLE) ×1 IMPLANT
GRASPER SUT TROCAR 14GX15 (MISCELLANEOUS) ×1 IMPLANT
KIT BASIN OR (CUSTOM PROCEDURE TRAY) ×1 IMPLANT
KIT TURNOVER KIT B (KITS) ×1 IMPLANT
MARKER SKIN DUAL TIP RULER LAB (MISCELLANEOUS) ×1 IMPLANT
MESH OVITEX LPR PERM 15X15 4L (Mesh General) IMPLANT
NDL 22X1.5 STRL (OR ONLY) (MISCELLANEOUS) ×1 IMPLANT
NDL INSUFFLATION 14GA 120MM (NEEDLE) IMPLANT
NDL SPNL 22GX3.5 QUINCKE BK (NEEDLE) ×1 IMPLANT
PAD ARMBOARD POSITIONER FOAM (MISCELLANEOUS) ×2 IMPLANT
SCISSORS LAP 5X35 DISP (ENDOMECHANICALS) ×1 IMPLANT
SET TUBE SMOKE EVAC HIGH FLOW (TUBING) ×1 IMPLANT
SHEARS HARMONIC 36 ACE (MISCELLANEOUS) IMPLANT
SLEEVE Z-THREAD 5X100MM (TROCAR) ×1 IMPLANT
SOLN 0.9% NACL POUR BTL 1000ML (IV SOLUTION) ×1 IMPLANT
SOLN STERILE WATER BTL 1000 ML (IV SOLUTION) ×1 IMPLANT
SUT NOVA NAB DX-16 0-1 5-0 T12 (SUTURE) IMPLANT
SUT VIC AB 4-0 PS2 27 (SUTURE) ×1 IMPLANT
TOWEL GREEN STERILE (TOWEL DISPOSABLE) ×1 IMPLANT
TRAY FOLEY W/BAG SLVR 16FR ST (SET/KITS/TRAYS/PACK) ×1 IMPLANT
TRAY LAPAROSCOPIC MC (CUSTOM PROCEDURE TRAY) ×1 IMPLANT
TROCAR 11X100 Z THREAD (TROCAR) ×1 IMPLANT
TROCAR XCEL NON-BLD 5MMX100MML (ENDOMECHANICALS) ×1 IMPLANT
TROCAR Z-THREAD OPTICAL 5X100M (TROCAR) ×1 IMPLANT
WARMER LAPAROSCOPE (MISCELLANEOUS) ×1 IMPLANT

## 2024-11-09 NOTE — Anesthesia Procedure Notes (Signed)
 Procedure Name: Intubation Date/Time: 11/09/2024 1:01 PM  Performed by: Virgil Ee, CRNAPre-anesthesia Checklist: Patient identified, Patient being monitored, Timeout performed, Emergency Drugs available and Suction available Patient Re-evaluated:Patient Re-evaluated prior to induction Oxygen Delivery Method: Circle system utilized Preoxygenation: Pre-oxygenation with 100% oxygen Induction Type: IV induction Ventilation: Mask ventilation without difficulty Laryngoscope Size: Mac and 4 Grade View: Grade II Tube type: Oral Tube size: 7.5 mm Number of attempts: 1 Airway Equipment and Method: Stylet Placement Confirmation: ETT inserted through vocal cords under direct vision, positive ETCO2 and breath sounds checked- equal and bilateral Secured at: 23 cm Tube secured with: Tape Dental Injury: Teeth and Oropharynx as per pre-operative assessment

## 2024-11-09 NOTE — Transfer of Care (Signed)
 Immediate Anesthesia Transfer of Care Note  Patient: James Collier  Procedure(s) Performed: REPAIR, HERNIA, INCISIONAL, LAPAROSCOPIC (Abdomen) INSERTION OF MESH (Abdomen)  Patient Location: PACU  Anesthesia Type:General  Level of Consciousness: awake  Airway & Oxygen Therapy: Patient Spontanous Breathing  Post-op Assessment: Report given to RN and Post -op Vital signs reviewed and stable  Post vital signs: Reviewed and stable  Last Vitals:  Vitals Value Taken Time  BP 136/70 11/09/24 14:30  Temp 35.8 C 11/09/24 14:24  Pulse 82 11/09/24 14:31  Resp 17 11/09/24 14:31  SpO2 92 % 11/09/24 14:31  Vitals shown include unfiled device data.  Last Pain:  Vitals:   11/09/24 1152  TempSrc:   PainSc: 0-No pain         Complications: No notable events documented.

## 2024-11-09 NOTE — Anesthesia Preprocedure Evaluation (Signed)
 Anesthesia Evaluation  Patient identified by MRN, date of birth, ID band Patient awake    Reviewed: Allergy  & Precautions, NPO status , Patient's Chart, lab work & pertinent test results  History of Anesthesia Complications Negative for: history of anesthetic complications  Airway Mallampati: III  TM Distance: <3 FB Neck ROM: Full    Dental  (+) Dental Advisory Given, Teeth Intact   Pulmonary neg shortness of breath, neg sleep apnea, neg COPD, neg recent URI   breath sounds clear to auscultation       Cardiovascular  Rhythm:Regular   1. Left ventricular ejection fraction, by estimation, is 60 to 65%. The  left ventricle has normal function. The left ventricle has no regional  wall motion abnormalities. There is mild left ventricular hypertrophy.  Left ventricular diastolic parameters  are consistent with Grade I diastolic dysfunction (impaired relaxation).  The average left ventricular global longitudinal strain is -15.1 %.   2. Right ventricular systolic function is mildly reduced. The right  ventricular size is moderately enlarged. There is normal pulmonary artery  systolic pressure. The estimated right ventricular systolic pressure is  22.5 mmHg.   3. Left atrial size was mildly dilated.   4. The mitral valve is normal in structure. Mild mitral valve  regurgitation. No evidence of mitral stenosis.   5. The aortic valve is tricuspid. Aortic valve regurgitation is mild. No  aortic stenosis is present. Aortic regurgitation PHT measures 502 msec.   6. The inferior vena cava is normal in size with greater than 50%  respiratory variability, suggesting right atrial pressure of 3 mmHg.     Neuro/Psych neg Seizures    GI/Hepatic Neg liver ROS,GERD  Poorly Controlled and Medicated,,  Endo/Other  negative endocrine ROS    Renal/GU negative Renal ROSLab Results      Component                Value               Date                       NA                       138                 10/30/2024                K                        4.7                 10/30/2024                CO2                      32                  10/30/2024                GLUCOSE                  105 (H)             10/30/2024                BUN  15                  10/30/2024                CREATININE               1.07                10/30/2024                CALCIUM                  9.6                 10/30/2024                GFR                      70.90               07/30/2023                GFRNONAA                 >60                 10/30/2024                Musculoskeletal   Abdominal   Peds  Hematology negative hematology ROS (+) Lab Results      Component                Value               Date                      WBC                      6.4                 10/30/2024                HGB                      15.1                10/30/2024                HCT                      44.4                10/30/2024                MCV                      93.3                10/30/2024                PLT                      217                 10/30/2024               Anesthesia Other Findings   Reproductive/Obstetrics  Anesthesia Physical Anesthesia Plan  ASA: 3  Anesthesia Plan: General   Post-op Pain Management: Ofirmev  IV (intra-op)*   Induction: Intravenous  PONV Risk Score and Plan: 3 and Ondansetron  and Dexamethasone   Airway Management Planned: Oral ETT  Additional Equipment: None  Intra-op Plan:   Post-operative Plan: Extubation in OR  Informed Consent: I have reviewed the patients History and Physical, chart, labs and discussed the procedure including the risks, benefits and alternatives for the proposed anesthesia with the patient or authorized representative who has indicated his/her understanding and acceptance.      Dental advisory given  Plan Discussed with: CRNA  Anesthesia Plan Comments:          Anesthesia Quick Evaluation

## 2024-11-09 NOTE — Plan of Care (Signed)

## 2024-11-09 NOTE — Op Note (Signed)
 11/09/2024  2:11 PM  PATIENT:  James Collier  68 y.o. male  PRE-OPERATIVE DIAGNOSIS:  INCISIONAL HERNIA  POST-OPERATIVE DIAGNOSIS:  INCISIONAL HERNIA  PROCEDURE:  Procedures: LAPAROSCOPIC REPAIR INCISIONAL HERNIA WITH MESH INSERTION OF MESH OVITEX 15x15CM LPR HERNIA DEFECT 7CM X 7CM  SURGEON:  Dann Hummer, MD  ASSISTANTS: Orie Silversmith. MD  ANESTHESIA:   local and general  EBL:  Total I/O In: 100 [IV Piggyback:100] Out: 270 [Urine:250; Blood:20]  BLOOD ADMINISTERED:none  DRAINS: none   SPECIMEN:  No Specimen  DISPOSITION OF SPECIMEN:  N/A  COUNTS:  YES  DICTATION: .Dragon Dictation Procedure detail: Informed consent was obtained.  He received intravenous antibiotics.  He was brought to the operating room and general endotracheal anesthesia was administered by the anesthesia staff.  Foley catheter was placed by nursing.  His abdomen was prepped and draped in a sterile fashion.  We did a timeout procedure.  I injected local along his costal margin in the left upper quadrant.  I then made a small incision and then placed a Veress needle.  I confirmed 2 clicks and then it easily flushed with saline.  Hooked up to the insufflation and the abdomen insufflated without difficulty.  I then infiltrated local omentum small incision on the left upper quadrant and using an Optiview technique with a 5 mm port I inserted the port without difficulty.  There were no complications.  The abdomen was explored.  The hernia defect was easily visible in the periumbilical region and there was a loop of small bowel and a piece of omentum stuck up to the anterior abdominal wall.  No other issues.  Under direct vision, I then placed a 11 mm left lower quadrant port and a 5 mm right lower quadrant port and a 5 mm right upper quadrant port.  Local was used at each port site.  I then gently took down the omental adhesions bluntly.  I then sharply took down adhesions to the small bowel loop staying well  away from the bowel wall.  It came out very easily and there were no complications with that.  The hernia defect was then measured out and measured to be 7 x 7 cm.  Allowing for a 4 cm circumferential overlay, I selected a 15 cm x 15 cm round OviTex mesh.  This was marked for orientation.  4-0 Novafil sutures were placed at 12:00, 3:00, 9:00, and 6:00.  The mesh was rolled up and inserted via the 11 mm port.  It was unrolled and positioned appropriately abdomen.  I then made a small stab wound at the inferior mark for the 6:00 suture.  I used a PMI to grasp the sutures through 2 separate passes.  I performed a similar procedure at the 3, 9, and 12:00 positions.  Each limb of each suture was grasped via a separate pass.  The sutures were then tied and the mesh laid nice against the anterior abdominal wall.  I then placed 2 concentric rings of secure straps securing it very well.  There was excellent hemostasis.  The mesh laid very nicely against the abdominal wall.  The abdomen was then explored in all quadrants.  No complicating features or bleeding were noted.  The insufflation was released.  The 4 port sites were closed with subcuticular 4-0 Vicryl followed by Dermabond.  Dermabond was also placed at each of the stab sites plus the Veress needle site.  Sponge, needle, and instrument counts were all correct.  He tolerated the  procedure well without apparent complication and was taken recovery in stable condition. PATIENT DISPOSITION:  PACU - hemodynamically stable.   Delay start of Pharmacological VTE agent (>24hrs) due to surgical blood loss or risk of bleeding:  no  Dann Hummer, MD, MPH, FACS Pager: [phone]  12/15/20252:11 PM

## 2024-11-09 NOTE — H&P (Signed)
 James Collier is an 68 y.o. male.   Chief Complaint: Incisional hernia HPI: Patient presents for laparoscopic repair of incisional hernia with mesh.  His symptoms have been similar since I saw him in the office.  He also mentioned his current treatment plan for his reflux.  He has been working with ENT and a allergy  specialist.  Past Medical History:  Diagnosis Date   Allergy     Blood in stool    Cancer (HCC) 11/26/2010   prostate   Eczema    GERD (gastroesophageal reflux disease)    Parkinson disease (HCC)     Past Surgical History:  Procedure Laterality Date   CATARACT EXTRACTION Bilateral    HEMORROIDECTOMY  1986   LAPAROSCOPY N/A 01/18/2024   Procedure: ATTEMPTED LAPAROSCOPY CONVERTED TO OPEN DIAGNOSTIC LAPAROTOMY WITH BOWEL RESCTION;  Surgeon: Sebastian Moles, MD;  Location: [facility] OR;  Service: General;  Laterality: N/A;   PROSTATE SURGERY  2011   TONSILLECTOMY  1965    Family History  Problem Relation Age of Onset   Cancer Mother        pancreatic   Arthritis Father        ?psoriatic   Parkinson's disease Father    Cancer Maternal Aunt        esophgeal   Cancer Paternal Grandmother        ovarian   Ovarian cancer Paternal Grandmother    Cancer Maternal Grandmother    Colon cancer Neg Hx    Social History:  reports that he has never smoked. He has never used smokeless tobacco. He reports current alcohol use of about 3.0 standard drinks of alcohol per week. He reports that he does not use drugs.  Allergies: Allergies[1]  Medications Prior to Admission  Medication Sig Dispense Refill   acetaminophen  (TYLENOL ) 500 MG tablet Take 500 mg by mouth every 6 (six) hours as needed for mild pain (pain score 1-3) or moderate pain (pain score 4-6).     azelastine  (ASTELIN ) 0.1 % nasal spray Place 2 sprays into both nostrils 2 (two) times daily. Use in each nostril as directed (Patient taking differently: Place 2 sprays into both nostrils at bedtime. Use in each nostril as  directed) 30 mL 5   carbidopa -levodopa  (SINEMET  CR) 50-200 MG tablet TAKE 1 TABLET BY MOUTH ONCE DAILY AT BEDTIME 90 tablet 0   carbidopa -levodopa  (SINEMET  IR) 25-100 MG tablet 2 at 7 AM, 2 at 11 AM, 2 at 3 PM, 1 at 6pm 630 tablet 1   cetirizine (ZYRTEC) 10 MG tablet Take 10 mg by mouth daily.     docusate sodium  (COLACE) 100 MG capsule Take 1 capsule (100 mg total) by mouth daily as needed for mild constipation. (Patient taking differently: Take 100 mg by mouth daily.)     entacapone  (COMTAN ) 200 MG tablet Take 1 tablet (200 mg total) by mouth 3 (three) times daily. 7am/11am/3pm with carbidopa /levodopa  270 tablet 1   famotidine  (PEPCID ) 40 MG tablet Take 1 tablet (40 mg total) by mouth at bedtime. 90 tablet 1   guaiFENesin  (MUCINEX ) 600 MG 12 hr tablet Take 600 mg by mouth 2 (two) times daily as needed for to loosen phlegm.     Ibuprofen  (IBU-200 PO) Take 200-400 mg by mouth daily as needed. Advil      levocetirizine (XYZAL ) 5 MG tablet Take 1 tablet (5 mg total) by mouth daily as needed for allergies (Can take an etxra dose during flare ups.). 180 tablet 1   Multiple Vitamins-Minerals (PRESERVISION  AREDS 2 PO) Take 1 tablet by mouth 2 (two) times daily with a meal.     pantoprazole  (PROTONIX ) 40 MG tablet Take 1 tablet (40 mg total) by mouth every morning. 90 tablet 1   Pseudoeph-Doxylamine-DM-APAP (NYQUIL PO) Take 30 mLs by mouth at bedtime as needed (Sleep).     triamcinolone  (NASACORT ) 55 MCG/ACT AERO nasal inhaler Place 1 spray into the nose every morning. (Patient not taking: Reported on 10/26/2024) 50.7 mL 1    No results found for this or any previous visit (from the past 48 hours). No results found.  Review of Systems  Blood pressure (!) 143/85, pulse 75, temperature 98.3 F (36.8 C), temperature source Oral, resp. rate 20, height 5' 10 (1.778 m), weight 96.3 kg, SpO2 97%. Physical Exam Cardiovascular:     Rate and Rhythm: Normal rate and regular rhythm.  Pulmonary:     Effort:  Pulmonary effort is normal.     Breath sounds: Normal breath sounds. No wheezing.  Abdominal:     Palpations: Abdomen is soft.     Hernia: A hernia is present.     Comments: Spontaneously reduced periumbilical incisional hernia, no tenderness  Musculoskeletal:        General: No swelling.  Skin:    General: Skin is warm.  Neurological:     Mental Status: He is alert and oriented to person, place, and time.     Comments: Some Parkinson-like movements  Psychiatric:        Mood and Affect: Mood normal.      Assessment/Plan Incisional hernia -for laparoscopic incisional hernia repair with mesh.  Procedure, risks, and benefits were discussed in detail with patient.  I also discussed the expected postoperative course.  We will keep him overnight and plan on PT/OT evaluations prior to discharge.  I answered his questions.  I also spoke with his wife.  He is agreeable.  Dann FORBES Hummer, MD 11/09/2024, 12:40 PM       [1]  Allergies Allergen Reactions   Aloe     rash

## 2024-11-10 ENCOUNTER — Other Ambulatory Visit (HOSPITAL_COMMUNITY): Payer: Self-pay

## 2024-11-10 ENCOUNTER — Encounter (HOSPITAL_COMMUNITY): Payer: Self-pay | Admitting: General Surgery

## 2024-11-10 ENCOUNTER — Ambulatory Visit: Admitting: Allergy and Immunology

## 2024-11-10 DIAGNOSIS — K432 Incisional hernia without obstruction or gangrene: Secondary | ICD-10-CM | POA: Diagnosis not present

## 2024-11-10 LAB — CBC
HCT: 41.6 % (ref 39.0–52.0)
Hemoglobin: 14.6 g/dL (ref 13.0–17.0)
MCH: 31.8 pg (ref 26.0–34.0)
MCHC: 35.1 g/dL (ref 30.0–36.0)
MCV: 90.6 fL (ref 80.0–100.0)
Platelets: 263 K/uL (ref 150–400)
RBC: 4.59 MIL/uL (ref 4.22–5.81)
RDW: 12.8 % (ref 11.5–15.5)
WBC: 13.6 K/uL — ABNORMAL HIGH (ref 4.0–10.5)
nRBC: 0 % (ref 0.0–0.2)

## 2024-11-10 LAB — BASIC METABOLIC PANEL WITH GFR
Anion gap: 12 (ref 5–15)
BUN: 10 mg/dL (ref 8–23)
CO2: 26 mmol/L (ref 22–32)
Calcium: 9.5 mg/dL (ref 8.9–10.3)
Chloride: 96 mmol/L — ABNORMAL LOW (ref 98–111)
Creatinine, Ser: 1.06 mg/dL (ref 0.61–1.24)
GFR, Estimated: >60 mL/min (ref >60)
Glucose, Bld: 116 mg/dL — ABNORMAL HIGH (ref 70–99)
Potassium: 4.1 mmol/L (ref 3.5–5.1)
Sodium: 134 mmol/L — ABNORMAL LOW (ref 135–145)

## 2024-11-10 MED ORDER — CARBIDOPA-LEVODOPA ER 50-200 MG PO TBCR: 1.0000 | EXTENDED_RELEASE_TABLET | Freq: Every day | ORAL | Status: DC

## 2024-11-10 MED ORDER — OXYCODONE HCL 5 MG PO TABS
5.0000 mg | ORAL_TABLET | Freq: Four times a day (QID) | ORAL | 0 refills | Status: DC | PRN
  Filled 2024-11-10: qty 25, 7d supply, fill #0

## 2024-11-10 MED ORDER — METHOCARBAMOL 750 MG PO TABS
750.0000 mg | ORAL_TABLET | Freq: Three times a day (TID) | ORAL | 0 refills | Status: DC
  Filled 2024-11-10: qty 30, 10d supply, fill #0

## 2024-11-10 NOTE — Evaluation (Signed)
 Occupational Therapy Evaluation Patient Details Name: James Collier MRN: [id number] DOB: [birth year] Today's Date: 11/10/2024   History of Present Illness   68 y/o M presenting s/p laparoscopic repair of incsional hernia on 12/15. PMH includes cataract extraction, GERD, Parkinson's disease     Clinical Impressions Pt reports ind at baseline with ADLs and functional mobility, lives with spouse who can assist at d/c. Pt currently with 4/10 abdominal/incisional pain, needs up to mod A for ADLs (incr assist for LB ADL), and CGA for transfers with RW. Pt educated on compensatory strategies for LB ADL, but reports spouse will be able to assist with this at home. Educated on use of pillow to brace abdomen with transitional movements as well. Pt presenting with impairments listed below, will follow acutely. Recommend HHOT at d/c pending progression.     If plan is discharge home, recommend the following:   A little help with walking and/or transfers;A little help with bathing/dressing/bathroom;Assistance with cooking/housework     Functional Status Assessment   Patient has had a recent decline in their functional status and demonstrates the ability to make significant improvements in function in a reasonable and predictable amount of time.     Equipment Recommendations   Other (comment) (RW)     Recommendations for Other Services   PT consult     Precautions/Restrictions   Restrictions Weight Bearing Restrictions Per Provider Order: No     Mobility Bed Mobility               General bed mobility comments: OOB in chair upon arrival and departure    Transfers Overall transfer level: Needs assistance Equipment used: Rolling walker (2 wheels) Transfers: Sit to/from Stand Sit to Stand: Contact guard assist                  Balance Overall balance assessment: Needs assistance Sitting-balance support: Feet supported Sitting balance-Leahy Scale: Good      Standing balance support: During functional activity, Reliant on assistive device for balance Standing balance-Leahy Scale: Fair Standing balance comment: static standing without AD                           ADL either performed or assessed with clinical judgement   ADL Overall ADL's : Needs assistance/impaired Eating/Feeding: Set up   Grooming: Set up;Standing   Upper Body Bathing: Set up;Sitting;Standing   Lower Body Bathing: Moderate assistance;Sitting/lateral leans;Sit to/from stand   Upper Body Dressing : Set up;Sitting;Standing   Lower Body Dressing: Moderate assistance;Sit to/from stand;Sitting/lateral leans   Toilet Transfer: Contact guard assist;Ambulation;Rolling walker (2 wheels)   Toileting- Clothing Manipulation and Hygiene: Contact guard assist Toileting - Clothing Manipulation Details (indicate cue type and reason): standing urination     Functional mobility during ADLs: Contact guard assist;Rolling walker (2 wheels)       Vision   Vision Assessment?: No apparent visual deficits     Perception Perception: Not tested       Praxis Praxis: Not tested       Pertinent Vitals/Pain Pain Assessment Pain Assessment: Faces Pain Score: 3  Faces Pain Scale: Hurts little more Pain Location: abdomen Pain Descriptors / Indicators: Discomfort Pain Intervention(s): Limited activity within patient's tolerance, Monitored during session, Repositioned     Extremity/Trunk Assessment Upper Extremity Assessment Upper Extremity Assessment: Generalized weakness;Left hand dominant   Lower Extremity Assessment Lower Extremity Assessment: Defer to PT evaluation   Cervical / Trunk Assessment Cervical / Trunk  Assessment: Normal   Communication Communication Communication: No apparent difficulties   Cognition Arousal: Alert Behavior During Therapy: WFL for tasks assessed/performed, Flat affect Cognition: No apparent impairments                                Following commands: Intact       Cueing  General Comments   Cueing Techniques: Verbal cues  VSS on RA   Exercises     Shoulder Instructions      Home Living Family/patient expects to be discharged to:: Private residence Living Arrangements: Spouse/significant other Available Help at Discharge: Friend(s);Available PRN/intermittently Type of Home: House Home Access: Stairs to enter [employer] of Steps: 1 Entrance Stairs-Rails: None Home Layout: One level     Bathroom Shower/Tub: Walk-in shower;Door   Bathroom Toilet: Handicapped height Bathroom Accessibility: No   Home Equipment: Grab bars - tub/shower;Hand held shower head;Shower seat - built in          Prior Functioning/Environment Prior Level of Function : Independent/Modified Independent;Driving             Mobility Comments: ind ADLs Comments: ind, manages own meds    OT Problem List: Decreased strength;Decreased range of motion;Decreased activity tolerance;Impaired balance (sitting and/or standing)   OT Treatment/Interventions: Self-care/ADL training;Therapeutic exercise;Energy conservation;DME and/or AE instruction;Therapeutic activities;Patient/family education;Balance training      OT Goals(Current goals can be found in the care plan section)   Acute Rehab OT Goals Patient Stated Goal: none stated OT Goal Formulation: With patient Time For Goal Achievement: 11/24/24 Potential to Achieve Goals: Good ADL Goals Pt Will Perform Lower Body Dressing: with supervision;sit to/from stand;sitting/lateral leans Pt Will Transfer to Toilet: with supervision;ambulating;regular height toilet Pt Will Perform Tub/Shower Transfer: Shower transfer;with supervision;ambulating;shower seat   OT Frequency:  Min 1X/week    Co-evaluation              AM-PAC OT 6 Clicks Daily Activity     Outcome Measure Help from another person eating meals?: None Help from another person  taking care of personal grooming?: A Little Help from another person toileting, which includes using toliet, bedpan, or urinal?: A Little Help from another person bathing (including washing, rinsing, drying)?: A Lot Help from another person to put on and taking off regular upper body clothing?: A Little Help from another person to put on and taking off regular lower body clothing?: A Lot 6 Click Score: 17   End of Session Equipment Utilized During Treatment: Rolling walker (2 wheels) Nurse Communication: Mobility status  Activity Tolerance: Patient tolerated treatment well Patient left: in chair;with call bell/phone within reach  OT Visit Diagnosis: Unsteadiness on feet (R26.81);Other abnormalities of gait and mobility (R26.89);Muscle weakness (generalized) (M62.81)                Time: 9192-9167 OT Time Calculation (min): 25 min Charges:  OT General Charges $OT Visit: 1 Visit OT Evaluation $OT Eval Moderate Complexity: 1 Mod OT Treatments $Self Care/Home Management : 8-22 mins  Derrel Moore K, OTD, OTR/L SecureChat Preferred Acute Rehab [phone]   Tres Grzywacz K Koonce 11/10/2024, 8:40 AM

## 2024-11-10 NOTE — Evaluation (Signed)
 Physical Therapy Evaluation Patient Details Name: James Collier MRN: [id number] DOB: 1956/05/24 Today's Date: 11/10/2024  History of Present Illness  68 y/o M presenting s/p laparoscopic repair of incsional hernia on 12/15. PMH includes cataract extraction, GERD, Parkinson's disease  Clinical Impression  Pt admitted with above diagnosis. Lives at home with wife, in a single-level home with 1 steps to enter; REorts not difficulty with this step; Prior to admission, pt was able to manage independently, no falls reported; Presents to PT with abdominal soreness postop, decr activity tolerance, decr functional endurance;  Overall needing contact guard with transfers and gait, and needing up to mod assist for bed mobiltiy; Incr time considering how pt typically gets in/out of his bed, and with discussion and demonstration of logroll technique; pt isn't sure that his wife will be able to help his feet into the bed, and seems hesitant to change the way he gets into and out of the bed; he does have a comfortable recliner that he can sleep in, and agrees to sleep in his recliner if getting in and out of bed becomes a hardship; Pt currently with functional limitations due to the deficits listed below (see PT Problem List). Pt will benefit from skilled PT to increase their independence and safety with mobility to allow discharge to the venue listed below.           If plan is discharge home, recommend the following: A little help with walking and/or transfers;A little help with bathing/dressing/bathroom;Assist for transportation   Can travel by private vehicle        Equipment Recommendations Rolling walker (2 wheels)  Recommendations for Other Services       Functional Status Assessment Patient has had a recent decline in their functional status and demonstrates the ability to make significant improvements in function in a reasonable and predictable amount of time.     Precautions / Restrictions  Precautions Precautions: Other (comment) Recall of Precautions/Restrictions: Intact Precaution/Restrictions Comments: Try log roll for comfort with bed mobility Restrictions Weight Bearing Restrictions Per Provider Order: No      Mobility  Bed Mobility Overal bed mobility: Needs Assistance Bed Mobility: Supine to Sit, Sit to Supine     Supine to sit: Contact guard Sit to supine: Contact guard assist   General bed mobility comments: Cues and demonstration for log roll technique; Pt tends to enter his bed going forward onto it, and we discussed pros and cons to that way; he anticipates needing physical assist to get his feet into the bed (and isn't sure that his wife will be awake to help him; he has an adjustable bed, and pt demnstrated how he plans to get in -- it is doable, but looks like it is painful    Transfers Overall transfer level: Needs assistance Equipment used: Rolling walker (2 wheels) Transfers: Sit to/from Stand Sit to Stand: Contact guard assist           General transfer comment: Stood from recliner an dfrom EOB    Ambulation/Gait Ambulation/Gait assistance: Contact guard assist Gait Distance (Feet): 150 Feet Assistive device: Rolling walker (2 wheels) Gait Pattern/deviations: Step-through pattern, Trunk flexed       General Gait Details: Cues for upright posture and RW proximity; Overall good control of RW until last quarter of walk when RW got a little too far ahead of him with fatigue  Acupuncturist  Bed    Modified Rankin (Stroke Patients Only)       Balance Overall balance assessment: Needs assistance Sitting-balance support: Feet supported Sitting balance-Leahy Scale: Good     Standing balance support: During functional activity, Reliant on assistive device for balance Standing balance-Leahy Scale: Fair Standing balance comment: static standing without AD                              Pertinent Vitals/Pain Pain Assessment Pain Assessment: Faces Faces Pain Scale: Hurts even more (at end of session) Pain Location: abdomen Pain Descriptors / Indicators: Discomfort Pain Intervention(s): Monitored during session, Limited activity within patient's tolerance, Patient requesting pain meds-RN notified    Home Living Family/patient expects to be discharged to:: Private residence Living Arrangements: Spouse/significant other Available Help at Discharge: Friend(s);Available PRN/intermittently Type of Home: House Home Access: Stairs to enter Entrance Stairs-Rails: None Entrance Stairs-Number of Steps: 1   Home Layout: One level Home Equipment: Grab bars - tub/shower;Hand held shower head;Shower seat - built in      Prior Function Prior Level of Function : Independent/Modified Independent;Driving             Mobility Comments: ind ADLs Comments: ind, manages own meds     Extremity/Trunk Assessment   Upper Extremity Assessment Upper Extremity Assessment: Defer to OT evaluation    Lower Extremity Assessment Lower Extremity Assessment: Generalized weakness (with history of Parkinson's Disease)    Cervical / Trunk Assessment Cervical / Trunk Assessment: Normal  Communication   Communication Communication: No apparent difficulties    Cognition Arousal: Alert Behavior During Therapy: WFL for tasks assessed/performed, Flat affect                             Following commands: Intact       Cueing Cueing Techniques: Verbal cues     General Comments General comments (skin integrity, edema, etc.): NAD on RA; Provided pt with pillow to splint coughing; Taught incentive spirometry, which pt return demonstrated    Exercises     Assessment/Plan    PT Assessment Patient needs continued PT services  PT Problem List Decreased strength;Decreased range of motion;Decreased activity tolerance;Decreased balance;Decreased mobility;Decreased  coordination;Pain;Decreased knowledge of use of DME;Decreased knowledge of precautions       PT Treatment Interventions DME instruction;Gait training;Stair training;Functional mobility training;Therapeutic activities;Therapeutic exercise;Balance training;Neuromuscular re-education;Patient/family education;Manual techniques    PT Goals (Current goals can be found in the Care Plan section)  Acute Rehab PT Goals Patient Stated Goal: Decr pain with bed mobility PT Goal Formulation: With patient Time For Goal Achievement: 11/24/24 Potential to Achieve Goals: Good    Frequency Min 3X/week     Co-evaluation               AM-PAC PT 6 Clicks Mobility  Outcome Measure Help needed turning from your back to your side while in a flat bed without using bedrails?: None Help needed moving from lying on your back to sitting on the side of a flat bed without using bedrails?: A Little Help needed moving to and from a bed to a chair (including a wheelchair)?: A Little Help needed standing up from a chair using your arms (e.g., wheelchair or bedside chair)?: A Little Help needed to walk in hospital room?: A Little Help needed climbing 3-5 steps with a railing? : A Little 6 Click Score: 19    End  of Session   Activity Tolerance: Patient tolerated treatment well Patient left: in chair;with call bell/phone within reach;with nursing/sitter in room;with family/visitor present Nurse Communication: Mobility status;Patient requests pain meds PT Visit Diagnosis: Unsteadiness on feet (R26.81);Other abnormalities of gait and mobility (R26.89);Pain Pain - part of body:  (Abdominal)    Time: 8846-8749 PT Time Calculation (min) (ACUTE ONLY): 57 min   Charges:   PT Evaluation $PT Eval Moderate Complexity: 1 Mod PT Treatments $Gait Training: 8-22 mins $Therapeutic Exercise: 8-22 mins $Therapeutic Activity: 8-22 mins PT General Charges $$ ACUTE PT VISIT: 1 Visit         Silvano Currier, PT   Acute Rehabilitation Services Office [phone] Secure Chat welcomed   Silvano VEAR Currier 11/10/2024, 2:37 PM

## 2024-11-10 NOTE — Progress Notes (Signed)
 AVS provided and PIV removed. Patient discharged home with wife.

## 2024-11-10 NOTE — TOC Initial Note (Signed)
 Transition of Care (TOC) - Initial/Assessment Note   Spoke to patient and wife at bedside.   They are hoping for discharge home this afternoon.   Discussed PT recommendations for HHPT and rolling walker.   Discussed both. Patient is able to borrow a rolling walker, they do not want NCM to order   They are agreeable to HHPT as long as HHPT will see them before the weekend . NCM explained HH sees people within 24 to 48 hours. They just want to be sure there will not be a delay due to holidays.   Kelly with [facility] accepted referral and he will be seen within 24 to 48 hours of discharge.   NCM entered orders for MD to sign and updated AVS  Patient Details  Name: James Collier MRN: [id number] Date of Birth: Jun 25, 1956  Transition of Care [facility]) CM/SW Contact:    Stephane Powell Jansky, RN Phone Number: 11/10/2024, 1:30 PM  Clinical Narrative:                   Expected Discharge Plan: Home w Home Health Services Barriers to Discharge: Continued Medical Work up   Patient Goals and CMS Choice Patient states their goals for this hospitalization and ongoing recovery are:: to return to home CMS Medicare.gov Compare Post Acute Care list provided to:: Patient Choice offered to / list presented to : Patient      Expected Discharge Plan and Services   Discharge Planning Services: CM Consult Post Acute Care Choice: Home Health Living arrangements for the past 2 months: Single Family Home                 DME Arranged:  (see note)         HH Arranged: PT, OT HH Agency: [facility] Date HH Agency Contacted: 11/10/24 Time HH Agency Contacted: 1329 Representative spoke with at [facility] Agency: [place]  Prior Living Arrangements/Services Living arrangements for the past 2 months: Single Family Home Lives with:: Spouse Patient language and need for interpreter reviewed:: Yes Do you feel safe going back to the place where you live?: Yes      Need for Family Participation in  Patient Care: Yes (Comment) Care giver support system in place?: Yes (comment)   Criminal Activity/Legal Involvement Pertinent to Current Situation/Hospitalization: No - Comment as needed  Activities of Daily Living   ADL Screening (condition at time of admission) Independently performs ADLs?: Yes (appropriate for developmental age) Is the patient deaf or have difficulty hearing?: No Does the patient have difficulty seeing, even when wearing glasses/contacts?: No Does the patient have difficulty concentrating, remembering, or making decisions?: No  Permission Sought/Granted   Permission granted to share information with : Yes, Verbal Permission Granted  Share Information with NAME: wife Devere  Permission granted to share info w AGENCY: [facility]        Emotional Assessment Appearance:: Appears stated age Attitude/Demeanor/Rapport: Engaged Affect (typically observed): Appropriate Orientation: : Oriented to Self, Oriented to Place, Oriented to  Time, Oriented to Situation Alcohol / Substance Use: Not Applicable Psych Involvement: No (comment)  Admission diagnosis:  S/P laparoscopic hernia repair [S01.109, Z87.19] Patient Active Problem List   Diagnosis Date Noted   S/P laparoscopic hernia repair 11/09/2024   Small bowel perforation (HCC) 01/17/2024   Parkinson's disease (HCC) 03/29/2021   History of prostate cancer 12/21/2011   PCP:  Micheal Wolm ORN, MD Pharmacy:   [id number] - [place], [place] - [address]  [place] [address] Phone: [phone] Fax: [phone]     Social Drivers of Health (SDOH) Social History: SDOH Screenings   Food Insecurity: No Food Insecurity (11/09/2024)  Housing: Low Risk (11/09/2024)  Transportation Needs: No Transportation Needs (11/09/2024)  Utilities: Not At Risk (11/09/2024)  Depression (PHQ2-9): Low Risk (09/22/2024)  Social Connections: Moderately Integrated (11/09/2024)  Tobacco Use: Low Risk  (11/09/2024)   SDOH Interventions:     Readmission Risk Interventions     No data to display

## 2024-11-10 NOTE — Anesthesia Postprocedure Evaluation (Signed)
 Anesthesia Post Note  Patient: James Collier  Procedure(s) Performed: REPAIR, HERNIA, INCISIONAL, LAPAROSCOPIC (Abdomen) INSERTION OF MESH (Abdomen)     Patient location during evaluation: PACU Anesthesia Type: General Level of consciousness: awake and alert Pain management: pain level controlled Vital Signs Assessment: post-procedure vital signs reviewed and stable Respiratory status: spontaneous breathing, nonlabored ventilation and respiratory function stable Cardiovascular status: blood pressure returned to baseline and stable Postop Assessment: no apparent nausea or vomiting Anesthetic complications: no   No notable events documented.                Ellery Meroney

## 2024-11-10 NOTE — Discharge Summary (Signed)
 Physician Discharge Summary  Patient ID: James Collier MRN: [id number] DOB/AGE: January 16, 1956 68 y.o.  Admit date: 11/09/2024 Discharge date: 11/10/2024  Admission Diagnoses:incisional hernia  Discharge Diagnoses:  Principal Problem:   S/P laparoscopic hernia repair   Discharged Condition: good  Hospital Course: Patient underwent laparoscopic repair of incisional hernia with mesh.  He had an uncomplicated postoperative course.  He had good pain control.  He was evaluated by PT and OT and home health was recommended.  He tolerated advancement of his diet and is ready to go home on postoperative day 1.  Consults: None  Significant Diagnostic Studies: N/A  Treatments: surgery: As above  Discharge Exam: Blood pressure 127/69, pulse 80, temperature 98.5 F (36.9 C), temperature source Oral, resp. rate 20, height 5' 10 (1.778 m), weight 96.3 kg, SpO2 96%. See progress note  Disposition: Discharge disposition: 01-Home or Self Care       [facility] _______[facility]  UMBILICAL OR INGUINAL HERNIA REPAIR: POST OP INSTRUCTIONS  Always review your discharge instruction sheet given to you by the facility where your surgery was performed. IF YOU HAVE DISABILITY OR FAMILY LEAVE FORMS, YOU MUST BRING THEM TO THE OFFICE FOR PROCESSING.   DO NOT GIVE THEM TO YOUR DOCTOR.  1. A  prescription for pain medication may be given to you upon discharge.  Take your pain medication as prescribed, if needed.  If narcotic pain medicine is not needed, then you may take acetaminophen  (Tylenol ) or ibuprofen  (Advil ) as needed. 2. Take your usually prescribed medications unless otherwise directed. If you need a refill on your pain medication, please contact your pharmacy.  They will contact our office to request authorization. Prescriptions will not be filled after 5 pm or on week-ends. 3. You should follow a light diet the first 24 hours after arrival home, such as soup and crackers, etc.  Be  sure to include lots of fluids daily.  Resume your normal diet the day after surgery. 4.Most patients will experience some swelling and bruising around the umbilicus or in the groin and scrotum.  Ice packs and reclining will help.  Swelling and bruising can take several days to resolve.  6. It is common to experience some constipation if taking pain medication after surgery.  Increasing fluid intake and taking a stool softener (such as Colace) will usually help or prevent this problem from occurring.  A mild laxative (Milk of Magnesia or Miralax) should be taken according to package directions if there are no bowel movements after 48 hours. 7. Unless discharge instructions indicate otherwise, you may remove your bandages 24-48 hours after surgery, and you may shower at that time.  You may have steri-strips (small skin tapes) in place directly over the incision.  These strips should be left on the skin for 7-10 days.  If your surgeon used skin glue on the incision, you may shower in 24 hours.  The glue will flake off over the next 2-3 weeks.  Any sutures or staples will be removed at the office during your follow-up visit. 8. ACTIVITIES:  You may resume regular (light) daily activities beginning the next day--such as daily self-care, walking, climbing stairs--gradually increasing activities as tolerated.  You may have sexual intercourse when it is comfortable.  Refrain from any heavy lifting or straining until approved by your doctor.  a.You may drive when you are no longer taking prescription pain medication, you can comfortably wear a seatbelt, and you can safely maneuver your car and apply brakes.  b.RETURN TO WORK:   _____________________________________________  9.You should see your doctor in the office for a follow-up appointment approximately 2-3 weeks after your surgery.  Make sure that you call for this appointment within a day or two after you arrive home to insure a convenient appointment  time. 10.OTHER INSTRUCTIONS: _________________________    _____________________________________  WHEN TO CALL YOUR DOCTOR: Fever over 101.0 Inability to urinate Nausea and/or vomiting Extreme swelling or bruising Continued bleeding from incision. Increased pain, redness, or drainage from the incision  The clinic staff is available to answer your questions during regular business hours.  Please dont hesitate to call and ask to speak to one of the nurses for clinical concerns.  If you have a medical emergency, go to the nearest emergency room or call 911.  A surgeon from [facility] is always on call at the hospital   [address], [place], [place]  [address] ?  [address] [address], [place], [place]   [address] [phone] ? [phone] ? FAX [phone] Web site: [email or web address]   Allergies as of 11/10/2024       Reactions   Aloe    rash        Medication List     TAKE these medications    carbidopa -levodopa  25-100 MG tablet Commonly known as: SINEMET  IR 2 at 7 AM, 2 at 11 AM, 2 at 3 PM, 1 at 6pm The timing of this medication is very important.   carbidopa -levodopa  50-200 MG tablet Commonly known as: SINEMET  CR TAKE 1 TABLET BY MOUTH ONCE DAILY AT BEDTIME The timing of this medication is very important.   entacapone  200 MG tablet Commonly known as: COMTAN  Take 1 tablet (200 mg total) by mouth 3 (three) times daily. 7am/11am/3pm with carbidopa /levodopa  The timing of this medication is very important.   acetaminophen  500 MG tablet Commonly known as: TYLENOL  Take 500 mg by mouth every 6 (six) hours as needed for mild pain (pain score 1-3) or moderate pain (pain score 4-6).   azelastine  0.1 % nasal spray Commonly known as: ASTELIN  Place 2 sprays into both nostrils 2 (two) times daily. Use in each nostril as directed What changed: when to take this   cetirizine 10 MG tablet Commonly known as: ZYRTEC Take 10 mg by mouth  daily.   docusate sodium  100 MG capsule Commonly known as: COLACE Take 1 capsule (100 mg total) by mouth daily as needed for mild constipation. What changed: when to take this   famotidine  40 MG tablet Commonly known as: PEPCID  Take 1 tablet (40 mg total) by mouth at bedtime.   IBU-200 PO Take 200-400 mg by mouth daily as needed. Advil    levocetirizine 5 MG tablet Commonly known as: XYZAL  Take 1 tablet (5 mg total) by mouth daily as needed for allergies (Can take an etxra dose during flare ups.).   methocarbamol  750 MG tablet Commonly known as: ROBAXIN  Take 1 tablet (750 mg total) by mouth 3 (three) times daily.   Mucinex  600 MG 12 hr tablet Generic drug: guaiFENesin  Take 600 mg by mouth 2 (two) times daily as needed for to loosen phlegm.   NYQUIL PO Take 30 mLs by mouth at bedtime as needed (Sleep).   oxyCODONE  5 MG immediate release tablet Commonly known as: Oxy IR/ROXICODONE  Take 1 tablet (5 mg total) by mouth every 6 (six) hours as needed for severe pain (pain score 7-10).   pantoprazole  40 MG tablet Commonly known as: PROTONIX  Take 1  tablet (40 mg total) by mouth every morning.   PRESERVISION AREDS 2 PO Take 1 tablet by mouth 2 (two) times daily with a meal.   triamcinolone  55 MCG/ACT Aero nasal inhaler Commonly known as: NASACORT  Place 1 spray into the nose every morning.               Discharge Care Instructions  (From admission, onward)           Start     Ordered   11/10/24 0000  Discharge wound care:       Comments: If any drainage, cover with gauze. Abdominal binder for support when UOB.   11/10/24 1443            Contact information for follow-up providers     [facility].   Specialty: Home Health Services Contact information: [address] [place] [address] [place] [address] [phone]              Contact information for after-discharge care     Home Medical Care     [facility]) .   Service: Home Health Services Contact information: [address] [place]  [address] [phone]                     Signed: Dann FORBES Hummer 11/10/2024, 2:49 PM

## 2024-11-10 NOTE — Progress Notes (Signed)
 Physical Therapy Note  PT eval complete with full note to follow;  Recommend Home with HHPT/OT for transition out of hospital;  Also rec RW;  Will follow acutely,   Silvano Currier, PT  Acute Rehabilitation Services Pager [phone] Office [phone]

## 2024-11-10 NOTE — Plan of Care (Signed)

## 2024-11-10 NOTE — Progress Notes (Signed)
 1 Day Post-Op    Subjective: Up in chair, sore, tolerated PO, urinated without difficulty ROS negative except as listed above. Objective: Vital signs in last 24 hours: Temp:  [96.5 F (35.8 C)-99 F (37.2 C)] 98.2 F (36.8 C) (12/15 2043) Pulse Rate:  [75-97] 95 (12/15 2043) Resp:  [14-27] 17 (12/15 2043) BP: (129-166)/(70-92) 166/89 (12/15 2043) SpO2:  [92 %-98 %] 94 % (12/15 2043) Weight:  [96.3 kg] 96.3 kg (12/15 1135) Last BM Date : 11/08/24  Intake/Output from previous day: 12/15 0701 - 12/16 0700 In: 1844.3 [P.O.:240; I.V.:1504.3; IV Piggyback:100] Out: 1070 [Urine:1050; Blood:20] Intake/Output this shift: No intake/output data recorded.  General appearance: alert and cooperative GI: soft, incisions OK, expected soreness  Lab Results: CBC  Recent Labs    11/10/24 0540  WBC 13.6*  HGB 14.6  HCT 41.6  PLT 263   BMET Recent Labs    11/10/24 0540  NA 134*  K 4.1  CL 96*  CO2 26  GLUCOSE 116*  BUN 10  CREATININE 1.06  CALCIUM 9.5   PT/INR No results for input(s): LABPROT, INR in the last 72 hours. ABG No results for input(s): PHART, HCO3 in the last 72 hours.  Invalid input(s): PCO2, PO2  Studies/Results: No results found.  Anti-infectives: Anti-infectives (From admission, onward)    Start     Dose/Rate Route Frequency Ordered Stop   11/09/24 1200  ceFAZolin  (ANCEF ) IVPB 2g/100 mL premix        2 g 200 mL/hr over 30 Minutes Intravenous On call to O.R. 11/09/24 1112 11/09/24 1333       Assessment/Plan: S/P laparoscopic repair of incisional hernia with mesh 12/15 - diet, D/C IVF, PT/OT  Parkinson's - home meds GERD - home meds VTE - LMWH Dispo - check this PM after therapies for D/C    LOS: 0 days    Dann Hummer, MD, MPH, FACS Trauma & General Surgery Use [email or web address] to contact on call provider  12/16/2025Patient ID: James Collier, male   DOB: 01-01-1956, 68 y.o.   MRN: [id number]

## 2024-11-13 ENCOUNTER — Ambulatory Visit: Payer: Self-pay

## 2024-11-13 NOTE — Telephone Encounter (Signed)
 Spoke with the patient and advised he seek treatment at an urgent care for evaluation as we do not have openings here and any provider can treat him for symptoms below.  Offered to inform him of locations close to his home and the patient declined.

## 2024-11-13 NOTE — Telephone Encounter (Signed)
 FYI Only or Action Required?: Action required by provider: update on patient condition.wants recommendations for worsening nasal drainage. Calling surgeon as well for constipation  Patient was last seen in primary care on 09/22/2024 by Micheal Wolm ORN, MD.  Called Nurse Triage reporting URI and Constipation.  Symptoms began several days ago.  Interventions attempted: Prescription medications: zyzal, miralax.  Symptoms are: gradually worsening.  Triage Disposition: See PCP When Office is Open (Within 3 Days)  Patient/caregiver understands and will follow disposition?: yes   Copied from CRM #[id number]. Topic: Clinical - Red Word Triage >> Nov 13, 2024  8:40 AM Rea ORN wrote: Red Word that prompted transfer to Nurse Triage: post nasal drainage, thick mucus and having difficulty spiting it out. Pt has parkinson's and recently had hernia surgery. Reason for Disposition  [1] Nasal discharge AND [2] present > 10 days  Unable to have a bowel movement (BM) without laxative or enema  Answer Assessment - Initial Assessment Questions 1. ONSET: When did the nasal discharge start?      Chronic worsening since monday 2. AMOUNT: How much discharge is there?      Large amt 3. COUGH: Do you have a cough? If Yes, ask: Describe the color of your mucus. (e.g., clear, white, yellow, green)     No cough 4. RESPIRATORY DISTRESS: Describe your breathing.      no 5. FEVER: Do you have a fever? If Yes, ask: What is your temperature, how was it measured, and when did it start?     no 6. SEVERITY: Overall, how bad are you feeling right now? (e.g., doesn't interfere with normal activities, staying home from school/work, staying in bed)      Not feeling well, post op 7. OTHER SYMPTOMS: Do you have any other symptoms? (e.g., earache, mouth sores, sore throat, wheezing)     no  Answer Assessment - Initial Assessment Questions 1. STOOL PATTERN OR FREQUENCY: How often do you have a bowel  movement (BM)?  (Normal range: 3 times a day to every 3 days)  When was your last BM?        2. STRAINING: Do you have to strain to have a BM?       3. ONSET: When did the constipation begin?     Post op 12/15 on narcotics 4. RECTAL PAIN: Does your rectum hurt when the stool comes out? If Yes, ask: Do you have hemorrhoids? How bad is the pain?  (Scale 1-10; or mild, moderate, severe)     No bowel movement  5. BM COMPOSITION: Are the stools hard?      no 6. BLOOD ON STOOLS: Has there been any blood on the toilet tissue or on the surface of the BM? If Yes, ask: When was the last time?      7. CHRONIC CONSTIPATION: Is this a new problem for you?  If No, ask: How long have you had this problem? (days, weeks, months)       8. CHANGES IN DIET OR HYDRATION: Have there been any recent changes in your diet? How much fluids are you drinking on a daily basis?  How much have you had to drink today?      9. MEDICINES: Have you been taking any new medicines? Are you taking any narcotic pain medicines? (e.g., Dilaudid , morphine , Percocet, Vicodin)     ocycontin 10. LAXATIVES: Have you been using any stool softeners, laxatives, or enemas?  If Yes, ask What are you using, how often, and  when was the last time?       mirilax 11. ACTIVITY:  How much walking do you do every day?  Has your activity level decreased in the past week?        Very little 12. CAUSE: What do you think is causing the constipation?        medication 13. MEDICAL HISTORY: Do you have a history of hemorrhoids, rectal fissures, rectal surgery, or rectal abscess?          14. OTHER SYMPTOMS: Do you have any other symptoms? (e.g., abdomen pain, bloating, fever, vomiting)  Protocols used: Common Cold-A-AH, Constipation-A-AH

## 2024-11-24 ENCOUNTER — Encounter: Payer: Self-pay | Admitting: Allergy and Immunology

## 2024-11-24 ENCOUNTER — Ambulatory Visit: Admitting: Allergy and Immunology

## 2024-11-24 ENCOUNTER — Other Ambulatory Visit: Payer: Self-pay

## 2024-11-24 VITALS — BP 118/74 | HR 75 | Temp 98.3°F | Resp 16 | Ht 70.0 in | Wt 216.2 lb

## 2024-11-24 DIAGNOSIS — K219 Gastro-esophageal reflux disease without esophagitis: Secondary | ICD-10-CM

## 2024-11-24 DIAGNOSIS — G20B2 Parkinson's disease with dyskinesia, with fluctuations: Secondary | ICD-10-CM | POA: Diagnosis not present

## 2024-11-24 DIAGNOSIS — J3089 Other allergic rhinitis: Secondary | ICD-10-CM | POA: Diagnosis not present

## 2024-11-24 MED ORDER — PANTOPRAZOLE SODIUM 40 MG PO TBEC: 40.0000 mg | DELAYED_RELEASE_TABLET | Freq: Two times a day (BID) | ORAL | 1 refills | Status: AC

## 2024-11-24 MED ORDER — FAMOTIDINE 40 MG PO TABS: 40.0000 mg | ORAL_TABLET | Freq: Every evening | ORAL | 1 refills | Status: AC

## 2024-11-24 NOTE — Progress Notes (Unsigned)
 "  [place] [facility] [place] [facility] [place] [facility] [place] [facility] [place]   Follow-up Note  Referring Provider: Micheal Wolm ORN, MD Primary Provider: Micheal Wolm ORN, MD Date of Office Visit: 11/24/2024  Subjective:   James Collier (DOB: Feb 22, 1956) is a 68 y.o. male who [facility] in re-evaluation of the following:  HPI: Doniven returns to this clinic in evaluation of LPR and rhinitis.  I last saw him in this clinic 18 August 2024.  Although he has had improvement regarding his chronic postnasal drip and his throat clearing with therapy directed against reflux and some behavioral modification he still feels as though he has drainage in his throat that he cannot clear out.  Sometimes he has to sleep in a recumbent position because he has fear that his throat is going to clog up.  His swallowing is okay although he has been having some problems swallowing pills.  He was intubated 2 weeks ago for repair of a abdominal hernia but he does not think he is any worse as a result of that procedure.  He has had very little issues with his upper airway.  Allergies as of 11/24/2024       Reactions   Aloe    rash        Medication List    carbidopa -levodopa  25-100 MG tablet Commonly known as: SINEMET  IR 2 at 7 AM, 2 at 11 AM, 2 at 3 PM, 1 at 6pm The timing of this medication is very important.   carbidopa -levodopa  50-200 MG tablet Commonly known as: SINEMET  CR TAKE 1 TABLET BY MOUTH ONCE DAILY AT BEDTIME The timing of this medication is very important.   entacapone  200 MG tablet Commonly known as: COMTAN  Take 1 tablet (200 mg total) by mouth 3 (three) times daily. 7am/11am/3pm with carbidopa /levodopa  The timing of this medication is very important.   acetaminophen  500 MG tablet Commonly known as: TYLENOL  Take 500 mg by mouth every 6 (six) hours as needed for mild pain (pain score 1-3) or moderate pain (pain score 4-6).   azelastine  0.1 %  nasal spray Commonly known as: ASTELIN  Place 2 sprays into both nostrils 2 (two) times daily. Use in each nostril as directed What changed: when to take this   cetirizine 10 MG tablet Commonly known as: ZYRTEC Take 10 mg by mouth daily.   docusate sodium  100 MG capsule Commonly known as: COLACE Take 1 capsule (100 mg total) by mouth daily as needed for mild constipation. What changed: when to take this   famotidine  40 MG tablet Commonly known as: PEPCID  Take 1 tablet (40 mg total) by mouth at bedtime.   IBU-200 PO Take 200-400 mg by mouth daily as needed. Advil    levocetirizine 5 MG tablet Commonly known as: XYZAL  Take 1 tablet (5 mg total) by mouth daily as needed for allergies (Can take an etxra dose during flare ups.).   methocarbamol  750 MG tablet Commonly known as: ROBAXIN  Take 1 tablet (750 mg total) by mouth 3 (three) times daily.   Mucinex  600 MG 12 hr tablet Generic drug: guaiFENesin  Take 600 mg by mouth 2 (two) times daily as needed for to loosen phlegm.   NYQUIL PO Take 30 mLs by mouth at bedtime as needed (Sleep).   oxyCODONE  5 MG immediate release tablet Commonly known as: Oxy IR/ROXICODONE  Take 1 tablet (5 mg total) by mouth every 6 (six) hours as needed for severe pain (pain score 7-10).  pantoprazole  40 MG tablet Commonly known as: PROTONIX  Take 1 tablet (40 mg total) by mouth 1 time daily.   PRESERVISION AREDS 2 PO Take 1 tablet by mouth 2 (two) times daily with a meal.   triamcinolone  55 MCG/ACT Aero nasal inhaler Commonly known as: NASACORT  Place 1 spray into the nose every morning.    Past Medical History:  Diagnosis Date   Allergy     Blood in stool    Cancer (HCC) 11/26/2010   prostate   Eczema    GERD (gastroesophageal reflux disease)    Parkinson disease (HCC)     Past Surgical History:  Procedure Laterality Date   CATARACT EXTRACTION Bilateral    HEMORROIDECTOMY  1986   INCISIONAL HERNIA REPAIR N/A 11/09/2024   Procedure:  REPAIR, HERNIA, INCISIONAL, LAPAROSCOPIC;  Surgeon: Sebastian Moles, MD;  Location: [facility] OR;  Service: General;  Laterality: N/A;   INSERTION OF MESH N/A 11/09/2024   Procedure: INSERTION OF MESH;  Surgeon: Sebastian Moles, MD;  Location: [facility] OR;  Service: General;  Laterality: N/A;   LAPAROSCOPY N/A 01/18/2024   Procedure: ATTEMPTED LAPAROSCOPY CONVERTED TO OPEN DIAGNOSTIC LAPAROTOMY WITH BOWEL RESCTION;  Surgeon: Sebastian Moles, MD;  Location: [facility] OR;  Service: General;  Laterality: N/A;   PROSTATE SURGERY  2011   TONSILLECTOMY  1965    Review of systems negative except as noted in HPI / PMHx or noted below:  Review of Systems  Constitutional: Negative.   HENT: Negative.    Eyes: Negative.   Respiratory: Negative.    Cardiovascular: Negative.   Gastrointestinal: Negative.   Genitourinary: Negative.   Musculoskeletal: Negative.   Skin: Negative.   Neurological: Negative.   Endo/Heme/Allergies: Negative.   Psychiatric/Behavioral: Negative.       Objective:   Vitals:   11/24/24 1353  BP: 118/74  Pulse: 75  Resp: 16  Temp: 98.3 F (36.8 C)  SpO2: 96%   Height: 5' 10 (177.8 cm)  Weight: 216 lb 3.2 oz (98.1 kg)   Physical Exam Constitutional:      Appearance: He is not diaphoretic.  HENT:     Head: Normocephalic.     Right Ear: Tympanic membrane, ear canal and external ear normal.     Left Ear: Tympanic membrane, ear canal and external ear normal.     Nose: Nose normal. No mucosal edema or rhinorrhea.     Mouth/Throat:     Pharynx: Uvula midline. No oropharyngeal exudate.  Eyes:     Conjunctiva/sclera: Conjunctivae normal.  Neck:     Thyroid : No thyromegaly.     Trachea: Trachea normal. No tracheal tenderness or tracheal deviation.  Cardiovascular:     Rate and Rhythm: Normal rate and regular rhythm.     Heart sounds: Normal heart sounds, S1 normal and S2 normal. No murmur heard. Pulmonary:     Effort: No respiratory distress.     Breath sounds: Normal breath  sounds. No stridor. No wheezing or rales.  Lymphadenopathy:     Head:     Right side of head: No tonsillar adenopathy.     Left side of head: No tonsillar adenopathy.     Cervical: No cervical adenopathy.  Skin:    Findings: No erythema or rash.     Nails: There is no clubbing.  Neurological:     Mental Status: He is alert.     Diagnostics:   Results of a rhinoscopy performed 12 October 2024 identifies the following:  The nasal cavity and nasopharynx did not reveal  any masses or lesions, mucosa appeared to be without obvious lesions. The tongue base, pharyngeal walls, piriform sinuses, vallecula, epiglottis and postcricoid region are normal in appearance EXCEPT: mid fold nodule has since resolved. The visualized portion of the subglottis and proximal trachea is widely patent. The vocal folds are mobile bilaterally.   Results of a modified barium swallow obtained 07 Apr 2021 identifies the following:  Pharyngeal swallow marked by decreased epiglottic deflection and decreased laryngeal closure allowing vallecular retention and trace laryngeal penetration of liquids.  Retention is more noticeable with solids than liquids - pt senses retention and uses liquids to clear solids from pharynx.    Pharyngeal retention varied from 25% with first bolus of pudding to approx. 40% with 2nd cracker bolus. Pt also appeared with mild secretion retention in pharynx  - as barium appeared to adhere to secretions.    Chin tuck posture appeared to facilitate pharyngeal clearance with puree - but was not consistently effective.  Cued effort swallow did not improve pharyngeal clearance with solids. Piecemealing which is [facility].    Pt did not aspirate with any boluses tested including thin, nectar, pudding, cracker and barium tablet with thin. Barium tablet momentarily stalled under epiglottis but readily cleared with 2nd sequential swallow of thin.    Assessment and Plan:   1. LPRD (laryngopharyngeal reflux  disease)   2. Perennial allergic rhinitis   3. Parkinson's disease with dyskinesia and fluctuating manifestations (HCC)    1.  Continue to treat reflux / LPR:   A. INCREASE Pantoprazole  40 mg - 2 times per day  B. Famotidine  40 mg - 1 tab in PM  C. Consolidate caffeine consumption  D. Replace throat clearing with swallowing/drinking maneuver  2.  Continue to treat inflammation of airway:   A. OTC Nasacort  - 1 spray each nostril 1 time per day  3. If needed:   A. OTC antihistamine  B. Jolly Ranchers   4.  Obtain modified barium swallow  5. Influenza = Tamiflu. Covid = Paxlovid  6. Return to clinic in 12 weeks or earlier if problem.   Dontrel still suffers from issues with his throat.  Will increase his treatment for LPR.  Will obtain a modified barium swallow in further investigation of whether or not his Parkinson's has progressed to affect his laryngeal function.  I will contact him with the results of that study once it is available for review.  [name], MD Allergy  / Immunology [facility] "

## 2024-11-24 NOTE — Patient Instructions (Addendum)
" °  1.  Continue to treat reflux / LPR:   A. INCREASE Pantoprazole  40 mg - 2 times per day  B. Famotidine  40 mg - 1 tab in PM  C. Consolidate caffeine consumption  D. Replace throat clearing with swallowing/drinking maneuver  2.  Continue to treat inflammation of airway:   A. OTC Nasacort  - 1 spray each nostril 1 time per day  3. If needed:   A. OTC antihistamine  B. Jolly Ranchers   4.  Obtain modified barium swallow  5. Influenza = Tamiflu. Covid = Paxlovid  6. Return to clinic in 12 weeks or earlier if problem.  "

## 2024-11-24 NOTE — Progress Notes (Signed)
 "   Assessment/Plan:   1.  Parkinsons Disease  -move carbidopa /levodopa  25/100, 2 tablets at 7 AM, 2 tablets at 11 AM, 2 tablet at 3 PM and then he will decide if he needs another at 6-7pm  -continue carbidopa /levodopa  50/200 CR at bed for cramping.  This has helped.  -stop entacapone   -start ongentys  50 mg q hs  -consider crexont  in the future  -discussed Parkinsons Disease GeneRation study  -We discussed that it used to be thought that levodopa  would increase risk of melanoma but now it is believed that Parkinsons itself likely increases risk of melanoma. he is to get regular skin checks.  He is seeing Dr. Rolan Molt  -increase exercise  2.  Dysphagia  -MBE in May, 2022 with evidence of mild oropharyngeal dysphagia.  Regular solid as well as mechanical soft solids with thin liquids were recommended.  -c/o a thick mucous in the throat and pcp has tried a lot of medication for it and has allergy  appt upcoming.  I suspect due to Parkinsons Disease but often this is a c/o in these patients but there is not good tx.    -has 1/28 appt for repeat MBE, ordered by allergy   3.  Lightheadedness  -improved  -BP good  4.  Urinary frequency  -has urologist  5.  Fatigue  -we discussed that fatigue is the number one treatment resistant complaint of Parkinsons Disease patients.  No matter what we do with the Parkinsons Disease medications (including d/c them), patients tend to c/o fatigue, unfortunately.    -Primary care has recommended sleep study, and I agree with that, but patient declined.   6.  Constipation  -discussed nature and pathophysiology and association with PD  -discussed importance of hydration.  Pt is to increase water intake  -pt is given a copy of the rancho recipe  -recommended daily colace  -recommended miralax prn 7.  History of bowel perforation  - Status post small bowel resection with Dr. Sebastian Subjective:   James Collier was seen today in follow up for Parkinsons  disease, diagnosed last visit.  My previous records were reviewed prior to todays visit as well as outside records available to me.  This patient is accompanied in the office by his spouse who supplements the history.  I did move the timing of his levodopa  around last visit.  He reports that he didn't notice a huge difference with this but does note wearing off of medication 45 min prior to dosing, esp the 3pm dose.  He also notes that entacapone  is hard to swallow.  He has had no falls.  He generally has no near syncope.  Today, he isn't feeling well.  He notes that he is frustrated with mucous production in the throat and esophagus.  he is following with allergy  and nothing is helping it, despite several tx's.  He has a MBE scheduled soon.  He was in the hospital mid December for laparoscopic hernia repair and those notes are reviewed.   Current prescribed movement disorder medications: Carbidopa /levodopa  25/100,   2 tablets at 7 AM, 2 tablets at 11 AM, 2 tablet at 3 PM and then he will decide if he needs another at 6-7pm Carbidopa /levodopa  50/200 CR at bedtime  Entacapone  200 mg, 1 3 times per day with each dose of levodopa    PREVIOUS MEDICATIONS: pramipexole  (eds)  ALLERGIES:   Allergies  Allergen Reactions   Aloe     rash    CURRENT MEDICATIONS:  Outpatient  Encounter Medications as of 12/02/2024  Medication Sig   acetaminophen  (TYLENOL ) 500 MG tablet Take 500 mg by mouth every 6 (six) hours as needed for mild pain (pain score 1-3) or moderate pain (pain score 4-6).   azelastine  (ASTELIN ) 0.1 % nasal spray Place 2 sprays into both nostrils 2 (two) times daily. Use in each nostril as directed (Patient taking differently: Place 2 sprays into both nostrils at bedtime. Use in each nostril as directed)   carbidopa -levodopa  (SINEMET  CR) 50-200 MG tablet TAKE 1 TABLET BY MOUTH ONCE DAILY AT BEDTIME   carbidopa -levodopa  (SINEMET  IR) 25-100 MG tablet 2 at 7 AM, 2 at 11 AM, 2 at 3 PM, 1 at 6pm    cetirizine (ZYRTEC) 10 MG tablet Take 10 mg by mouth daily.   docusate sodium  (COLACE) 100 MG capsule Take 1 capsule (100 mg total) by mouth daily as needed for mild constipation. (Patient taking differently: Take 100 mg by mouth daily.)   entacapone  (COMTAN ) 200 MG tablet Take 1 tablet (200 mg total) by mouth 3 (three) times daily. 7am/11am/3pm with carbidopa /levodopa    famotidine  (PEPCID ) 40 MG tablet Take 1 tablet (40 mg total) by mouth at bedtime.   guaiFENesin  (MUCINEX ) 600 MG 12 hr tablet Take 600 mg by mouth 2 (two) times daily as needed for to loosen phlegm.   Ibuprofen  (IBU-200 PO) Take 200-400 mg by mouth daily as needed. Advil    levocetirizine (XYZAL ) 5 MG tablet Take 1 tablet (5 mg total) by mouth daily as needed for allergies (Can take an etxra dose during flare ups.).   methocarbamol  (ROBAXIN ) 750 MG tablet Take 1 tablet (750 mg total) by mouth 3 (three) times daily.   Multiple Vitamins-Minerals (PRESERVISION AREDS 2 PO) Take 1 tablet by mouth 2 (two) times daily with a meal.   oxyCODONE  (OXY IR/ROXICODONE ) 5 MG immediate release tablet Take 1 tablet (5 mg total) by mouth every 6 (six) hours as needed for severe pain (pain score 7-10).   pantoprazole  (PROTONIX ) 40 MG tablet Take 1 tablet (40 mg total) by mouth 2 (two) times daily.   Pseudoeph-Doxylamine-DM-APAP (NYQUIL PO) Take 30 mLs by mouth at bedtime as needed (Sleep).   triamcinolone  (NASACORT ) 55 MCG/ACT AERO nasal inhaler Place 1 spray into the nose every morning. (Patient not taking: Reported on 11/24/2024)   No facility-administered encounter medications on file as of 12/02/2024.    Objective:   PHYSICAL EXAMINATION:    VITALS:   There were no vitals filed for this visit.   GEN:  The patient appears stated age and is in NAD. HEENT:  Normocephalic, atraumatic.  The mucous membranes are moist. The superficial temporal arteries are without ropiness or tenderness. CV:  RRR Lungs:  CTAB Neck:  no bruit   Neurological  examination:  Orientation: The patient is alert and oriented x3. Cranial nerves: There is good facial symmetry with facial hypomimia. The speech is fluent and clear. Soft palate rises symmetrically and there is no tongue deviation. Hearing is intact to conversational tone. Sensation: Sensation is intact to light touch throughout Motor: Strength is at least antigravity x4.  Movement examination: Tone: There is mild increased tone in the bilateral UE (same as previous) Abnormal movements: mild axial dyskinesia (stable from previous) Coordination:  There is mild decremation with finger taps bilaterally Gait and Station: The patient has no difficulty arising out of a deep-seated chair without the use of the hands. He has decreased arm swing bilaterally.  He is not short stepped  I have  reviewed and interpreted the following labs independently    Chemistry      Component Value Date/Time   NA 134 (L) 11/10/2024 0540   K 4.1 11/10/2024 0540   CL 96 (L) 11/10/2024 0540   CO2 26 11/10/2024 0540   BUN 10 11/10/2024 0540   CREATININE 1.06 11/10/2024 0540      Component Value Date/Time   CALCIUM 9.5 11/10/2024 0540   ALKPHOS 49 01/17/2024 0918   AST 19 01/17/2024 0918   ALT 11 01/17/2024 0918   BILITOT 0.9 01/17/2024 0918       Lab Results  Component Value Date   WBC 13.6 (H) 11/10/2024   HGB 14.6 11/10/2024   HCT 41.6 11/10/2024   MCV 90.6 11/10/2024   PLT 263 11/10/2024    Lab Results  Component Value Date   TSH 1.66 07/30/2023     Total time spent on today's visit was 47 minutes, including both face-to-face time and nonface-to-face time.  Time included that spent on review of records (prior notes available to me/labs/imaging if pertinent), discussing treatment and goals, answering patient's questions and coordinating care.  Cc:  Micheal Wolm ORN, MD "

## 2024-11-25 ENCOUNTER — Encounter: Payer: Self-pay | Admitting: Allergy and Immunology

## 2024-11-25 ENCOUNTER — Other Ambulatory Visit (HOSPITAL_COMMUNITY): Payer: Self-pay | Admitting: Allergy and Immunology

## 2024-11-25 DIAGNOSIS — R1310 Dysphagia, unspecified: Secondary | ICD-10-CM

## 2024-11-25 DIAGNOSIS — R059 Cough, unspecified: Secondary | ICD-10-CM

## 2024-12-02 ENCOUNTER — Encounter: Payer: Self-pay | Admitting: Neurology

## 2024-12-02 ENCOUNTER — Telehealth: Payer: Self-pay | Admitting: Pharmacy Technician

## 2024-12-02 ENCOUNTER — Ambulatory Visit: Admitting: Neurology

## 2024-12-02 ENCOUNTER — Other Ambulatory Visit (HOSPITAL_COMMUNITY): Payer: Self-pay

## 2024-12-02 VITALS — BP 132/76 | HR 71 | Wt 215.0 lb

## 2024-12-02 DIAGNOSIS — G20B2 Parkinson's disease with dyskinesia, with fluctuations: Secondary | ICD-10-CM | POA: Diagnosis not present

## 2024-12-02 MED ORDER — OPICAPONE 50 MG PO CAPS: 1.0000 | ORAL_CAPSULE | Freq: Every day | ORAL | 1 refills | Status: DC

## 2024-12-02 NOTE — Patient Instructions (Signed)
 STOP entacapone  START opicapone  (ongentys ) 50 mg nightly.

## 2024-12-02 NOTE — Telephone Encounter (Signed)
 Pharmacy Patient Advocate Encounter   Received notification from Physician's Office that prior authorization for ONGENTYS  50MG  is required/requested.   Insurance verification completed.   The patient is insured through [employer].   Per test claim: PA required; PA submitted to above mentioned insurance via Latent Key/confirmation #/[id number] Status is pending

## 2024-12-07 ENCOUNTER — Telehealth: Payer: Self-pay | Admitting: *Deleted

## 2024-12-07 NOTE — Telephone Encounter (Signed)
 Copied from CRM #[id number]. Topic: General - Other >> Dec 07, 2024  2:27 PM Alfonso HERO wrote: Reason for CRM: patient asking for a letter for his wife/caregiver to be excused from jury duty.

## 2024-12-08 ENCOUNTER — Ambulatory Visit: Admitting: Family Medicine

## 2024-12-08 ENCOUNTER — Encounter: Payer: Self-pay | Admitting: Family Medicine

## 2024-12-08 VITALS — BP 116/60 | HR 76 | Temp 98.0°F | Wt 217.6 lb

## 2024-12-08 DIAGNOSIS — K119 Disease of salivary gland, unspecified: Secondary | ICD-10-CM

## 2024-12-08 DIAGNOSIS — G20B2 Parkinson's disease with dyskinesia, with fluctuations: Secondary | ICD-10-CM

## 2024-12-08 DIAGNOSIS — K219 Gastro-esophageal reflux disease without esophagitis: Secondary | ICD-10-CM

## 2024-12-08 NOTE — Progress Notes (Unsigned)
 "  Established Patient Office Visit  Subjective   Patient ID: James Collier, male    DOB: 1956/08/19  Age: 69 y.o. MRN: [id number]  Chief Complaint  Patient presents with   Medical Management of Chronic Issues    HPI  {History (Optional):23778}  Boy is seen today accompanied by wife.  He has history of prostate cancer, Parkinson's disease, history of small bowel perforation, recent umbilical hernia repair.  He has been dealing for quite some time with problems with early morning dry mouth and increased salivation at night.  This interferes with his ability to sleep frequently.  Suspected laryngal pharyngeal reflux.  He is followed by allergist and has also seen ENT in addition to his neurologist.  Currently on aggressive antireflux regimen of Protonix  40 mg twice daily and famotidine  at night.  He has pending modified barium swallow which has been ordered.  Sometimes has difficulty swallowing.  Sometimes has associated cough at night.  No recent fever.  He has also suspected some component of postnasal drip and has been on multiple allergy  medications including oral antihistamines, Nasacort , previous use of Astelin  nasal.  His Parkinson's is treated with Sinemet . He had nasolaryngoscopy in November 2025 with no concerning lesions.  Past Medical History:  Diagnosis Date   Allergy     Blood in stool    Cancer (HCC) 11/26/2010   prostate   Eczema    GERD (gastroesophageal reflux disease)    Parkinson disease (HCC)    Past Surgical History:  Procedure Laterality Date   CATARACT EXTRACTION Bilateral    HEMORROIDECTOMY  1986   INCISIONAL HERNIA REPAIR N/A 11/09/2024   Procedure: REPAIR, HERNIA, INCISIONAL, LAPAROSCOPIC;  Surgeon: Sebastian Moles, MD;  Location[facility] OR;  Service: General;  Laterality: N/A;   INSERTION OF MESH N/A 11/09/2024   Procedure: INSERTION OF MESH;  Surgeon: Sebastian Moles, MD;  Location: [facility] OR;  Service: General;  Laterality: N/A;   LAPAROSCOPY N/A 01/18/2024    Procedure: ATTEMPTED LAPAROSCOPY CONVERTED TO OPEN DIAGNOSTIC LAPAROTOMY WITH BOWEL RESCTION;  Surgeon: Sebastian Moles, MD;  Location: [facility] OR;  Service: General;  Laterality: N/A;   PROSTATE SURGERY  2011   TONSILLECTOMY  1965    reports that he has never smoked. He has never used smokeless tobacco. He reports current alcohol use of about 3.0 standard drinks of alcohol per week. He reports that he does not use drugs. family history includes Arthritis in his father; Cancer in his maternal aunt, maternal grandmother, mother, and paternal grandmother; Ovarian cancer in his paternal grandmother; Parkinson's disease in his father. Allergies[1]  Review of Systems  Constitutional:  Negative for chills, fever and weight loss.  HENT:  Negative for sinus pain and sore throat.   Cardiovascular:  Negative for chest pain.      Objective:     BP 116/60   Pulse 76   Temp 98 F (36.7 C) (Oral)   Wt 217 lb 9.6 oz (98.7 kg)   SpO2 95%   BMI 31.22 kg/m  BP Readings from Last 3 Encounters:  12/08/24 116/60  12/02/24 132/76  11/24/24 118/74   Wt Readings from Last 3 Encounters:  12/08/24 217 lb 9.6 oz (98.7 kg)  12/02/24 215 lb (97.5 kg)  11/24/24 216 lb 3.2 oz (98.1 kg)      Physical Exam Vitals reviewed.  Constitutional:      General: He is not in acute distress.    Appearance: He is not ill-appearing.  Cardiovascular:     Rate  and Rhythm: Normal rate and regular rhythm.  Pulmonary:     Effort: Pulmonary effort is normal.     Breath sounds: Normal breath sounds. No wheezing or rales.  Abdominal:     Palpations: Abdomen is soft.     Comments: He has punctate wounds from recent laparoscopic hernia repair which are healing well.  No signs of secondary infection.  Neurological:     Mental Status: He is alert.      No results found for any visits on 12/08/24.  {Labs (Optional):23779}  The 10-year ASCVD risk score (Arnett DK, et al., 2019) is: 16.2%    Assessment & Plan:    #1 persistent issues of alternating dry mouth and increased salivation.  He has increased salivation especially seems to be problematic at night.  He has been on multiple medications for allergic postnasal drip without improvement.  Has been on aggressive regimen for laryngal pharyngeal reflux with Protonix  and famotidine  without any obvious improvement.  Obviously not a candidate for further anticholinergic therapy for his salivation with his Parkinson's and chronic Sinemet  and risk for orthostasis.  Wolm Scarlet, MD     [1]  Allergies Allergen Reactions   Aloe     rash   "

## 2024-12-20 ENCOUNTER — Other Ambulatory Visit: Payer: Self-pay | Admitting: Neurology

## 2024-12-23 ENCOUNTER — Other Ambulatory Visit: Payer: Self-pay

## 2024-12-23 ENCOUNTER — Telehealth: Payer: Self-pay | Admitting: Neurology

## 2024-12-23 ENCOUNTER — Encounter (HOSPITAL_COMMUNITY)

## 2024-12-23 DIAGNOSIS — G20B2 Parkinson's disease with dyskinesia, with fluctuations: Secondary | ICD-10-CM

## 2024-12-23 MED ORDER — OPICAPONE 50 MG PO CAPS: 1.0000 | ORAL_CAPSULE | Freq: Every day | ORAL | 1 refills | Status: AC

## 2024-12-23 NOTE — Telephone Encounter (Signed)
 Pt rqst update on Rx script for  Entacapone  200 MG

## 2024-12-24 NOTE — Telephone Encounter (Signed)
 Opicapone  was approved and patient is ready to receive

## 2024-12-25 ENCOUNTER — Telehealth: Payer: Self-pay | Admitting: Neurology

## 2024-12-25 NOTE — Telephone Encounter (Signed)
 Pt called in this morning and  he is calling to see what is the status on the prescription for : Opicapone  . Pt wants to know did his insurance approve it. Thanks

## 2024-12-25 NOTE — Telephone Encounter (Signed)
 RX was approved and RX has been sent in to pharmacy. Called patient and let him know of status

## 2024-12-30 ENCOUNTER — Encounter: Payer: Self-pay | Admitting: Allergy and Immunology

## 2025-01-07 ENCOUNTER — Ambulatory Visit: Admitting: Neurology

## 2025-01-19 ENCOUNTER — Encounter (HOSPITAL_COMMUNITY)

## 2025-02-23 ENCOUNTER — Ambulatory Visit: Admitting: Allergy and Immunology

## 2025-06-01 ENCOUNTER — Ambulatory Visit: Payer: Self-pay | Admitting: Neurology
